# Patient Record
Sex: Female | Born: 1985
Health system: Southern US, Community
[De-identification: ages and names within clinical notes are randomized; demographics above are authoritative.]

## PROBLEM LIST (undated history)

## (undated) DIAGNOSIS — I872 Venous insufficiency (chronic) (peripheral): Secondary | ICD-10-CM

## (undated) DIAGNOSIS — F419 Anxiety disorder, unspecified: Secondary | ICD-10-CM

## (undated) DIAGNOSIS — I10 Essential (primary) hypertension: Secondary | ICD-10-CM

## (undated) DIAGNOSIS — R1319 Other dysphagia: Secondary | ICD-10-CM

## (undated) DIAGNOSIS — M722 Plantar fascial fibromatosis: Secondary | ICD-10-CM

## (undated) DIAGNOSIS — E119 Type 2 diabetes mellitus without complications: Secondary | ICD-10-CM

## (undated) DIAGNOSIS — K219 Gastro-esophageal reflux disease without esophagitis: Secondary | ICD-10-CM

## (undated) HISTORY — PX: PLANTAR FASCIA RELEASE: SHX2239

## (undated) HISTORY — DX: Venous insufficiency (chronic) (peripheral): I87.2

## (undated) HISTORY — DX: Other dysphagia: R13.19

## (undated) HISTORY — DX: Type 2 diabetes mellitus without complications: E11.9

## (undated) HISTORY — PX: OTHER SURGICAL HISTORY: SHX169

## (undated) HISTORY — DX: Essential (primary) hypertension: I10

## (undated) HISTORY — DX: Anxiety disorder, unspecified: F41.9

## (undated) HISTORY — DX: Gastro-esophageal reflux disease without esophagitis: K21.9

## (undated) HISTORY — DX: Plantar fascial fibromatosis: M72.2

---

## 2010-06-09 ENCOUNTER — Other Ambulatory Visit: Payer: Self-pay | Admitting: Obstetrics and Gynecology

## 2010-06-09 DIAGNOSIS — N63 Unspecified lump in unspecified breast: Secondary | ICD-10-CM

## 2010-06-14 ENCOUNTER — Ambulatory Visit
Admission: RE | Admit: 2010-06-14 | Discharge: 2010-06-14 | Disposition: A | Discharge status: Home / Self Care | Payer: 59 | Source: Ambulatory Visit | Attending: Obstetrics and Gynecology | Admitting: Obstetrics and Gynecology

## 2010-06-14 DIAGNOSIS — N63 Unspecified lump in unspecified breast: Secondary | ICD-10-CM

## 2011-01-16 HISTORY — PX: WISDOM TOOTH EXTRACTION: SHX21

## 2013-02-10 ENCOUNTER — Other Ambulatory Visit (HOSPITAL_COMMUNITY): Payer: Self-pay | Admitting: Orthopedic Surgery

## 2013-02-10 DIAGNOSIS — M25562 Pain in left knee: Secondary | ICD-10-CM

## 2013-02-13 ENCOUNTER — Ambulatory Visit (HOSPITAL_COMMUNITY)
Admission: RE | Admit: 2013-02-13 | Discharge: 2013-02-13 | Disposition: A | Discharge status: Home / Self Care | Payer: 59 | Source: Ambulatory Visit | Attending: Orthopedic Surgery | Admitting: Orthopedic Surgery

## 2013-02-13 DIAGNOSIS — M25569 Pain in unspecified knee: Secondary | ICD-10-CM | POA: Insufficient documentation

## 2013-02-13 DIAGNOSIS — M25562 Pain in left knee: Secondary | ICD-10-CM

## 2013-02-13 DIAGNOSIS — M25469 Effusion, unspecified knee: Secondary | ICD-10-CM | POA: Insufficient documentation

## 2015-01-21 MED FILL — ATENOLOL 100 MG TABLET: 100 | 90 days supply | Qty: 90 | Fill #0 | Status: TO

## 2015-03-15 MED FILL — D-AMPHETAMINE ER 15 MG CAP: 15 | 30 days supply | Qty: 60 | Fill #0

## 2015-04-27 MED FILL — ATENOLOL 100 MG TABLET: 100 | 30 days supply | Qty: 30 | Fill #0

## 2015-05-19 DIAGNOSIS — E669 Obesity, unspecified: Secondary | ICD-10-CM | POA: Insufficient documentation

## 2015-05-19 DIAGNOSIS — N970 Female infertility associated with anovulation: Secondary | ICD-10-CM | POA: Diagnosis not present

## 2015-05-19 DIAGNOSIS — F419 Anxiety disorder, unspecified: Secondary | ICD-10-CM | POA: Insufficient documentation

## 2015-05-19 DIAGNOSIS — F411 Generalized anxiety disorder: Secondary | ICD-10-CM | POA: Insufficient documentation

## 2015-05-19 DIAGNOSIS — I10 Essential (primary) hypertension: Secondary | ICD-10-CM | POA: Diagnosis not present

## 2015-05-19 DIAGNOSIS — Z13220 Encounter for screening for lipoid disorders: Secondary | ICD-10-CM | POA: Diagnosis not present

## 2015-05-19 DIAGNOSIS — Z7689 Persons encountering health services in other specified circumstances: Secondary | ICD-10-CM | POA: Diagnosis not present

## 2015-05-19 HISTORY — DX: Female infertility associated with anovulation: N97.0

## 2015-05-19 MED FILL — SERTRALINE HCL 50 MG TABLET: 50 | 30 days supply | Qty: 30 | Fill #0

## 2015-05-19 MED FILL — metFORMIN HCL 500 MG TABS: 500 | 30 days supply | Qty: 60 | Fill #0

## 2015-05-20 MED FILL — ATENOLOL 100 MG TABLET: 100 | 90 days supply | Qty: 90 | Fill #0

## 2015-05-23 MED FILL — D-AMPHETAMINE ER 15 MG CAP: 15 | 90 days supply | Qty: 90 | Fill #0

## 2015-07-26 DIAGNOSIS — N951 Menopausal and female climacteric states: Secondary | ICD-10-CM | POA: Diagnosis not present

## 2015-07-26 DIAGNOSIS — R635 Abnormal weight gain: Secondary | ICD-10-CM | POA: Diagnosis not present

## 2015-08-02 DIAGNOSIS — E7800 Pure hypercholesterolemia, unspecified: Secondary | ICD-10-CM | POA: Diagnosis not present

## 2015-08-02 DIAGNOSIS — I10 Essential (primary) hypertension: Secondary | ICD-10-CM | POA: Diagnosis not present

## 2015-08-02 DIAGNOSIS — E538 Deficiency of other specified B group vitamins: Secondary | ICD-10-CM | POA: Diagnosis not present

## 2015-08-09 DIAGNOSIS — E538 Deficiency of other specified B group vitamins: Secondary | ICD-10-CM | POA: Diagnosis not present

## 2015-08-09 DIAGNOSIS — I10 Essential (primary) hypertension: Secondary | ICD-10-CM | POA: Diagnosis not present

## 2015-08-09 DIAGNOSIS — R7303 Prediabetes: Secondary | ICD-10-CM | POA: Diagnosis not present

## 2015-08-12 MED FILL — SERTRALINE HCL 50 MG TABLET: 50 | 30 days supply | Qty: 30 | Fill #1

## 2015-08-12 MED FILL — metFORMIN HCL 500 MG TABS: 500 | 30 days supply | Qty: 60 | Fill #1

## 2015-08-23 DIAGNOSIS — E7800 Pure hypercholesterolemia, unspecified: Secondary | ICD-10-CM | POA: Diagnosis not present

## 2015-08-23 DIAGNOSIS — I10 Essential (primary) hypertension: Secondary | ICD-10-CM | POA: Diagnosis not present

## 2015-08-23 DIAGNOSIS — E538 Deficiency of other specified B group vitamins: Secondary | ICD-10-CM | POA: Diagnosis not present

## 2015-08-30 DIAGNOSIS — R7303 Prediabetes: Secondary | ICD-10-CM | POA: Diagnosis not present

## 2015-08-30 DIAGNOSIS — E538 Deficiency of other specified B group vitamins: Secondary | ICD-10-CM | POA: Diagnosis not present

## 2015-08-30 DIAGNOSIS — E7800 Pure hypercholesterolemia, unspecified: Secondary | ICD-10-CM | POA: Diagnosis not present

## 2015-08-30 MED FILL — ATENOLOL 100 MG TABLET: 100 | 90 days supply | Qty: 90 | Fill #1

## 2015-09-02 DIAGNOSIS — I10 Essential (primary) hypertension: Secondary | ICD-10-CM | POA: Diagnosis not present

## 2015-09-02 DIAGNOSIS — F909 Attention-deficit hyperactivity disorder, unspecified type: Secondary | ICD-10-CM | POA: Insufficient documentation

## 2015-09-02 DIAGNOSIS — G47429 Narcolepsy in conditions classified elsewhere without cataplexy: Secondary | ICD-10-CM | POA: Insufficient documentation

## 2015-09-02 DIAGNOSIS — F411 Generalized anxiety disorder: Secondary | ICD-10-CM | POA: Diagnosis not present

## 2015-09-02 MED FILL — METHYLDOPA 250 MG TABLET: 250 | 30 days supply | Qty: 60 | Fill #0

## 2015-09-02 MED FILL — SERTRALINE HCL 50 MG TABLET: 50 | 30 days supply | Qty: 60 | Fill #0

## 2015-09-06 MED FILL — D-AMPHETAMINE ER 15 MG CAP: 15 | 14 days supply | Qty: 14 | Fill #0

## 2015-09-13 DIAGNOSIS — E538 Deficiency of other specified B group vitamins: Secondary | ICD-10-CM | POA: Diagnosis not present

## 2015-09-13 DIAGNOSIS — E7800 Pure hypercholesterolemia, unspecified: Secondary | ICD-10-CM | POA: Diagnosis not present

## 2015-09-13 DIAGNOSIS — I10 Essential (primary) hypertension: Secondary | ICD-10-CM | POA: Diagnosis not present

## 2015-09-16 DIAGNOSIS — G47429 Narcolepsy in conditions classified elsewhere without cataplexy: Secondary | ICD-10-CM | POA: Diagnosis not present

## 2015-09-16 DIAGNOSIS — I10 Essential (primary) hypertension: Secondary | ICD-10-CM | POA: Diagnosis not present

## 2015-09-20 MED FILL — D-AMPHETAMINE ER 15 MG CAP: 15 | 21 days supply | Qty: 21 | Fill #0

## 2015-09-22 MED FILL — METHYLDOPA 250 MG TABLET: 250 | 30 days supply | Qty: 180 | Fill #0

## 2015-09-23 DIAGNOSIS — N979 Female infertility, unspecified: Secondary | ICD-10-CM | POA: Diagnosis not present

## 2015-09-30 DIAGNOSIS — F411 Generalized anxiety disorder: Secondary | ICD-10-CM | POA: Diagnosis not present

## 2015-09-30 DIAGNOSIS — I10 Essential (primary) hypertension: Secondary | ICD-10-CM | POA: Diagnosis not present

## 2015-09-30 MED FILL — METOPROLOL SUCC ER 100 MG T: 100 | 30 days supply | Qty: 30 | Fill #0 | Status: TO

## 2015-10-06 DIAGNOSIS — G47419 Narcolepsy without cataplexy: Secondary | ICD-10-CM | POA: Insufficient documentation

## 2015-10-06 DIAGNOSIS — F411 Generalized anxiety disorder: Secondary | ICD-10-CM | POA: Diagnosis not present

## 2015-10-06 DIAGNOSIS — R0683 Snoring: Secondary | ICD-10-CM | POA: Insufficient documentation

## 2015-10-06 DIAGNOSIS — G4719 Other hypersomnia: Secondary | ICD-10-CM | POA: Insufficient documentation

## 2015-10-06 DIAGNOSIS — F988 Other specified behavioral and emotional disorders with onset usually occurring in childhood and adolescence: Secondary | ICD-10-CM | POA: Insufficient documentation

## 2015-10-06 DIAGNOSIS — G47429 Narcolepsy in conditions classified elsewhere without cataplexy: Secondary | ICD-10-CM | POA: Diagnosis not present

## 2015-10-06 MED FILL — D-AMPHETAMINE ER 15 MG CAP: 15 | 30 days supply | Qty: 30 | Fill #0

## 2015-10-10 DIAGNOSIS — N979 Female infertility, unspecified: Secondary | ICD-10-CM | POA: Diagnosis not present

## 2015-10-12 ENCOUNTER — Other Ambulatory Visit (HOSPITAL_BASED_OUTPATIENT_CLINIC_OR_DEPARTMENT_OTHER): Payer: Self-pay

## 2015-10-12 DIAGNOSIS — R5383 Other fatigue: Secondary | ICD-10-CM

## 2015-10-12 DIAGNOSIS — G478 Other sleep disorders: Secondary | ICD-10-CM

## 2015-10-12 DIAGNOSIS — R0683 Snoring: Secondary | ICD-10-CM

## 2015-10-12 DIAGNOSIS — G47419 Narcolepsy without cataplexy: Secondary | ICD-10-CM

## 2015-10-12 DIAGNOSIS — G4710 Hypersomnia, unspecified: Secondary | ICD-10-CM

## 2015-10-12 NOTE — Progress Notes (Signed)
Multi

## 2015-10-14 DIAGNOSIS — I10 Essential (primary) hypertension: Secondary | ICD-10-CM | POA: Diagnosis not present

## 2015-10-18 MED FILL — AMLODIPINE BESYLATE 5 MG TA: 5 | 30 days supply | Qty: 30 | Fill #0

## 2015-10-21 ENCOUNTER — Other Ambulatory Visit (HOSPITAL_COMMUNITY): Payer: Self-pay | Admitting: Obstetrics & Gynecology

## 2015-10-21 DIAGNOSIS — Z3141 Encounter for fertility testing: Secondary | ICD-10-CM

## 2015-10-28 ENCOUNTER — Encounter (INDEPENDENT_AMBULATORY_CARE_PROVIDER_SITE_OTHER): Payer: Self-pay

## 2015-10-28 ENCOUNTER — Ambulatory Visit (HOSPITAL_COMMUNITY): Payer: 59

## 2015-10-28 ENCOUNTER — Ambulatory Visit (HOSPITAL_COMMUNITY)
Admission: RE | Admit: 2015-10-28 | Discharge: 2015-10-28 | Disposition: A | Discharge status: Home / Self Care | Payer: 59 | Source: Ambulatory Visit | Attending: Obstetrics & Gynecology | Admitting: Obstetrics & Gynecology

## 2015-10-28 DIAGNOSIS — Z3141 Encounter for fertility testing: Secondary | ICD-10-CM | POA: Insufficient documentation

## 2015-10-28 MED ORDER — IOPAMIDOL (ISOVUE-300) INJECTION 61%
30.0000 mL | Freq: Once | INTRAVENOUS | Status: AC | PRN
  Administered 2015-10-28: 3 mL

## 2015-10-31 MED FILL — METOPROLOL SUCC ER 100 MG T: 100 | 30 days supply | Qty: 30 | Fill #0

## 2015-11-16 DIAGNOSIS — N979 Female infertility, unspecified: Secondary | ICD-10-CM | POA: Diagnosis not present

## 2015-11-16 MED FILL — CLOMIPHENE CITRATE 50 MG TA: 50 | 5 days supply | Qty: 5 | Fill #0

## 2015-11-17 MED FILL — SERTRALINE HCL 50 MG TABLET: 50 | 30 days supply | Qty: 60 | Fill #1

## 2015-11-17 MED FILL — AMLODIPINE BESYLATE 5 MG TA: 5 | 30 days supply | Qty: 30 | Fill #1

## 2015-11-21 ENCOUNTER — Ambulatory Visit (HOSPITAL_BASED_OUTPATIENT_CLINIC_OR_DEPARTMENT_OTHER): Payer: 59 | Attending: Internal Medicine | Admitting: Internal Medicine

## 2015-11-21 DIAGNOSIS — G4753 Recurrent isolated sleep paralysis: Secondary | ICD-10-CM | POA: Insufficient documentation

## 2015-11-21 DIAGNOSIS — G47419 Narcolepsy without cataplexy: Secondary | ICD-10-CM | POA: Diagnosis not present

## 2015-11-21 DIAGNOSIS — G478 Other sleep disorders: Secondary | ICD-10-CM

## 2015-11-21 DIAGNOSIS — R5383 Other fatigue: Secondary | ICD-10-CM | POA: Diagnosis not present

## 2015-11-21 DIAGNOSIS — R400 Somnolence: Secondary | ICD-10-CM | POA: Diagnosis not present

## 2015-11-21 DIAGNOSIS — R0683 Snoring: Secondary | ICD-10-CM | POA: Insufficient documentation

## 2015-11-21 DIAGNOSIS — G4710 Hypersomnia, unspecified: Secondary | ICD-10-CM

## 2015-11-22 ENCOUNTER — Ambulatory Visit (HOSPITAL_BASED_OUTPATIENT_CLINIC_OR_DEPARTMENT_OTHER): Payer: 59 | Attending: Internal Medicine | Admitting: Internal Medicine

## 2015-11-22 DIAGNOSIS — R5383 Other fatigue: Secondary | ICD-10-CM

## 2015-11-22 DIAGNOSIS — R0683 Snoring: Secondary | ICD-10-CM

## 2015-11-22 DIAGNOSIS — G47419 Narcolepsy without cataplexy: Secondary | ICD-10-CM

## 2015-11-22 DIAGNOSIS — G478 Other sleep disorders: Secondary | ICD-10-CM

## 2015-11-22 DIAGNOSIS — G4710 Hypersomnia, unspecified: Secondary | ICD-10-CM

## 2015-11-27 DIAGNOSIS — R0683 Snoring: Secondary | ICD-10-CM

## 2015-11-27 NOTE — Procedures (Signed)
  Patient Name: Cheryl Ayala, [place] Date: 11/21/2015 Gender: Female D.O.B: [birth year] Age (years): 30 Referring Provider: Abner Greenspan MD Height (inches): 67 Interpreting Physician: Baird Lyons MD, ABSM Weight (lbs): 284 RPSGT: [place] BMI: 44 MRN: [id number] Neck Size: 16.00 CLINICAL INFORMATION Sleep Study Type: NPSG Indication for sleep study: Daytime Fatigue, Fatigue, Snoring Epworth Sleepiness Score: 14  SLEEP STUDY TECHNIQUE As per the AASM Manual for the Scoring of Sleep and Associated Events v2.3 (April 2016) with a hypopnea requiring 4% desaturations. The channels recorded and monitored were frontal, central and occipital EEG, electrooculogram (EOG), submentalis EMG (chin), nasal and oral airflow, thoracic and abdominal wall motion, anterior tibialis EMG, snore microphone, electrocardiogram, and pulse oximetry.  MEDICATIONS Medications self-administered by patient taken the night of the study : none reported  SLEEP ARCHITECTURE The study was initiated at 9:57:29 PM and ended at 6:00:24 AM. Sleep onset time was 22.8 minutes and the sleep efficiency was 87.2%. The total sleep time was 421.2 minutes. Stage REM latency was 161.0 minutes. The patient spent 1.78% of the night in stage N1 sleep, 85.04% in stage N2 sleep, 0.00% in stage N3 and 13.18% in REM. Alpha intrusion was absent. Supine sleep was 29.95%. Wake after sleep onset 39 minutes  RESPIRATORY PARAMETERS The overall apnea/hypopnea index (AHI) was 1.6 per hour. There were 2 total apneas, including 2 obstructive, 0 central and 0 mixed apneas. There were 9 hypopneas and 2 RERAs. The AHI during Stage REM sleep was 9.7 per hour. AHI while supine was 0.5 per hour. The mean oxygen saturation was 94.55%. The minimum SpO2 during sleep was 84.00%. Moderate snoring was noted during this study.  CARDIAC DATA The 2 lead EKG demonstrated sinus rhythm. The mean heart rate was 64.49 beats per minute.  Other EKG findings include: None.  LEG MOVEMENT DATA The total PLMS were 0 with a resulting PLMS index of 0.00. Associated arousal with leg movement index was 0.0 .  IMPRESSIONS - No significant obstructive sleep apnea occurred during this study (AHI = 1.6/h). - No significant central sleep apnea occurred during this study (CAI = 0.0/h). - Mild oxygen desaturation was noted during this study (Min O2 = 84.00%). - The patient snored with Moderate snoring volume. - No cardiac abnormalities were noted during this study. - Clinically significant periodic limb movements did not occur during sleep. No significant associated arousals.  DIAGNOSIS - Primary Snoring (786.09 [R06.83 ICD-10])  RECOMMENDATIONS - Avoid alcohol, sedatives and other CNS depressants that may worsen sleep apnea and disrupt normal sleep architecture. - Sleep hygiene should be reviewed to assess factors that may improve sleep quality. - Weight management and regular exercise should be initiated or continued if appropriate.  [Electronically signed] 11/27/2015 10:45 AM  Baird Lyons MD, ABSM Diplomate, American Board of Sleep Medicine   NPI: [id number]  [place], American Board of Sleep Medicine  ELECTRONICALLY SIGNED ON:  11/27/2015, 10:42 AM [place] PH: [phone]   FX: [phone] [place]

## 2015-11-27 NOTE — Procedures (Signed)
    Patient Name: Cheryl Ayala, Cheryl Ayala Date: 11/22/2015 Gender: Female D.O.B: 1985-04-17 Age (years): 30 Referring Provider: Abner Greenspan MD Height (inches): 18 Interpreting Physician: Baird Lyons MD, ABSM Weight (lbs): 284 RPSGT: [place], [place] BMI: 44 MRN: [id number] Neck Size: 16.00 CLINICAL INFORMATION Sleep Study Type: MSLT The patient was referred to the sleep center for evaluation of daytime sleepiness. Epworth Sleepiness Score: 14  SLEEP STUDY TECHNIQUE A Multiple Sleep Latency Test was performed after an overnight polysomnogram according to the AASM scoring manual v2.3 (April 2016) and clinical guidelines. Five nap opportunities occurred over the course of the test which followed an overnight polysomnogram. The channels recorded and monitored were frontal, central, and occipital electroencephalography (EEG), right and left electrooculogram (EOG), chin electromyography (EMG), and electrocardiogram (EKG).  MEDICATIONS Medications taken by the patient : none reported Medications administered by patient during sleep study : No sleep medicine administered.  IMPRESSIONS - Total number of naps attempted: 5.00 . Total number of naps with sleep attained: 5.00. The Mean Sleep Latency was 0.55 minutes. There were No sleep-onset REM periods. - The patient appears to have pathologic sleepiness, evidenced by a short mean sleep latency (8 minutes or less) on this MSLT. - No sleep onset REMs were noted during this MSLT.  DIAGNOSIS - Pathologic Sleepiness (- [G47.10 ICD-10])  RECOMMENDATIONS - Return tp provider for management of pathologic sleepiness  [Electronically signed] 11/27/2015 11:00 AM  Baird Lyons MD, ABSM Diplomate, American Board of Sleep Medicine   NPI: [id number]  [place], American Board of Sleep Medicine  ELECTRONICALLY SIGNED ON:  11/27/2015, 10:51 AM [place] PH: [phone]   FX: [phone]  [phone] [place]

## 2015-11-28 MED FILL — METOPROLOL SUCC ER 100 MG T: 100 | 30 days supply | Qty: 30 | Fill #0

## 2015-11-28 MED FILL — D-AMPHETAMINE ER 15 MG CAP: 15 | 30 days supply | Qty: 30 | Fill #0

## 2015-12-02 DIAGNOSIS — Z6841 Body Mass Index (BMI) 40.0 and over, adult: Secondary | ICD-10-CM | POA: Diagnosis not present

## 2015-12-02 DIAGNOSIS — Z01419 Encounter for gynecological examination (general) (routine) without abnormal findings: Secondary | ICD-10-CM | POA: Diagnosis not present

## 2015-12-16 DIAGNOSIS — J029 Acute pharyngitis, unspecified: Secondary | ICD-10-CM | POA: Diagnosis not present

## 2015-12-16 DIAGNOSIS — J019 Acute sinusitis, unspecified: Secondary | ICD-10-CM | POA: Diagnosis not present

## 2015-12-16 MED FILL — levoFLOXacin 500 MG TABS: 500 | 10 days supply | Qty: 10 | Fill #0

## 2015-12-23 DIAGNOSIS — F32A Depression, unspecified: Secondary | ICD-10-CM | POA: Insufficient documentation

## 2015-12-23 DIAGNOSIS — F902 Attention-deficit hyperactivity disorder, combined type: Secondary | ICD-10-CM | POA: Diagnosis not present

## 2015-12-23 DIAGNOSIS — G4710 Hypersomnia, unspecified: Secondary | ICD-10-CM | POA: Diagnosis not present

## 2015-12-23 DIAGNOSIS — F329 Major depressive disorder, single episode, unspecified: Secondary | ICD-10-CM | POA: Diagnosis not present

## 2015-12-23 DIAGNOSIS — G4719 Other hypersomnia: Secondary | ICD-10-CM | POA: Diagnosis not present

## 2015-12-23 DIAGNOSIS — F331 Major depressive disorder, recurrent, moderate: Secondary | ICD-10-CM | POA: Insufficient documentation

## 2015-12-23 HISTORY — DX: Hypersomnia, unspecified: G47.10

## 2015-12-26 MED FILL — AMLODIPINE BESYLATE 5 MG TA: 5 | 30 days supply | Qty: 30 | Fill #0 | Status: TO

## 2015-12-26 MED FILL — CLOMIPHENE CITRATE 50 MG TA: 50 | 5 days supply | Qty: 5 | Fill #0

## 2015-12-26 MED FILL — METOPROLOL SUCC ER 100 MG T: 100 | 30 days supply | Qty: 30 | Fill #1 | Status: TO

## 2016-01-02 MED FILL — D-AMPHETAMINE ER 15 MG CAP: 15 | 30 days supply | Qty: 30 | Fill #0

## 2016-01-27 MED FILL — AMLODIPINE BESYLATE 5 MG TA: 5 | 30 days supply | Qty: 30 | Fill #0

## 2016-01-27 MED FILL — METOPROLOL SUCC ER 100 MG T: 100 | 30 days supply | Qty: 30 | Fill #0

## 2016-01-27 MED FILL — SERTRALINE HCL 100 MG TAB: 100 | 90 days supply | Qty: 90 | Fill #0

## 2016-02-06 DIAGNOSIS — Z3149 Encounter for other procreative investigation and testing: Secondary | ICD-10-CM | POA: Diagnosis not present

## 2016-02-06 MED FILL — D-AMPHETAMINE ER 15 MG CAP: 15 | 30 days supply | Qty: 30 | Fill #0

## 2016-03-07 MED FILL — METOPROLOL SUCC ER 100 MG T: 100 | 30 days supply | Qty: 30 | Fill #1

## 2016-03-07 MED FILL — AMLODIPINE BESYLATE 5 MG TA: 5 | 30 days supply | Qty: 30 | Fill #0

## 2016-03-13 MED FILL — D-AMPHETAMINE ER 15 MG CAP: 15 | 30 days supply | Qty: 30 | Fill #0

## 2016-03-13 MED FILL — CLOMIPHENE CITRATE 50 MG TA: 50 | 30 days supply | Qty: 5 | Fill #0

## 2016-04-03 DIAGNOSIS — Z3149 Encounter for other procreative investigation and testing: Secondary | ICD-10-CM | POA: Diagnosis not present

## 2016-04-04 MED FILL — AMLODIPINE BESYLATE 5 MG TA: 5 | 30 days supply | Qty: 30 | Fill #0

## 2016-04-04 MED FILL — METOPROLOL SUCC ER 100 MG T: 100 | 30 days supply | Qty: 30 | Fill #0

## 2016-04-12 MED FILL — CLOMIPHENE CITRATE 50 MG TA: 50 | 5 days supply | Qty: 5 | Fill #0

## 2016-04-18 MED FILL — D-AMPHETAMINE ER 15 MG CAP: 15 | 30 days supply | Qty: 30 | Fill #0

## 2016-04-20 DIAGNOSIS — I10 Essential (primary) hypertension: Secondary | ICD-10-CM | POA: Diagnosis not present

## 2016-04-20 DIAGNOSIS — F411 Generalized anxiety disorder: Secondary | ICD-10-CM | POA: Diagnosis not present

## 2016-05-14 MED FILL — CLOMIPHENE CITRATE 50 MG TA: 50 | 30 days supply | Qty: 5 | Fill #0

## 2016-05-15 MED FILL — AMLODIPINE BESYLATE 5 MG TA: 5 | 90 days supply | Qty: 90 | Fill #0

## 2016-05-15 MED FILL — METOPROLOL SUCC ER 100 MG T: 100 | 90 days supply | Qty: 90 | Fill #0

## 2016-05-28 MED FILL — D-AMPHETAMINE ER 15 MG CAP: 15 | 30 days supply | Qty: 30 | Fill #0

## 2016-05-28 MED FILL — SERTRALINE HCL 100 MG TAB: 100 | 90 days supply | Qty: 90 | Fill #0

## 2016-06-22 DIAGNOSIS — G4710 Hypersomnia, unspecified: Secondary | ICD-10-CM | POA: Diagnosis not present

## 2016-06-28 MED FILL — D-AMPHETAMINE ER 15 MG CAP: 15 | 30 days supply | Qty: 30 | Fill #0

## 2016-07-05 DIAGNOSIS — N979 Female infertility, unspecified: Secondary | ICD-10-CM | POA: Diagnosis not present

## 2016-07-06 MED FILL — LETROZOLE 2.5 MG TABLET: 2.5 | 5 days supply | Qty: 15 | Fill #0 | Status: TO

## 2016-07-06 MED FILL — PREGNYL 10,000 UNITS VIAL: 10000 | 1 days supply | Qty: 1 | Fill #0

## 2016-07-17 DIAGNOSIS — Z3141 Encounter for fertility testing: Secondary | ICD-10-CM | POA: Diagnosis not present

## 2016-08-10 MED FILL — D-AMPHETAMINE ER 15 MG CAP: 15 | 30 days supply | Qty: 30 | Fill #0

## 2016-08-30 MED FILL — SERTRALINE HCL 100 MG TAB: 100 | 90 days supply | Qty: 90 | Fill #1

## 2016-08-30 MED FILL — LETROZOLE 2.5 MG TABLET: 2.5 | 5 days supply | Qty: 15 | Fill #0

## 2016-08-30 MED FILL — AMLODIPINE BESYLATE 5 MG TA: 5 | 90 days supply | Qty: 90 | Fill #1

## 2016-08-30 MED FILL — METOPROLOL SUCC ER 100 MG T: 100 | 90 days supply | Qty: 90 | Fill #1

## 2016-09-27 MED FILL — D-AMPHETAMINE ER 15 MG CAP: 15 | 30 days supply | Qty: 30 | Fill #0

## 2016-10-17 ENCOUNTER — Telehealth: Payer: Self-pay | Admitting: Allergy & Immunology

## 2016-10-17 MED ORDER — DOXYCYCLINE HYCLATE 100 MG PO TABS: 100.0000 mg | ORAL_TABLET | Freq: Two times a day (BID) | ORAL | 0 refills | Status: AC

## 2016-10-17 MED FILL — DOXYCYCLINE HYCLATE 100 MG: 100 | 10 days supply | Qty: 20 | Fill #0

## 2016-10-17 NOTE — Telephone Encounter (Signed)
Patient called me to discuss two weeks of chronic congestion that seems to be worsening. She has tried OTC treatments without improvement. Therefore, given the course, we will send in antibiotics: doxycyline 100mg  BID for ten days. Patient in agreement with the plan.  Salvatore Marvel, MD [facility] [place] [facility] [place]

## 2016-11-01 MED FILL — LETROZOLE 2.5 MG TABLET: 2.5 | 5 days supply | Qty: 15 | Fill #1

## 2016-11-07 MED FILL — D-AMPHETAMINE ER 15 MG CAP: 15 | 30 days supply | Qty: 30 | Fill #0

## 2016-11-30 MED FILL — SERTRALINE HCL 100 MG TAB: 100 | 30 days supply | Qty: 30 | Fill #0

## 2016-12-12 ENCOUNTER — Ambulatory Visit (INDEPENDENT_AMBULATORY_CARE_PROVIDER_SITE_OTHER): Payer: 59

## 2016-12-12 ENCOUNTER — Encounter: Payer: Self-pay | Admitting: Podiatry

## 2016-12-12 ENCOUNTER — Ambulatory Visit: Payer: 59 | Admitting: Podiatry

## 2016-12-12 DIAGNOSIS — M722 Plantar fascial fibromatosis: Secondary | ICD-10-CM

## 2016-12-12 MED ORDER — TRIAMCINOLONE ACETONIDE 10 MG/ML IJ SUSP
10.0000 mg | Freq: Once | INTRAMUSCULAR | Status: AC
  Administered 2016-12-12: 10 mg

## 2016-12-12 MED ORDER — DICLOFENAC SODIUM 75 MG PO TBEC: 75.0000 mg | DELAYED_RELEASE_TABLET | Freq: Two times a day (BID) | ORAL | 2 refills | Status: DC

## 2016-12-12 NOTE — Progress Notes (Signed)
Subjective:    Patient ID: Cheryl Ayala, female   DOB: 31 y.o.   MRN: [id number]   HPI patient presents stating she has a lot of pain in the bottom of both heels and that it's been going on for a fairly long period of time and has worsened in the left over right recently. Patient does not smoke is moderately obese and likes to be active    Review of Systems  All other systems reviewed and are negative.       Objective:  Physical Exam  Constitutional: She appears well-developed and well-nourished.  Cardiovascular: Intact distal pulses.  Pulmonary/Chest: Effort normal.  Musculoskeletal: Normal range of motion.  Neurological: She is alert.  Skin: Skin is warm.  Nursing note and vitals reviewed.  neurovascular status intact muscle strength adequate range of motion within normal limits with patient noted to have exquisite discomfort plantar aspect heel left over right with inflammation and fluid around the medial band     Assessment:   Acute plantar fasciitis bilateral with moderate depression of the arch      Plan:  H&P condition reviewed and injected the plantar fascia bilateral 3 mg Kenalog 5 mg Xylocaine and applied fascial brace bilateral and placed on diclofenac 75 mg twice a day and gave instructions on long-term orthotic therapy. Reappoint to recheck in 2 weeks  X-rays indicate there is spur formation left and right heel with no indication of stress fracture

## 2016-12-12 NOTE — Patient Instructions (Signed)

## 2016-12-14 MED FILL — D-AMPHETAMINE ER 15 MG CAP: 15 | 30 days supply | Qty: 30 | Fill #0

## 2016-12-14 MED FILL — DICLOFENAC SODIUM 75 MG TAB: 75 | 25 days supply | Qty: 50 | Fill #0

## 2016-12-14 MED FILL — AMLODIPINE BESYLATE 5 MG TA: 5 | 30 days supply | Qty: 30 | Fill #0

## 2016-12-14 MED FILL — METOPROLOL SUCC ER 100 MG T: 100 | 30 days supply | Qty: 30 | Fill #0

## 2016-12-26 ENCOUNTER — Ambulatory Visit (INDEPENDENT_AMBULATORY_CARE_PROVIDER_SITE_OTHER): Payer: 59 | Admitting: Podiatry

## 2016-12-26 ENCOUNTER — Encounter: Payer: Self-pay | Admitting: Podiatry

## 2016-12-26 DIAGNOSIS — M722 Plantar fascial fibromatosis: Secondary | ICD-10-CM

## 2016-12-27 NOTE — Progress Notes (Signed)
Subjective:   Patient ID: Cheryl Ayala, female   DOB: 31 y.o.   MRN: [id number]   HPI Patient states that she is quite a bit improved but she still has pain if she does too much and she has had this problem for a long time.   ROS      Objective:  Physical Exam  Neurovascular status intact with patient's foot much improved from previous visit with discomfort still upon deep palpation.     Assessment:  Chronic fasciitis with depression of the arch is complicating factor in young girl who has had this for a fairly long time.     Plan:  H&P and conditions reviewed with patient.  I recommended long-term orthotics in order to support the plantar arch and take stress off the heel and patient wants these made in a scan for customized orthotic devices at the current time

## 2016-12-28 DIAGNOSIS — G47429 Narcolepsy in conditions classified elsewhere without cataplexy: Secondary | ICD-10-CM | POA: Diagnosis not present

## 2016-12-28 DIAGNOSIS — F329 Major depressive disorder, single episode, unspecified: Secondary | ICD-10-CM | POA: Diagnosis not present

## 2016-12-28 DIAGNOSIS — E7800 Pure hypercholesterolemia, unspecified: Secondary | ICD-10-CM | POA: Diagnosis not present

## 2016-12-28 DIAGNOSIS — F909 Attention-deficit hyperactivity disorder, unspecified type: Secondary | ICD-10-CM | POA: Diagnosis not present

## 2016-12-28 DIAGNOSIS — I10 Essential (primary) hypertension: Secondary | ICD-10-CM | POA: Diagnosis not present

## 2016-12-28 DIAGNOSIS — E785 Hyperlipidemia, unspecified: Secondary | ICD-10-CM | POA: Diagnosis not present

## 2016-12-28 MED FILL — PREGNYL 10,000 UNITS VIAL: 10000 | 1 days supply | Qty: 1 | Fill #0

## 2017-01-01 DIAGNOSIS — Z3141 Encounter for fertility testing: Secondary | ICD-10-CM | POA: Diagnosis not present

## 2017-01-04 DIAGNOSIS — Z3169 Encounter for other general counseling and advice on procreation: Secondary | ICD-10-CM | POA: Diagnosis not present

## 2017-01-21 ENCOUNTER — Telehealth: Payer: Self-pay | Admitting: Podiatry

## 2017-01-21 NOTE — Telephone Encounter (Signed)
Pt left voicemail asking about orthotics.  Called pt back and made her aware that we have ordered them just waiting on them to come in and I would call when they do. Hoping for this week

## 2017-01-22 MED FILL — AMLODIPINE BESYLATE 5 MG TA: 5 | 90 days supply | Qty: 90 | Fill #0

## 2017-01-22 MED FILL — METOPROLOL SUCC ER 100 MG T: 100 | 90 days supply | Qty: 90 | Fill #0

## 2017-01-22 MED FILL — D-AMPHETAMINE ER 15 MG CAP: 15 | 30 days supply | Qty: 30 | Fill #0

## 2017-01-22 MED FILL — SERTRALINE HCL 100 MG TAB: 100 | 90 days supply | Qty: 135 | Fill #0

## 2017-02-05 ENCOUNTER — Ambulatory Visit: Payer: 59 | Admitting: Orthotics

## 2017-02-05 DIAGNOSIS — M722 Plantar fascial fibromatosis: Secondary | ICD-10-CM

## 2017-02-05 NOTE — Progress Notes (Signed)
Patient came in today to pick up custom made foot orthotics.  The goals were accomplished and the patient reported no dissatisfaction with said orthotics.  Patient was advised of breakin period and how to report any issues. 

## 2017-03-05 MED FILL — D-AMPHETAMINE ER 15 MG CAP: 15 | 30 days supply | Qty: 30 | Fill #0

## 2017-04-12 DIAGNOSIS — I10 Essential (primary) hypertension: Secondary | ICD-10-CM | POA: Diagnosis not present

## 2017-04-12 DIAGNOSIS — R609 Edema, unspecified: Secondary | ICD-10-CM | POA: Diagnosis not present

## 2017-04-12 DIAGNOSIS — G47429 Narcolepsy in conditions classified elsewhere without cataplexy: Secondary | ICD-10-CM | POA: Diagnosis not present

## 2017-04-12 MED FILL — HYDROCHLOROTHIAZIDE 12.5 MG: 12.5 | 30 days supply | Qty: 30 | Fill #0

## 2017-04-18 MED FILL — D-AMPHETAMINE ER 15 MG CAP: 15 | 30 days supply | Qty: 30 | Fill #0

## 2017-04-23 MED FILL — METOPROLOL SUCCINATE ER 100: 100 | 90 days supply | Qty: 90 | Fill #1

## 2017-04-23 MED FILL — AMLODIPINE BESYLATE 5 MG TA: 5 | 90 days supply | Qty: 90 | Fill #1

## 2017-04-24 DIAGNOSIS — I10 Essential (primary) hypertension: Secondary | ICD-10-CM | POA: Diagnosis not present

## 2017-04-24 DIAGNOSIS — E7800 Pure hypercholesterolemia, unspecified: Secondary | ICD-10-CM | POA: Diagnosis not present

## 2017-05-08 ENCOUNTER — Encounter: Payer: Self-pay | Admitting: Nurse Practitioner

## 2017-05-08 ENCOUNTER — Ambulatory Visit (INDEPENDENT_AMBULATORY_CARE_PROVIDER_SITE_OTHER): Payer: 59 | Admitting: Nurse Practitioner

## 2017-05-08 ENCOUNTER — Ambulatory Visit: Payer: Self-pay

## 2017-05-08 VITALS — BP 128/90 | HR 71 | Temp 98.2°F | Ht 67.0 in | Wt 306.8 lb

## 2017-05-08 DIAGNOSIS — R600 Localized edema: Secondary | ICD-10-CM | POA: Diagnosis not present

## 2017-05-08 DIAGNOSIS — I10 Essential (primary) hypertension: Secondary | ICD-10-CM

## 2017-05-08 MED ORDER — LABETALOL HCL 200 MG PO TABS: 200.0000 mg | ORAL_TABLET | Freq: Two times a day (BID) | ORAL | 0 refills | Status: DC

## 2017-05-08 MED FILL — FUROSEMIDE 20 MG TABS: 20 | 30 days supply | Qty: 30 | Fill #0

## 2017-05-08 MED FILL — LABETALOL HCL 200 MG TABLET: 200 | 14 days supply | Qty: 28 | Fill #0

## 2017-05-08 NOTE — Progress Notes (Signed)
Subjective:  Patient ID: Cheryl Ayala, female    DOB: 03-25-1985  Age: 32 y.o. MRN: [id number]  CC: Establish Care (4 months swelling in lower extremities...more so on left side. ...gets worse during the daytime hours.)  HPI  Reports labs last done 04/2017: CBC (normal), CMP (normal), lipid panel (abnormal). TSH last done 2017 (normal)  Bilateral LE edema: Onset 56months ago. Waxing and waning.  some improvements with elevation and use of compression stocking. No change with HCTZ 12.5mg . Furosemide was prescribed 3weeks ago, but did not start medication. Does not maintain low sodium diet. Does not exercise. Denies any change in medications in last 20months.  HTN: Controlled with use of metoprolol and amlodipine for several years. Use of atenolol in past. She is trying to get pregnant at this time. No hormone or fertility medication at this time per patient. BP Readings from Last 3 Encounters:  05/08/17 128/90   Hypersomnia: Sleep study done 2017: no narcolepsy, no sleep apnea. Last evaluated by neurology 06/2016: Dr. Rema Jasmine [facility]. Current use of dextroamphetamine 15mg  x 56yrs and daytime naps. Reports she takes 68mins nap in her car during weekdays.  Outpatient Medications Prior to Visit  Medication Sig Dispense Refill  . dextroamphetamine (DEXEDRINE SPANSULE) 15 MG 24 hr capsule Take by mouth.    . furosemide (LASIX) 20 MG tablet Take by mouth.    . sertraline (ZOLOFT) 100 MG tablet 1 1/2 tablets daily    . amLODipine (NORVASC) 5 MG tablet Take by mouth.    . hydrochlorothiazide (MICROZIDE) 12.5 MG capsule Take 12.5 mg by mouth daily.  0  . metoprolol succinate (TOPROL-XL) 100 MG 24 hr tablet Take by mouth.    . diclofenac (VOLTAREN) 75 MG EC tablet Take 1 tablet (75 mg total) by mouth 2 (two) times daily. (Patient not taking: Reported on 05/08/2017) 50 tablet 2  . human chorionic gonadotropin (PREGNYL/NOVAREL) 10000 units injection Inject  10,000 units when instructed by nurse or physician.     No facility-administered medications prior to visit.     ROS Review of Systems  Respiratory: Negative.  Negative for cough and shortness of breath.   Cardiovascular: Positive for leg swelling. Negative for chest pain and palpitations.  Musculoskeletal: Negative for falls, joint pain and myalgias.  Skin: Negative.   Neurological: Negative for sensory change and headaches.   Objective:  BP 128/90 (BP Location: Left Arm, Patient Position: Sitting, Cuff Size: Large)   Pulse 71   Temp 98.2 F (36.8 C) (Oral)   Ht 5\' 7"  (1.702 m)   Wt (!) 306 lb 12.8 oz (139.2 kg)   SpO2 98%   BMI 48.05 kg/m   BP Readings from Last 3 Encounters:  05/08/17 128/90    Wt Readings from Last 3 Encounters:  05/08/17 (!) 306 lb 12.8 oz (139.2 kg)  11/22/15 284 lb (128.8 kg)  11/21/15 284 lb (128.8 kg)    Physical Exam  Constitutional: She is oriented to person, place, and time. No distress.  Neck: Normal range of motion. Neck supple. No thyromegaly present.  Cardiovascular: Normal rate, regular rhythm, normal heart sounds and intact distal pulses.  Bilateral LE (knee to ankle) trace edema, non pitting.  Pulmonary/Chest: Effort normal and breath sounds normal.  Musculoskeletal: Normal range of motion.  Neurological: She is alert and oriented to person, place, and time.  Psychiatric: She has a normal mood and affect. Her behavior is normal. Thought content normal.  Vitals reviewed.   No results found  for: WBC, HGB, HCT, PLT, GLUCOSE, CHOL, TRIG, HDL, LDLDIRECT, LDLCALC, ALT, AST, NA, K, CL, CREATININE, BUN, CO2, TSH, PSA, INR, GLUF, HGBA1C, MICROALBUR  Dg Hysterogram (hsg)  Result Date: 10/28/2015 CLINICAL DATA:  Fertility testing EXAM: HYSTEROSALPINGOGRAM TECHNIQUE: Following cleansing of the cervix and vagina with Betadine solution, a hysterosalpingogram was performed using a 5-French hysterosalpingogram catheter and Omnipaque 300 contrast.  The patient tolerated the examination without difficulty. COMPARISON:  None. FLUOROSCOPY TIME:  Radiation Exposure Index (as provided by the fluoroscopic device): 24 seconds If the device does not provide the exposure index: Fluoroscopy Time:  dictate in minutes and seconds Number of Acquired Images:  3 FINDINGS: Endometrial cavity is normal. Both fallopian tubes fill and have a normal appearance. Normal spillage bilaterally. IMPRESSION: Normal study Electronically Signed   By: Rolm Baptise M.D.   On: 10/28/2015 13:54    Assessment & Plan:   Dannell was seen today for establish care.  Diagnoses and all orders for this visit:  Benign essential hypertension -     labetalol (NORMODYNE) 200 MG tablet; Take 1 tablet (200 mg total) by mouth 2 (two) times daily.  Bilateral leg edema   I have discontinued Cheryl Ayala's human chorionic gonadotropin, hydrochlorothiazide, amLODipine, and metoprolol succinate. I am also having her start on labetalol. Additionally, I am having her maintain her diclofenac, furosemide, sertraline, and dextroamphetamine.  Meds ordered this encounter  Medications  . labetalol (NORMODYNE) 200 MG tablet    Sig: Take 1 tablet (200 mg total) by mouth 2 (two) times daily.    Dispense:  28 tablet    Refill:  0    Order Specific Question:   Supervising Provider    Answer:   Lucille Passy [[id number]]    Follow-up: Return in about 1 week (around 05/15/2017) for HTN and edema.  Wilfred Lacy, NP

## 2017-05-08 NOTE — Telephone Encounter (Signed)
FYI

## 2017-05-08 NOTE — Telephone Encounter (Signed)
Pt. Reports she has lower extremity swelling and it is getting worse. Was seeing a provider,but is not happy with them. States the swelling starts at her feet and goes up to her knees. Reports it is "3+". Swelling goes down minimally with elevation. Denies any chest pain or shortness of breath. Appointment made for today - OK with flow coordinator.  Reason for Disposition . [1] MODERATE leg swelling (e.g., swelling extends up to knees) AND [2] new onset or worsening  Answer Assessment - Initial Assessment Questions 1. ONSET: "When did the swelling start?" (e.g., minutes, hours, days)     January 2019 2. LOCATION: "What part of the leg is swollen?"  "Are both legs swollen or just one leg?"     Stops at the knee 3. SEVERITY: "How bad is the swelling?" (e.g., localized; mild, moderate, severe)  - Localized - small area of swelling localized to one leg  - MILD pedal edema - swelling limited to foot and ankle, pitting edema < 1/4 inch (6 mm) deep, rest and elevation eliminate most or all swelling  - MODERATE edema - swelling of lower leg to knee, pitting edema > 1/4 inch (6 mm) deep, rest and elevation only partially reduce swelling  - SEVERE edema - swelling extends above knee, facial or hand swelling present      Moderate 4. REDNESS: "Does the swelling look red or infected?"     No 5. PAIN: "Is the swelling painful to touch?" If so, ask: "How painful is it?"   (Scale 1-10; mild, moderate or severe)     1 6. FEVER: "Do you have a fever?" If so, ask: "What is it, how was it measured, and when did it start?"      No 7. CAUSE: "What do you think is causing the leg swelling?"     Unsure 8. MEDICAL HISTORY: "Do you have a history of heart failure, kidney disease, liver failure, or cancer?"     No 9. RECURRENT SYMPTOM: "Have you had leg swelling before?" If so, ask: "When was the last time?" "What happened that time?"     No 10. OTHER SYMPTOMS: "Do you have any other symptoms?" (e.g., chest pain,  difficulty breathing)       No 11. PREGNANCY: "Is there any chance you are pregnant?" "When was your last menstrual period?"       No  Protocols used: LEG SWELLING AND EDEMA-A-AH

## 2017-05-08 NOTE — Patient Instructions (Addendum)
Wear compression stocking during day and off at night.  Check BP and heart rate once a day and record. Bring readings to next OV.  Stop amlodipine and metoprolol.  Bring copy of recent CBC, CMP, TSH, and lipid panel to next OV.  Use furosemide as prescribed.  DASH Eating Plan DASH stands for "Dietary Approaches to Stop Hypertension." The DASH eating plan is a healthy eating plan that has been shown to reduce high blood pressure (hypertension). It may also reduce your risk for type 2 diabetes, heart disease, and stroke. The DASH eating plan may also help with weight loss. What are tips for following this plan? General guidelines  Avoid eating more than 2,300 mg (milligrams) of salt (sodium) a day. If you have hypertension, you may need to reduce your sodium intake to 1,500 mg a day.  Limit alcohol intake to no more than 1 drink a day for nonpregnant women and 2 drinks a day for men. One drink equals 12 oz of beer, 5 oz of wine, or 1 oz of hard liquor.  Work with your health care provider to maintain a healthy body weight or to lose weight. Ask what an ideal weight is for you.  Get at least 30 minutes of exercise that causes your heart to beat faster (aerobic exercise) most days of the week. Activities may include walking, swimming, or biking.  Work with your health care provider or diet and nutrition specialist (dietitian) to adjust your eating plan to your individual calorie needs. Reading food labels  Check food labels for the amount of sodium per serving. Choose foods with less than 5 percent of the Daily Value of sodium. Generally, foods with less than 300 mg of sodium per serving fit into this eating plan.  To find whole grains, look for the word "whole" as the first word in the ingredient list. Shopping  Buy products labeled as "low-sodium" or "no salt added."  Buy fresh foods. Avoid canned foods and premade or frozen meals. Cooking  Avoid adding salt when cooking. Use  salt-free seasonings or herbs instead of table salt or sea salt. Check with your health care provider or pharmacist before using salt substitutes.  Do not fry foods. Cook foods using healthy methods such as baking, boiling, grilling, and broiling instead.  Cook with heart-healthy oils, such as olive, canola, soybean, or sunflower oil. Meal planning   Eat a balanced diet that includes: ? 5 or more servings of fruits and vegetables each day. At each meal, try to fill half of your plate with fruits and vegetables. ? Up to 6-8 servings of whole grains each day. ? Less than 6 oz of lean meat, poultry, or fish each day. A 3-oz serving of meat is about the same size as a deck of cards. One egg equals 1 oz. ? 2 servings of low-fat dairy each day. ? A serving of nuts, seeds, or beans 5 times each week. ? Heart-healthy fats. Healthy fats called Omega-3 fatty acids are found in foods such as flaxseeds and coldwater fish, like sardines, salmon, and mackerel.  Limit how much you eat of the following: ? Canned or prepackaged foods. ? Food that is high in trans fat, such as fried foods. ? Food that is high in saturated fat, such as fatty meat. ? Sweets, desserts, sugary drinks, and other foods with added sugar. ? Full-fat dairy products.  Do not salt foods before eating.  Try to eat at least 2 vegetarian meals each week.  Eat more home-cooked food and less restaurant, buffet, and fast food.  When eating at a restaurant, ask that your food be prepared with less salt or no salt, if possible. What foods are recommended? The items listed may not be a complete list. Talk with your dietitian about what dietary choices are best for you. Grains Whole-grain or whole-wheat bread. Whole-grain or whole-wheat pasta. Brown rice. [place]. Bulgur. Whole-grain and low-sodium cereals. Pita bread. Low-fat, low-sodium crackers. Whole-wheat flour tortillas. Vegetables Fresh or frozen vegetables (raw, steamed,  roasted, or grilled). Low-sodium or reduced-sodium tomato and vegetable juice. Low-sodium or reduced-sodium tomato sauce and tomato paste. Low-sodium or reduced-sodium canned vegetables. Fruits All fresh, dried, or frozen fruit. Canned fruit in natural juice (without added sugar). Meat and other protein foods Skinless chicken or [place]. Ground chicken or [place]. Pork with fat trimmed off. Fish and seafood. Egg whites. Dried beans, peas, or lentils. Unsalted nuts, nut butters, and seeds. Unsalted canned beans. Lean cuts of beef with fat trimmed off. Low-sodium, lean deli meat. Dairy Low-fat (1%) or fat-free (skim) milk. Fat-free, low-fat, or reduced-fat cheeses. Nonfat, low-sodium ricotta or cottage cheese. Low-fat or nonfat yogurt. Low-fat, low-sodium cheese. Fats and oils Soft margarine without trans fats. Vegetable oil. Low-fat, reduced-fat, or light mayonnaise and salad dressings (reduced-sodium). Canola, safflower, olive, soybean, and sunflower oils. Avocado. Seasoning and other foods Herbs. Spices. Seasoning mixes without salt. Unsalted popcorn and pretzels. Fat-free sweets. What foods are not recommended? The items listed may not be a complete list. Talk with your dietitian about what dietary choices are best for you. Grains Baked goods made with fat, such as croissants, muffins, or some breads. Dry pasta or rice meal packs. Vegetables Creamed or fried vegetables. Vegetables in a cheese sauce. Regular canned vegetables (not low-sodium or reduced-sodium). Regular canned tomato sauce and paste (not low-sodium or reduced-sodium). Regular tomato and vegetable juice (not low-sodium or reduced-sodium). Angie Fava. Olives. Fruits Canned fruit in a light or heavy syrup. Fried fruit. Fruit in cream or butter sauce. Meat and other protein foods Fatty cuts of meat. Ribs. Fried meat. Berniece Salines. Sausage. Bologna and other processed lunch meats. Salami. Fatback. Hotdogs. Bratwurst. Salted nuts and seeds. Canned  beans with added salt. Canned or smoked fish. Whole eggs or egg yolks. Chicken or [place] with skin. Dairy Whole or 2% milk, cream, and half-and-half. Whole or full-fat cream cheese. Whole-fat or sweetened yogurt. Full-fat cheese. Nondairy creamers. Whipped toppings. Processed cheese and cheese spreads. Fats and oils Butter. Stick margarine. Lard. Shortening. Ghee. Bacon fat. Tropical oils, such as coconut, palm kernel, or palm oil. Seasoning and other foods Salted popcorn and pretzels. Onion salt, garlic salt, seasoned salt, table salt, and sea salt. Worcestershire sauce. Tartar sauce. Barbecue sauce. Teriyaki sauce. Soy sauce, including reduced-sodium. Steak sauce. Canned and packaged gravies. Fish sauce. Oyster sauce. Cocktail sauce. Horseradish that you find on the shelf. Ketchup. Mustard. Meat flavorings and tenderizers. Bouillon cubes. Hot sauce and Tabasco sauce. Premade or packaged marinades. Premade or packaged taco seasonings. Relishes. Regular salad dressings. Where to find more information:  National Heart, Lung, and [place]: https://wilson-eaton.com/  American Heart Association: www.heart.org Summary  The DASH eating plan is a healthy eating plan that has been shown to reduce high blood pressure (hypertension). It may also reduce your risk for type 2 diabetes, heart disease, and stroke.  With the DASH eating plan, you should limit salt (sodium) intake to 2,300 mg a day. If you have hypertension, you may need to reduce your sodium intake  to 1,500 mg a day.  When on the DASH eating plan, aim to eat more fresh fruits and vegetables, whole grains, lean proteins, low-fat dairy, and heart-healthy fats.  Work with your health care provider or diet and nutrition specialist (dietitian) to adjust your eating plan to your individual calorie needs. This information is not intended to replace advice given to you by your health care provider. Make sure you discuss any questions you have with your  health care provider. Document Released: 12/21/2010 Document Revised: 12/26/2015 Document Reviewed: 12/26/2015 Elsevier Interactive Patient Education  [employer].

## 2017-05-15 ENCOUNTER — Encounter: Payer: Self-pay | Admitting: Nurse Practitioner

## 2017-05-15 ENCOUNTER — Ambulatory Visit (INDEPENDENT_AMBULATORY_CARE_PROVIDER_SITE_OTHER): Payer: 59 | Admitting: Nurse Practitioner

## 2017-05-15 VITALS — BP 132/88 | HR 81 | Temp 98.2°F | Ht 67.0 in | Wt 312.0 lb

## 2017-05-15 DIAGNOSIS — R635 Abnormal weight gain: Secondary | ICD-10-CM | POA: Diagnosis not present

## 2017-05-15 DIAGNOSIS — I10 Essential (primary) hypertension: Secondary | ICD-10-CM | POA: Diagnosis not present

## 2017-05-15 DIAGNOSIS — R600 Localized edema: Secondary | ICD-10-CM

## 2017-05-15 LAB — TSH: TSH: 3.22 u[IU]/mL (ref 0.450–4.500)

## 2017-05-15 LAB — COMPREHENSIVE METABOLIC PANEL
A/G RATIO: 1.7 (ref 1.2–2.2)
ALT: 17 IU/L (ref 0–32)
AST: 17 IU/L (ref 0–40)
Albumin: 3.9 g/dL (ref 3.5–5.5)
Alkaline Phosphatase: 48 IU/L (ref 39–117)
BILIRUBIN TOTAL: 0.2 mg/dL (ref 0.0–1.2)
BUN/Creatinine Ratio: 16 (ref 9–23)
BUN: 12 mg/dL (ref 6–20)
CHLORIDE: 104 mmol/L (ref 96–106)
CO2: 25 mmol/L (ref 20–29)
Calcium: 8.4 mg/dL — ABNORMAL LOW (ref 8.7–10.2)
Creatinine, Ser: 0.77 mg/dL (ref 0.57–1.00)
GFR calc Af Amer: 118 mL/min/{1.73_m2} (ref >59)
GFR calc non Af Amer: 102 mL/min/{1.73_m2} (ref >59)
GLUCOSE: 112 mg/dL — AB (ref 65–99)
Globulin, Total: 2.3 g/dL (ref 1.5–4.5)
POTASSIUM: 4.1 mmol/L (ref 3.5–5.2)
Sodium: 141 mmol/L (ref 134–144)
Total Protein: 6.2 g/dL (ref 6.0–8.5)

## 2017-05-15 LAB — BRAIN NATRIURETIC PEPTIDE: BNP: 42.8 pg/mL (ref 0.0–100.0)

## 2017-05-15 LAB — BETA HCG QUANT (REF LAB): hCG Quant: <1 m[IU]/mL

## 2017-05-15 MED ORDER — POTASSIUM CHLORIDE CRYS ER 10 MEQ PO TBCR: 10.0000 meq | EXTENDED_RELEASE_TABLET | Freq: Every day | ORAL | 0 refills | Status: DC

## 2017-05-15 MED ORDER — FUROSEMIDE 20 MG PO TABS: 20.0000 mg | ORAL_TABLET | Freq: Every day | ORAL | 0 refills | Status: DC

## 2017-05-15 NOTE — Patient Instructions (Addendum)
Normal lab results, including BNP. Sent additional furosemide. Entered echocardiogram order and referral to vascular clinic.  Continue DASH diet and use of compression stocking.

## 2017-05-15 NOTE — Progress Notes (Signed)
Subjective:  Patient ID: Cheryl Ayala, female    DOB: [birth year]  Age: 32 y.o. MRN: [id number]  CC: Follow-up (HTN and edema/not better,has BP reading--only has normal cup/)  HPI Bilateral LE edema:  Worsening LE edema and weight gain. No improvement with furosemide and medication change. Intermittent orthopnea. Wt Readings from Last 3 Encounters:  05/15/17 (!) 312 lb (141.5 kg)  05/08/17 (!) 306 lb 12.8 oz (139.2 kg)  11/22/15 284 lb (128.8 kg)   HTN:  Stable with labetalol Home BP readings: 132/96, 142/82, 134/88, 154/96. BP Readings from Last 3 Encounters:  05/15/17 132/88  05/08/17 128/90   Outpatient Medications Prior to Visit  Medication Sig Dispense Refill  . diclofenac (VOLTAREN) 75 MG EC tablet Take 1 tablet (75 mg total) by mouth 2 (two) times daily. 50 tablet 2  . labetalol (NORMODYNE) 200 MG tablet Take 1 tablet (200 mg total) by mouth 2 (two) times daily. 28 tablet 0  . sertraline (ZOLOFT) 100 MG tablet 1 1/2 tablets daily    . furosemide (LASIX) 20 MG tablet Take by mouth.    . dextroamphetamine (DEXEDRINE SPANSULE) 15 MG 24 hr capsule Take by mouth.     No facility-administered medications prior to visit.     ROS See HPI  Objective:  BP 132/88   Pulse 81   Temp 98.2 F (36.8 C)   Ht 5\' 7"  (1.702 m)   Wt (!) 312 lb (141.5 kg)   SpO2 98%   BMI 48.87 kg/m   BP Readings from Last 3 Encounters:  05/15/17 132/88  05/08/17 128/90    Wt Readings from Last 3 Encounters:  05/15/17 (!) 312 lb (141.5 kg)  05/08/17 (!) 306 lb 12.8 oz (139.2 kg)  11/22/15 284 lb (128.8 kg)    Physical Exam  Constitutional: She is oriented to person, place, and time. No distress.  Cardiovascular: Normal rate, regular rhythm, normal heart sounds and intact distal pulses.  Pulmonary/Chest: Effort normal and breath sounds normal. No respiratory distress.  Abdominal: Soft. Bowel sounds are normal.  Musculoskeletal: She exhibits edema.  Bilateral Pitting edema knee to  ankle (2+)  Neurological: She is alert and oriented to person, place, and time.  Psychiatric: She has a normal mood and affect. Her behavior is normal. Thought content normal.  Vitals reviewed.   Lab Results  Component Value Date   GLUCOSE 112 (H) 05/15/2017   ALT 17 05/15/2017   AST 17 05/15/2017   NA 141 05/15/2017   K 4.1 05/15/2017   CL 104 05/15/2017   CREATININE 0.77 05/15/2017   BUN 12 05/15/2017   CO2 25 05/15/2017   TSH 3.220 05/15/2017    Dg Hysterogram (hsg)  Result Date: 10/28/2015 CLINICAL DATA:  Fertility testing EXAM: HYSTEROSALPINGOGRAM TECHNIQUE: Following cleansing of the cervix and vagina with Betadine solution, a hysterosalpingogram was performed using a 5-French hysterosalpingogram catheter and Omnipaque 300 contrast. The patient tolerated the examination without difficulty. COMPARISON:  None. FLUOROSCOPY TIME:  Radiation Exposure Index (as provided by the fluoroscopic device): 24 seconds If the device does not provide the exposure index: Fluoroscopy Time:  dictate in minutes and seconds Number of Acquired Images:  3 FINDINGS: Endometrial cavity is normal. Both fallopian tubes fill and have a normal appearance. Normal spillage bilaterally. IMPRESSION: Normal study Electronically Signed   By: Rolm Baptise M.D.   On: 10/28/2015 13:54   Assessment & Plan:   Cheryl Ayala was seen today for follow-up.  Diagnoses and all orders for this visit:  Bilateral leg edema -     Cancel: Brain natriuretic peptide -     Cancel: TSH -     Cancel: Comprehensive metabolic panel -     Cancel: Brain natriuretic peptide -     TSH -     Comprehensive metabolic panel -     Beta hCG quant (ref lab) -     Brain natriuretic peptide -     Ambulatory referral to Vascular Surgery -     ECHOCARDIOGRAM LIMITED; Future -     furosemide (LASIX) 20 MG tablet; Take 1 tablet (20 mg total) by mouth daily. -     potassium chloride SA (K-DUR,KLOR-CON) 10 MEQ tablet; Take 1 tablet (10 mEq total) by  mouth daily.  Benign essential hypertension -     Cancel: Brain natriuretic peptide -     Cancel: TSH -     Cancel: Comprehensive metabolic panel -     Cancel: Brain natriuretic peptide -     TSH -     Comprehensive metabolic panel -     Beta hCG quant (ref lab) -     Brain natriuretic peptide  Excessive weight gain -     Cancel: Brain natriuretic peptide -     Cancel: TSH -     Cancel: Comprehensive metabolic panel -     Cancel: B-HCG Quant -     Cancel: Brain natriuretic peptide -     TSH -     Comprehensive metabolic panel -     Beta hCG quant (ref lab) -     Brain natriuretic peptide -     ECHOCARDIOGRAM LIMITED; Future  Other orders -     Brain natriuretic peptide   I have changed Cheryl Ayala's furosemide. I am also having her start on potassium chloride. Additionally, I am having her maintain her diclofenac, sertraline, dextroamphetamine, and labetalol.  Meds ordered this encounter  Medications  . furosemide (LASIX) 20 MG tablet    Sig: Take 1 tablet (20 mg total) by mouth daily.    Dispense:  30 tablet    Refill:  0    Order Specific Question:   Supervising Provider    Answer:   Lucille Passy [[id number]]  . potassium chloride SA (K-DUR,KLOR-CON) 10 MEQ tablet    Sig: Take 1 tablet (10 mEq total) by mouth daily.    Dispense:  30 tablet    Refill:  0    Order Specific Question:   Supervising Provider    Answer:   Lucille Passy [[id number]]    Follow-up: Return in about 3 weeks (around 06/05/2017) for HTN and edema.  Cheryl Lacy, NP

## 2017-05-23 ENCOUNTER — Encounter: Payer: Self-pay | Admitting: Nurse Practitioner

## 2017-05-23 ENCOUNTER — Other Ambulatory Visit: Payer: Self-pay | Admitting: Nurse Practitioner

## 2017-05-23 ENCOUNTER — Ambulatory Visit (HOSPITAL_COMMUNITY)
Admission: RE | Admit: 2017-05-23 | Discharge: 2017-05-23 | Disposition: A | Discharge status: Home / Self Care | Payer: 59 | Source: Ambulatory Visit | Attending: Nurse Practitioner | Admitting: Nurse Practitioner

## 2017-05-23 ENCOUNTER — Other Ambulatory Visit: Payer: Self-pay

## 2017-05-23 DIAGNOSIS — F988 Other specified behavioral and emotional disorders with onset usually occurring in childhood and adolescence: Secondary | ICD-10-CM

## 2017-05-23 DIAGNOSIS — G4719 Other hypersomnia: Secondary | ICD-10-CM

## 2017-05-23 DIAGNOSIS — I10 Essential (primary) hypertension: Secondary | ICD-10-CM | POA: Diagnosis not present

## 2017-05-23 DIAGNOSIS — R600 Localized edema: Secondary | ICD-10-CM

## 2017-05-23 DIAGNOSIS — R635 Abnormal weight gain: Secondary | ICD-10-CM

## 2017-05-23 MED ORDER — LABETALOL HCL 200 MG PO TABS: 200.0000 mg | ORAL_TABLET | Freq: Two times a day (BID) | ORAL | 0 refills | Status: DC

## 2017-05-23 MED FILL — LABETALOL HCL 200 MG TABLET: 200 | 14 days supply | Qty: 28 | Fill #0

## 2017-05-23 NOTE — Progress Notes (Signed)
  Echocardiogram 2D Echocardiogram has been performed.  [name] 05/23/2017, 8:57 AM

## 2017-05-24 ENCOUNTER — Ambulatory Visit: Payer: 59 | Admitting: Family Medicine

## 2017-05-24 MED ORDER — DEXTROAMPHETAMINE SULFATE ER 15 MG PO CP24: 15.0000 mg | ORAL_CAPSULE | Freq: Every day | ORAL | 0 refills | Status: DC

## 2017-05-24 NOTE — Telephone Encounter (Signed)
Reviewed PMP database. Dextroamphetamine last filled 04/18/2017 by Dr. Maudry Diego.

## 2017-05-28 ENCOUNTER — Encounter: Payer: Self-pay | Admitting: Nurse Practitioner

## 2017-05-28 ENCOUNTER — Other Ambulatory Visit: Payer: Self-pay | Admitting: Nurse Practitioner

## 2017-05-28 DIAGNOSIS — R600 Localized edema: Secondary | ICD-10-CM

## 2017-05-28 MED ORDER — FUROSEMIDE 20 MG PO TABS: 20.0000 mg | ORAL_TABLET | Freq: Every day | ORAL | 0 refills | Status: DC

## 2017-05-28 MED FILL — D-AMPHETAMINE ER 15 MG CAP: 15 | 30 days supply | Qty: 30 | Fill #0

## 2017-05-28 MED FILL — POTASSIUM CL 10 MEQ TAB SA: 10 | 30 days supply | Qty: 30 | Fill #0

## 2017-06-03 MED FILL — FUROSEMIDE 20 MG TABS: 20 | 30 days supply | Qty: 30 | Fill #0

## 2017-06-04 ENCOUNTER — Encounter: Payer: Self-pay | Admitting: Nurse Practitioner

## 2017-06-05 DIAGNOSIS — R600 Localized edema: Secondary | ICD-10-CM | POA: Diagnosis not present

## 2017-06-07 ENCOUNTER — Encounter: Payer: Self-pay | Admitting: Nurse Practitioner

## 2017-06-07 ENCOUNTER — Ambulatory Visit (INDEPENDENT_AMBULATORY_CARE_PROVIDER_SITE_OTHER): Payer: 59 | Admitting: Nurse Practitioner

## 2017-06-07 VITALS — BP 150/100 | HR 87 | Temp 98.4°F | Ht 67.0 in | Wt 314.0 lb

## 2017-06-07 DIAGNOSIS — R635 Abnormal weight gain: Secondary | ICD-10-CM | POA: Diagnosis not present

## 2017-06-07 DIAGNOSIS — R600 Localized edema: Secondary | ICD-10-CM | POA: Diagnosis not present

## 2017-06-07 DIAGNOSIS — G4719 Other hypersomnia: Secondary | ICD-10-CM | POA: Diagnosis not present

## 2017-06-07 DIAGNOSIS — Z79899 Other long term (current) drug therapy: Secondary | ICD-10-CM | POA: Diagnosis not present

## 2017-06-07 DIAGNOSIS — Z8371 Family history of colonic polyps: Secondary | ICD-10-CM | POA: Diagnosis not present

## 2017-06-07 DIAGNOSIS — F988 Other specified behavioral and emotional disorders with onset usually occurring in childhood and adolescence: Secondary | ICD-10-CM

## 2017-06-07 DIAGNOSIS — R609 Edema, unspecified: Secondary | ICD-10-CM

## 2017-06-07 DIAGNOSIS — I10 Essential (primary) hypertension: Secondary | ICD-10-CM | POA: Diagnosis not present

## 2017-06-07 MED ORDER — LABETALOL HCL 200 MG PO TABS: 200.0000 mg | ORAL_TABLET | Freq: Two times a day (BID) | ORAL | 2 refills | Status: DC

## 2017-06-07 MED ORDER — DEXTROAMPHETAMINE SULFATE ER 15 MG PO CP24: 15.0000 mg | ORAL_CAPSULE | Freq: Every day | ORAL | 0 refills | Status: DC

## 2017-06-07 MED ORDER — FUROSEMIDE 20 MG PO TABS: 20.0000 mg | ORAL_TABLET | Freq: Every day | ORAL | 2 refills | Status: DC

## 2017-06-07 MED ORDER — POTASSIUM CHLORIDE CRYS ER 10 MEQ PO TBCR: 10.0000 meq | EXTENDED_RELEASE_TABLET | Freq: Every day | ORAL | 2 refills | Status: DC

## 2017-06-07 MED FILL — LABETALOL HCL 200 MG TABLET: 200 | 30 days supply | Qty: 60 | Fill #0

## 2017-06-07 NOTE — Progress Notes (Signed)
Subjective:  Patient ID: Cheryl Ayala, female    DOB: Dec 30, 1985  Age: 32 y.o. MRN: [id number]  CC: Follow-up (HTN and edema. swelling is the same--had [place] scheudle 06/28/2017-req urine preg test--4 days late--gave urine for UDS (very little). )  HPI   ADD and hypersomnia: Stable with current dose of dexedrine. She agrees to sign controlled substance contract and provide UDS.  HTN: Elevated today. Does not check BP at home. Current use of labetalol BID. declined spironolactone/HCTZ or lisinopril Rx due to possible teratogenic effects if she gets pregnant. BP Readings from Last 3 Encounters:  06/07/17 (!) 150/100  05/15/17 132/88  05/08/17 128/90   Edema: Persistent despite use of furosemide, low sodium diet and compression stocking. Also has continuous weight gain. Normal echocardiogram and recent lab results.  Reports FHx of precancerous colon polyp (mother at age 9), states early colonoscopy was recommended for her, so she will like a referral to GI. She denies any GI problems at this time.  Outpatient Medications Prior to Visit  Medication Sig Dispense Refill  . diclofenac (VOLTAREN) 75 MG EC tablet Take 1 tablet (75 mg total) by mouth 2 (two) times daily. 50 tablet 2  . sertraline (ZOLOFT) 100 MG tablet 1 1/2 tablets daily    . dextroamphetamine (DEXEDRINE SPANSULE) 15 MG 24 hr capsule Take 1 capsule (15 mg total) by mouth daily. 30 capsule 0  . furosemide (LASIX) 20 MG tablet Take 1 tablet (20 mg total) by mouth daily. 10 tablet 0  . labetalol (NORMODYNE) 200 MG tablet Take 1 tablet (200 mg total) by mouth 2 (two) times daily. 28 tablet 0  . potassium chloride SA (K-DUR,KLOR-CON) 10 MEQ tablet Take 1 tablet (10 mEq total) by mouth daily. (Patient not taking: Reported on 06/07/2017) 30 tablet 0   No facility-administered medications prior to visit.     ROS See HPI  Objective:  BP (!) 150/100   Pulse 87   Temp 98.4 F (36.9 C) (Oral)   Ht 5\' 7"  (1.702 m)   Wt  (!) 314 lb (142.4 kg)   SpO2 97%   BMI 49.18 kg/m   BP Readings from Last 3 Encounters:  06/07/17 (!) 150/100  05/15/17 132/88  05/08/17 128/90    Wt Readings from Last 3 Encounters:  06/07/17 (!) 314 lb (142.4 kg)  05/15/17 (!) 312 lb (141.5 kg)  05/08/17 (!) 306 lb 12.8 oz (139.2 kg)    Physical Exam  Constitutional: She is oriented to person, place, and time. No distress.  Neck: Neck supple.  Cardiovascular: Normal rate, regular rhythm, normal heart sounds and intact distal pulses.  Pulmonary/Chest: Effort normal and breath sounds normal.  Musculoskeletal: She exhibits edema.  Neurological: She is alert and oriented to person, place, and time.  Skin: No erythema.  Psychiatric: She has a normal mood and affect. Her behavior is normal.  Vitals reviewed.  Lab Results  Component Value Date   GLUCOSE 112 (H) 05/15/2017   ALT 17 05/15/2017   AST 17 05/15/2017   NA 141 05/15/2017   K 4.1 05/15/2017   CL 104 05/15/2017   CREATININE 0.77 05/15/2017   BUN 12 05/15/2017   CO2 25 05/15/2017   TSH 3.220 05/15/2017    No results found.  Assessment & Plan:   Cheryl Ayala was seen today for follow-up.  Diagnoses and all orders for this visit:  Bilateral leg edema -     potassium chloride (K-DUR,KLOR-CON) 10 MEQ tablet; Take 1 tablet (10 mEq total)  by mouth daily. -     furosemide (LASIX) 20 MG tablet; Take 1 tablet (20 mg total) by mouth daily.  Attention deficit disorder (ADD) without hyperactivity -     dextroamphetamine (DEXEDRINE SPANSULE) 15 MG 24 hr capsule; Take 1 capsule (15 mg total) by mouth daily. -     Pain Mgmt, Profile 8 w/Conf, U  Excessive daytime sleepiness -     dextroamphetamine (DEXEDRINE SPANSULE) 15 MG 24 hr capsule; Take 1 capsule (15 mg total) by mouth daily.  Benign essential hypertension -     labetalol (NORMODYNE) 200 MG tablet; Take 1 tablet (200 mg total) by mouth 2 (two) times daily.  Family history of colonic polyps -     Ambulatory  referral to Gastroenterology  Weight gain with edema -     TSH; Future -     T3, free; Future -     T4, free; Future  Controlled substance agreement signed -     Pain Mgmt, Profile 8 w/Conf, U   I have changed Cheryl Ayala's potassium chloride. I am also having her maintain her diclofenac, sertraline, dextroamphetamine, furosemide, and labetalol.  Meds ordered this encounter  Medications  . dextroamphetamine (DEXEDRINE SPANSULE) 15 MG 24 hr capsule    Sig: Take 1 capsule (15 mg total) by mouth daily.    Dispense:  30 capsule    Refill:  0    Do not fill before 06/24/2017    Order Specific Question:   Supervising Provider    Answer:   Lucille Passy [[id number]]  . potassium chloride (K-DUR,KLOR-CON) 10 MEQ tablet    Sig: Take 1 tablet (10 mEq total) by mouth daily.    Dispense:  30 tablet    Refill:  2    Order Specific Question:   Supervising Provider    Answer:   Lucille Passy [[id number]]  . furosemide (LASIX) 20 MG tablet    Sig: Take 1 tablet (20 mg total) by mouth daily.    Dispense:  30 tablet    Refill:  2    Order Specific Question:   Supervising Provider    Answer:   Lucille Passy [[id number]]  . labetalol (NORMODYNE) 200 MG tablet    Sig: Take 1 tablet (200 mg total) by mouth 2 (two) times daily.    Dispense:  60 tablet    Refill:  2    Order Specific Question:   Supervising Provider    Answer:   Lucille Passy [[id number]]    Follow-up: Return in about 2 months (around 08/07/2017) for HTN and edema.  Wilfred Lacy, NP

## 2017-06-07 NOTE — Patient Instructions (Addendum)
Continue furosemide and potassium as prescribed Continue use of compression stocking. Maintain appt with vascular clinic as scheduled.  Urine pregnancy is negative.  Check BP once a day. Call office if BP is persistent >150/80 for more than 3days. You will need additional BP medication (lisinopril vs spironolactone/HCTZ?) I will like to avoid amlodipine at this time due to possible peripheral edema side effect.  Maintain DASH diet  Return to lab in 61month for repeat thyroid function test.   DASH Eating Plan DASH stands for "Dietary Approaches to Stop Hypertension." The DASH eating plan is a healthy eating plan that has been shown to reduce high blood pressure (hypertension). It may also reduce your risk for type 2 diabetes, heart disease, and stroke. The DASH eating plan may also help with weight loss. What are tips for following this plan? General guidelines  Avoid eating more than 2,300 mg (milligrams) of salt (sodium) a day. If you have hypertension, you may need to reduce your sodium intake to 1,500 mg a day.  Limit alcohol intake to no more than 1 drink a day for nonpregnant women and 2 drinks a day for men. One drink equals 12 oz of beer, 5 oz of wine, or 1 oz of hard liquor.  Work with your health care provider to maintain a healthy body weight or to lose weight. Ask what an ideal weight is for you.  Get at least 30 minutes of exercise that causes your heart to beat faster (aerobic exercise) most days of the week. Activities may include walking, swimming, or biking.  Work with your health care provider or diet and nutrition specialist (dietitian) to adjust your eating plan to your individual calorie needs. Reading food labels  Check food labels for the amount of sodium per serving. Choose foods with less than 5 percent of the Daily Value of sodium. Generally, foods with less than 300 mg of sodium per serving fit into this eating plan.  To find whole grains, look for the word  "whole" as the first word in the ingredient list. Shopping  Buy products labeled as "low-sodium" or "no salt added."  Buy fresh foods. Avoid canned foods and premade or frozen meals. Cooking  Avoid adding salt when cooking. Use salt-free seasonings or herbs instead of table salt or sea salt. Check with your health care provider or pharmacist before using salt substitutes.  Do not fry foods. Cook foods using healthy methods such as baking, boiling, grilling, and broiling instead.  Cook with heart-healthy oils, such as olive, canola, soybean, or sunflower oil. Meal planning   Eat a balanced diet that includes: ? 5 or more servings of fruits and vegetables each day. At each meal, try to fill half of your plate with fruits and vegetables. ? Up to 6-8 servings of whole grains each day. ? Less than 6 oz of lean meat, poultry, or fish each day. A 3-oz serving of meat is about the same size as a deck of cards. One egg equals 1 oz. ? 2 servings of low-fat dairy each day. ? A serving of nuts, seeds, or beans 5 times each week. ? Heart-healthy fats. Healthy fats called Omega-3 fatty acids are found in foods such as flaxseeds and coldwater fish, like sardines, salmon, and mackerel.  Limit how much you eat of the following: ? Canned or prepackaged foods. ? Food that is high in trans fat, such as fried foods. ? Food that is high in saturated fat, such as fatty meat. ? Sweets,  desserts, sugary drinks, and other foods with added sugar. ? Full-fat dairy products.  Do not salt foods before eating.  Try to eat at least 2 vegetarian meals each week.  Eat more home-cooked food and less restaurant, buffet, and fast food.  When eating at a restaurant, ask that your food be prepared with less salt or no salt, if possible. What foods are recommended? The items listed may not be a complete list. Talk with your dietitian about what dietary choices are best for you. Grains Whole-grain or whole-wheat  bread. Whole-grain or whole-wheat pasta. Brown rice. [place]. Bulgur. Whole-grain and low-sodium cereals. Pita bread. Low-fat, low-sodium crackers. Whole-wheat flour tortillas. Vegetables Fresh or frozen vegetables (raw, steamed, roasted, or grilled). Low-sodium or reduced-sodium tomato and vegetable juice. Low-sodium or reduced-sodium tomato sauce and tomato paste. Low-sodium or reduced-sodium canned vegetables. Fruits All fresh, dried, or frozen fruit. Canned fruit in natural juice (without added sugar). Meat and other protein foods Skinless chicken or [place]. Ground chicken or [place]. Pork with fat trimmed off. Fish and seafood. Egg whites. Dried beans, peas, or lentils. Unsalted nuts, nut butters, and seeds. Unsalted canned beans. Lean cuts of beef with fat trimmed off. Low-sodium, lean deli meat. Dairy Low-fat (1%) or fat-free (skim) milk. Fat-free, low-fat, or reduced-fat cheeses. Nonfat, low-sodium ricotta or cottage cheese. Low-fat or nonfat yogurt. Low-fat, low-sodium cheese. Fats and oils Soft margarine without trans fats. Vegetable oil. Low-fat, reduced-fat, or light mayonnaise and salad dressings (reduced-sodium). Canola, safflower, olive, soybean, and sunflower oils. Avocado. Seasoning and other foods Herbs. Spices. Seasoning mixes without salt. Unsalted popcorn and pretzels. Fat-free sweets. What foods are not recommended? The items listed may not be a complete list. Talk with your dietitian about what dietary choices are best for you. Grains Baked goods made with fat, such as croissants, muffins, or some breads. Dry pasta or rice meal packs. Vegetables Creamed or fried vegetables. Vegetables in a cheese sauce. Regular canned vegetables (not low-sodium or reduced-sodium). Regular canned tomato sauce and paste (not low-sodium or reduced-sodium). Regular tomato and vegetable juice (not low-sodium or reduced-sodium). Angie Fava. Olives. Fruits Canned fruit in a light or heavy  syrup. Fried fruit. Fruit in cream or butter sauce. Meat and other protein foods Fatty cuts of meat. Ribs. Fried meat. Berniece Salines. Sausage. Bologna and other processed lunch meats. Salami. Fatback. Hotdogs. Bratwurst. Salted nuts and seeds. Canned beans with added salt. Canned or smoked fish. Whole eggs or egg yolks. Chicken or [place] with skin. Dairy Whole or 2% milk, cream, and half-and-half. Whole or full-fat cream cheese. Whole-fat or sweetened yogurt. Full-fat cheese. Nondairy creamers. Whipped toppings. Processed cheese and cheese spreads. Fats and oils Butter. Stick margarine. Lard. Shortening. Ghee. Bacon fat. Tropical oils, such as coconut, palm kernel, or palm oil. Seasoning and other foods Salted popcorn and pretzels. Onion salt, garlic salt, seasoned salt, table salt, and sea salt. Worcestershire sauce. Tartar sauce. Barbecue sauce. Teriyaki sauce. Soy sauce, including reduced-sodium. Steak sauce. Canned and packaged gravies. Fish sauce. Oyster sauce. Cocktail sauce. Horseradish that you find on the shelf. Ketchup. Mustard. Meat flavorings and tenderizers. Bouillon cubes. Hot sauce and Tabasco sauce. Premade or packaged marinades. Premade or packaged taco seasonings. Relishes. Regular salad dressings. Where to find more information:  National Heart, Lung, and [place]: https://wilson-eaton.com/  American Heart Association: www.heart.org Summary  The DASH eating plan is a healthy eating plan that has been shown to reduce high blood pressure (hypertension). It may also reduce your risk for type 2 diabetes, heart  disease, and stroke.  With the DASH eating plan, you should limit salt (sodium) intake to 2,300 mg a day. If you have hypertension, you may need to reduce your sodium intake to 1,500 mg a day.  When on the DASH eating plan, aim to eat more fresh fruits and vegetables, whole grains, lean proteins, low-fat dairy, and heart-healthy fats.  Work with your health care provider or diet and  nutrition specialist (dietitian) to adjust your eating plan to your individual calorie needs. This information is not intended to replace advice given to you by your health care provider. Make sure you discuss any questions you have with your health care provider. Document Released: 12/21/2010 Document Revised: 12/26/2015 Document Reviewed: 12/26/2015 Elsevier Interactive Patient Education  [employer].

## 2017-06-08 ENCOUNTER — Encounter: Payer: Self-pay | Admitting: Nurse Practitioner

## 2017-06-10 LAB — PAIN MGMT, PROFILE 8 W/CONF, U
6 Acetylmorphine: NEGATIVE ng/mL
Alcohol Metabolites: NEGATIVE ng/mL
Amphetamine: 1056 ng/mL — ABNORMAL HIGH
Amphetamines: POSITIVE ng/mL — AB
Benzodiazepines: NEGATIVE ng/mL
Buprenorphine, Urine: NEGATIVE ng/mL
Cocaine Metabolite: NEGATIVE ng/mL
Creatinine: 40.7 mg/dL
MDMA: NEGATIVE ng/mL
Marijuana Metabolite: NEGATIVE ng/mL
Methamphetamine: NEGATIVE ng/mL
Opiates: NEGATIVE ng/mL
Oxidant: NEGATIVE ug/mL
Oxycodone: NEGATIVE ng/mL
pH: 6.85 (ref 4.5–9.0)

## 2017-06-11 ENCOUNTER — Encounter: Payer: Self-pay | Admitting: Gastroenterology

## 2017-06-18 ENCOUNTER — Other Ambulatory Visit: Payer: Self-pay

## 2017-06-18 DIAGNOSIS — R600 Localized edema: Secondary | ICD-10-CM

## 2017-07-03 ENCOUNTER — Encounter: Payer: Self-pay | Admitting: Nurse Practitioner

## 2017-07-03 ENCOUNTER — Encounter: Payer: 59 | Admitting: Vascular Surgery

## 2017-07-03 ENCOUNTER — Encounter (HOSPITAL_COMMUNITY): Payer: 59

## 2017-07-03 ENCOUNTER — Encounter: Payer: Self-pay | Admitting: Vascular Surgery

## 2017-07-03 ENCOUNTER — Ambulatory Visit (HOSPITAL_COMMUNITY)
Admission: RE | Admit: 2017-07-03 | Discharge: 2017-07-03 | Disposition: A | Discharge status: Home / Self Care | Payer: 59 | Source: Ambulatory Visit | Attending: Vascular Surgery | Admitting: Vascular Surgery

## 2017-07-03 ENCOUNTER — Other Ambulatory Visit: Payer: Self-pay

## 2017-07-03 ENCOUNTER — Ambulatory Visit (INDEPENDENT_AMBULATORY_CARE_PROVIDER_SITE_OTHER): Payer: 59 | Admitting: Vascular Surgery

## 2017-07-03 VITALS — BP 148/108 | HR 81 | Temp 98.2°F | Resp 16 | Ht 67.0 in | Wt 313.2 lb

## 2017-07-03 DIAGNOSIS — R600 Localized edema: Secondary | ICD-10-CM

## 2017-07-03 DIAGNOSIS — I872 Venous insufficiency (chronic) (peripheral): Secondary | ICD-10-CM

## 2017-07-03 DIAGNOSIS — I83893 Varicose veins of bilateral lower extremities with other complications: Secondary | ICD-10-CM | POA: Diagnosis not present

## 2017-07-03 MED FILL — D-AMPHETAMINE ER 15 MG CAP: 15 | 30 days supply | Qty: 30 | Fill #0

## 2017-07-03 NOTE — Progress Notes (Signed)
Requested by:  Flossie Buffy, [place], [place] [id number]  Reason for consultation: bilateral leg swelling    History of Present Illness   Cheryl Ayala is a 32 y.o. ([birth year]) female who presents with chief complaint: bilateral leg swelling.  Patient notes, onset of swelling 2 months ago, associated with no obvious trigger.  The patient's symptoms include: swelling in calves with extended standing and fatigue in both calves at end of day.  The patient has had no history of DVT, no history of pregnancy, known history of varicose vein, no history of venous stasis ulcers, no history of  Lymphedema and no history of skin changes in lower legs.  There is no family history of venous disorders.  The patient has used knee high compression stockings in the past.  Past Medical History:  Diagnosis Date  . Anxiety   . Hypertension     Past Surgical History:  Procedure Laterality Date  . WISDOM TOOTH EXTRACTION  2013    Social History   Socioeconomic History  . Marital status: Married    Spouse name: Not on file  . Number of children: Not on file  . Years of education: Not on file  . Highest education level: Not on file  Occupational History  . Not on file  Social Needs  . Financial resource strain: Not on file  . Food insecurity:    Worry: Not on file    Inability: Not on file  . Transportation needs:    Medical: Not on file    Non-medical: Not on file  Tobacco Use  . Smoking status: Never Smoker  . Smokeless tobacco: Never Used  Substance and Sexual Activity  . Alcohol use: Yes    Frequency: Never    Comment: once every 6 months   . Drug use: Never  . Sexual activity: Not on file  Lifestyle  . Physical activity:    Days per week: Not on file    Minutes per session: Not on file  . Stress: Not on file  Relationships  . Social connections:    Talks on phone: Not on file    Gets together: Not on file    Attends religious service: Not on  file    Active member of club or organization: Not on file    Attends meetings of clubs or organizations: Not on file    Relationship status: Not on file  . Intimate partner violence:    Fear of current or ex partner: Not on file    Emotionally abused: Not on file    Physically abused: Not on file    Forced sexual activity: Not on file  Other Topics Concern  . Not on file  Social History Narrative  . Not on file    Family History  Problem Relation Age of Onset  . Cancer Mother   . Hypertension Father   . Hyperlipidemia Father   . Anxiety disorder Father   . Depression Father   . Heart disease Father   . AAA (abdominal aortic aneurysm) Father   . Depression Sister   . Anxiety disorder Sister   . Heart disease Maternal Grandmother   . Alzheimer's disease Maternal Grandmother   . Stroke Maternal Grandmother   . Gout Maternal Grandfather   . Heart disease Paternal Grandfather     Current Outpatient Medications  Medication Sig Dispense Refill  . dextroamphetamine (DEXEDRINE SPANSULE) 15 MG 24 hr capsule Take 1 capsule (  15 mg total) by mouth daily. 30 capsule 0  . furosemide (LASIX) 20 MG tablet Take 1 tablet (20 mg total) by mouth daily. 30 tablet 2  . labetalol (NORMODYNE) 200 MG tablet Take 1 tablet (200 mg total) by mouth 2 (two) times daily. 60 tablet 2  . sertraline (ZOLOFT) 100 MG tablet 1 tablet daily    . diclofenac (VOLTAREN) 75 MG EC tablet Take 1 tablet (75 mg total) by mouth 2 (two) times daily. (Patient not taking: Reported on 07/03/2017) 50 tablet 2  . potassium chloride (K-DUR,KLOR-CON) 10 MEQ tablet Take 1 tablet (10 mEq total) by mouth daily. (Patient not taking: Reported on 07/03/2017) 30 tablet 2   No current facility-administered medications for this visit.     Allergies  Allergen Reactions  . Penicillins Rash  . Sulfa Antibiotics Rash    REVIEW OF SYSTEMS (negative unless checked):   Cardiac:  []  Chest pain or chest pressure? []  Shortness of breath  upon activity? []  Shortness of breath when lying flat? []  Irregular heart rhythm?  Vascular:  []  Pain in calf, thigh, or hip brought on by walking? []  Pain in feet at night that wakes you up from your sleep? []  Blood clot in your veins? [x]  Leg swelling?  Pulmonary:  []  Oxygen at home? []  Productive cough? []  Wheezing?  Neurologic:  []  Sudden weakness in arms or legs? []  Sudden numbness in arms or legs? []  Sudden onset of difficult speaking or slurred speech? []  Temporary loss of vision in one eye? []  Problems with dizziness?  Gastrointestinal:  []  Blood in stool? []  Vomited blood?  Genitourinary:  []  Burning when urinating? []  Blood in urine?  Psychiatric:  []  Major depression  Hematologic:  []  Bleeding problems? []  Problems with blood clotting?  Dermatologic:  []  Rashes or ulcers?  Constitutional:  []  Fever or chills?  Ear/Nose/Throat:  []  Change in hearing? []  Nose bleeds? []  Sore throat?  Musculoskeletal:  []  Back pain? []  Joint pain? []  Muscle pain?   Physical Examination     Vitals:   07/03/17 1341 07/03/17 1344  BP: (!) 159/103 (!) 148/108  Pulse: 81   Resp: 16   Temp: 98.2 F (36.8 C)   TempSrc: Oral   SpO2: 97%   Weight: (!) 313 lb 3.2 oz (142.1 kg)   Height: 5\' 7"  (1.702 m)    Body mass index is 49.05 kg/m.  General Alert, O x 3, obese, NAD  Head [place]/AT,    Ear/Nose/ Throat Hearing grossly intact, nares without erythema or drainage, oropharynx without Erythema or Exudate, Mallampati score: 3,   Eyes PERRLA, EOMI,    Neck Supple, mid-line trachea,    Pulmonary Sym exp, good B air movt, CTA B  Cardiac RRR, Nl S1, S2, no Murmurs, No rubs, No S3,S4  Vascular Vessel Right Left  Radial Palpable Palpable  Brachial Palpable Palpable  Carotid Palpable, No Bruit Palpable, No Bruit  Aorta Not palpable N/A  Femoral Palpable Palpable  Popliteal Not palpable Not palpable  PT Faintly palpable Faintly palpable  DP Faintly palpable  Faintly palpable    Gastro- intestinal soft, non-distended, non-tender to palpation, No guarding or rebound, no HSM, no masses, no CVAT B, No palpable prominent aortic pulse,    Musculo- skeletal M/S 5/5 throughout  , Extremities without ischemic changes  , Non-pitting edema present: 2+ B, Varicosities present: B, No Lipodermatosclerosis present  Neurologic Cranial nerves 2-12 intact , Pain and light touch intact in extremities , Motor exam as  listed above  Psychiatric Judgement intact, Mood & affect appropriate for pt's clinical situation  Dermatologic See M/S exam for extremity exam, No rashes otherwise noted  Lymphatic  Palpable lymph nodes: None    Non-invasive Vascular Imaging   BLE Venous Insufficiency Duplex (07/03/2017):   RLE:   no DVT and SVT,   + GSV reflux,: 4.5-6.2 mm  no SSV reflux,  no deep venous reflux  LLE:  no DVT and SVT,   + GSV reflux: 3.6-5.5 mm  no SSV reflux,  no deep venous reflux   Medical Decision Making   Cheryl Ayala is a 32 y.o. female who presents with: BLE chronic venous insufficiency (C3 Ep As Pr), B varicose veins with complications   Based on the patient's history and examination, I recommend: compressive therapy.  I discussed with the patient the use of her 20-30 mm thigh high compression stockings and need for 3 month trial of such.  The patient will follow up in 3 months with my partners in the [facility] for evaluation for: re-evaluation of EVLA.  Thank you for allowing [place] to participate in this patient's care.   Adele Barthel, MD, FACS [facility] Office: [phone] Pager: [phone]  07/03/2017, 1:44 PM

## 2017-07-10 ENCOUNTER — Other Ambulatory Visit: Payer: Self-pay

## 2017-07-10 ENCOUNTER — Ambulatory Visit (INDEPENDENT_AMBULATORY_CARE_PROVIDER_SITE_OTHER): Payer: 59 | Admitting: Family Medicine

## 2017-07-10 ENCOUNTER — Encounter: Payer: Self-pay | Admitting: Family Medicine

## 2017-07-10 VITALS — BP 154/104 | HR 76 | Temp 99.2°F | Ht 67.0 in | Wt 312.0 lb

## 2017-07-10 DIAGNOSIS — I10 Essential (primary) hypertension: Secondary | ICD-10-CM

## 2017-07-10 DIAGNOSIS — R635 Abnormal weight gain: Secondary | ICD-10-CM

## 2017-07-10 DIAGNOSIS — R609 Edema, unspecified: Secondary | ICD-10-CM

## 2017-07-10 MED ORDER — AMLODIPINE BESYLATE 5 MG PO TABS: 5.0000 mg | ORAL_TABLET | Freq: Every day | ORAL | 3 refills | Status: DC

## 2017-07-10 MED FILL — AMLODIPINE BESYLATE 5 MG TA: 5 | 90 days supply | Qty: 90 | Fill #0

## 2017-07-10 NOTE — Progress Notes (Signed)
Subjective:   Patient ID: Cheryl Ayala, female    DOB: 09-26-1985, 32 y.o.   MRN: [id number]  Cheryl Ayala is a pleasant 32 y.o. year old female who presents to clinic today with Hypertension (Patient is here today to discuss BP medication.  She originally called requesting that Lobetalol be increased.  She is currently at the dosage that Dr. Deborra Medina feels comfortable with so she is here for either an addition or change.  BP has been running 150/100.  She does not want Lisinopril.  )  on 07/10/2017  HPI:   Pt is new to me.  Sent message to PCP but [place] so I am covering her patients-    Block Pressure is still elevated. Can I increase labetalol to 300 mg BID? I do not want to take lisinopril. I do not think I need the fluid peel anymore due to the diagnoses I received today from the vein doctor.   I responded with-  The maximum dose of labetolol that I feel comfortable prescribing is 200 mg twice daily. If she would like to come in to discuss adding other medications, please have her schedule an OV with me or another provider.  Thanks!    Does not want to take an ACEI because her dad had a chronic cough for years with it.+    Current Outpatient Medications on File Prior to Visit  Medication Sig Dispense Refill  . dextroamphetamine (DEXEDRINE SPANSULE) 15 MG 24 hr capsule Take 1 capsule (15 mg total) by mouth daily. 30 capsule 0  . labetalol (NORMODYNE) 200 MG tablet Take 1 tablet (200 mg total) by mouth 2 (two) times daily. 60 tablet 2  . sertraline (ZOLOFT) 100 MG tablet 1 tablet daily    . diclofenac (VOLTAREN) 75 MG EC tablet Take 1 tablet (75 mg total) by mouth 2 (two) times daily. (Patient not taking: Reported on 07/03/2017) 50 tablet 2   No current facility-administered medications on file prior to visit.     Allergies  Allergen Reactions  . Penicillins Rash  . Sulfa Antibiotics Rash    Past Medical History:  Diagnosis Date  . Anxiety   .  Hypertension     Past Surgical History:  Procedure Laterality Date  . WISDOM TOOTH EXTRACTION  2013    Family History  Problem Relation Age of Onset  . Cancer Mother   . Hypertension Father   . Hyperlipidemia Father   . Anxiety disorder Father   . Depression Father   . Heart disease Father   . AAA (abdominal aortic aneurysm) Father   . Depression Sister   . Anxiety disorder Sister   . Heart disease Maternal Grandmother   . Alzheimer's disease Maternal Grandmother   . Stroke Maternal Grandmother   . Gout Maternal Grandfather   . Heart disease Paternal Grandfather     Social History   Socioeconomic History  . Marital status: Married    Spouse name: Not on file  . Number of children: Not on file  . Years of education: Not on file  . Highest education level: Not on file  Occupational History  . Not on file  Social Needs  . Financial resource strain: Not on file  . Food insecurity:    Worry: Not on file    Inability: Not on file  . Transportation needs:    Medical: Not on file    Non-medical: Not on file  Tobacco Use  .  Smoking status: Never Smoker  . Smokeless tobacco: Never Used  Substance and Sexual Activity  . Alcohol use: Yes    Frequency: Never    Comment: once every 6 months   . Drug use: Never  . Sexual activity: Not on file  Lifestyle  . Physical activity:    Days per week: Not on file    Minutes per session: Not on file  . Stress: Not on file  Relationships  . Social connections:    Talks on phone: Not on file    Gets together: Not on file    Attends religious service: Not on file    Active member of club or organization: Not on file    Attends meetings of clubs or organizations: Not on file    Relationship status: Not on file  . Intimate partner violence:    Fear of current or ex partner: Not on file    Emotionally abused: Not on file    Physically abused: Not on file    Forced sexual activity: Not on file  Other Topics Concern  . Not on  file  Social History Narrative  . Not on file   The PMH, PSH, Social History, Family History, Medications, and allergies have been reviewed in [facility], and have been updated if relevant.       Review of Systems  Constitutional: Negative.   Respiratory: Negative.   Cardiovascular: Negative.   Neurological: Negative.        Objective:    BP (!) 154/104 (BP Location: Left Arm, Cuff Size: Large)   Pulse 76   Temp 99.2 F (37.3 C) (Oral)   Ht 5\' 7"  (1.702 m)   Wt (!) 312 lb (141.5 kg)   LMP 07/04/2017   SpO2 97%   BMI 48.87 kg/m    Physical Exam  Constitutional: She appears well-developed and well-nourished. No distress.  HENT:  Head: Normocephalic and atraumatic.  Eyes: EOM are normal.  Cardiovascular: Normal rate.  Pulmonary/Chest: Effort normal.  Musculoskeletal: She exhibits edema.  +edema/venous insuffiencey wearing compression hose.  Skin: Skin is warm and dry. She is not diaphoretic.  Psychiatric: She has a normal mood and affect. Her behavior is normal. Judgment and thought content normal.  Nursing note and vitals reviewed.         Assessment & Plan:   Benign essential hypertension  Weight gain with edema - Plan: T4, free, TSH, T3, free No follow-ups on file.

## 2017-07-10 NOTE — Addendum Note (Signed)
Addended by: Diona Foley on: 07/10/2017 01:04 PM   Modules accepted: Orders

## 2017-07-10 NOTE — Patient Instructions (Addendum)
Great to see you.  We are adding amlodipine 5 mg daily.  Please keep me updated with your blood pressure.

## 2017-07-11 ENCOUNTER — Encounter (HOSPITAL_COMMUNITY): Payer: Self-pay

## 2017-07-11 ENCOUNTER — Emergency Department (HOSPITAL_COMMUNITY): Payer: 59

## 2017-07-11 ENCOUNTER — Other Ambulatory Visit: Payer: Self-pay

## 2017-07-11 ENCOUNTER — Emergency Department (HOSPITAL_COMMUNITY)
Admission: EM | Admit: 2017-07-11 | Discharge: 2017-07-11 | Disposition: A | Discharge status: Home / Self Care | Payer: 59 | Attending: Emergency Medicine | Admitting: Emergency Medicine

## 2017-07-11 DIAGNOSIS — R0789 Other chest pain: Secondary | ICD-10-CM | POA: Diagnosis not present

## 2017-07-11 DIAGNOSIS — K8020 Calculus of gallbladder without cholecystitis without obstruction: Secondary | ICD-10-CM | POA: Diagnosis not present

## 2017-07-11 DIAGNOSIS — R935 Abnormal findings on diagnostic imaging of other abdominal regions, including retroperitoneum: Secondary | ICD-10-CM | POA: Diagnosis not present

## 2017-07-11 DIAGNOSIS — R079 Chest pain, unspecified: Secondary | ICD-10-CM | POA: Diagnosis not present

## 2017-07-11 DIAGNOSIS — I10 Essential (primary) hypertension: Secondary | ICD-10-CM | POA: Diagnosis not present

## 2017-07-11 DIAGNOSIS — Z79899 Other long term (current) drug therapy: Secondary | ICD-10-CM | POA: Diagnosis not present

## 2017-07-11 LAB — LIPASE, BLOOD: LIPASE: 33 U/L (ref 11–51)

## 2017-07-11 LAB — HEPATIC FUNCTION PANEL
ALBUMIN: 3.7 g/dL (ref 3.5–5.0)
ALT: 19 U/L (ref 0–44)
AST: 23 U/L (ref 15–41)
Alkaline Phosphatase: 41 U/L (ref 38–126)
Bilirubin, Direct: <0.1 mg/dL (ref 0.0–0.2)
TOTAL PROTEIN: 6.6 g/dL (ref 6.5–8.1)
Total Bilirubin: 0.3 mg/dL (ref 0.3–1.2)

## 2017-07-11 LAB — BASIC METABOLIC PANEL
Anion gap: 10 (ref 5–15)
BUN: 11 mg/dL (ref 6–20)
CHLORIDE: 105 mmol/L (ref 98–111)
CO2: 25 mmol/L (ref 22–32)
CREATININE: 0.83 mg/dL (ref 0.44–1.00)
Calcium: 8.8 mg/dL — ABNORMAL LOW (ref 8.9–10.3)
GFR calc Af Amer: >60 mL/min (ref >60)
GFR calc non Af Amer: >60 mL/min (ref >60)
GLUCOSE: 102 mg/dL — AB (ref 70–99)
Potassium: 4 mmol/L (ref 3.5–5.1)
Sodium: 140 mmol/L (ref 135–145)

## 2017-07-11 LAB — CBC
HCT: 38.6 % (ref 36.0–46.0)
Hemoglobin: 12.3 g/dL (ref 12.0–15.0)
MCH: 26.9 pg (ref 26.0–34.0)
MCHC: 31.9 g/dL (ref 30.0–36.0)
MCV: 84.3 fL (ref 78.0–100.0)
Platelets: 393 10*3/uL (ref 150–400)
RBC: 4.58 MIL/uL (ref 3.87–5.11)
RDW: 12.8 % (ref 11.5–15.5)
WBC: 9.7 10*3/uL (ref 4.0–10.5)

## 2017-07-11 LAB — I-STAT TROPONIN, ED
Troponin i, poc: 0.01 ng/mL (ref 0.00–0.08)
Troponin i, poc: 0 ng/mL (ref 0.00–0.08)

## 2017-07-11 LAB — I-STAT BETA HCG BLOOD, ED (MC, WL, AP ONLY): I-stat hCG, quantitative: <5 m[IU]/mL (ref <5)

## 2017-07-11 LAB — T4, FREE: FREE T4: 1 ng/dL (ref 0.82–1.77)

## 2017-07-11 LAB — T3, FREE: T3, Free: 3.2 pg/mL (ref 2.0–4.4)

## 2017-07-11 LAB — TSH: TSH: 2.78 u[IU]/mL (ref 0.450–4.500)

## 2017-07-11 MED ORDER — MORPHINE SULFATE (PF) 4 MG/ML IV SOLN
4.0000 mg | Freq: Once | INTRAVENOUS | Status: AC
  Administered 2017-07-11: 4 mg via INTRAVENOUS
  Filled 2017-07-11: qty 1

## 2017-07-11 MED ORDER — IOPAMIDOL (ISOVUE-370) INJECTION 76%
INTRAVENOUS | Status: AC
  Administered 2017-07-11: 100 mL
  Filled 2017-07-11: qty 100

## 2017-07-11 MED ORDER — NAPROXEN SODIUM 550 MG PO TABS: 550.0000 mg | ORAL_TABLET | Freq: Two times a day (BID) | ORAL | 0 refills | Status: DC

## 2017-07-11 MED ORDER — MORPHINE SULFATE (PF) 4 MG/ML IV SOLN
4.0000 mg | Freq: Once | INTRAVENOUS | Status: DC
  Filled 2017-07-11: qty 1

## 2017-07-11 MED ORDER — ONDANSETRON HCL 4 MG/2ML IJ SOLN
4.0000 mg | Freq: Once | INTRAMUSCULAR | Status: AC
  Administered 2017-07-11: 4 mg via INTRAVENOUS
  Filled 2017-07-11: qty 2

## 2017-07-11 NOTE — ED Triage Notes (Signed)
Pt reports she woke up with chest pain around 0800 this morning. Describes as sharp. States it hurts to take a deep breath. Hx of HTN and venus insufficiency.

## 2017-07-11 NOTE — Discharge Instructions (Addendum)
Read instructions below for reasons to return to the Emergency Department. It is recommended that your follow up with your Primary Care Doctor in regards to today's visit. If you do not have a doctor, use the resource guide listed below to help you find one.  Begin taking over the counter Prilosec or Zegrid (omeprazole) as directed.   Tests performed today include: An EKG of your heart A chest x-ray CT scan of your chest and abdomen. Ultrasound of your right upper quadrant that showed cholelithiasis without evidence of acute cholecystitis Cardiac enzymes - a blood test for heart muscle damage and this was taken on 2 occasions without elevation Blood counts and electrolytes Vital signs. See below for your results today.   Chest Pain (Nonspecific)  HOME CARE INSTRUCTIONS  For the next few days, avoid physical activities that bring on chest pain. Continue physical activities as directed.  Do not smoke cigarettes or drink alcohol until your symptoms are gone. If you do smoke, it is time to quit. You may receive instructions and counseling on how to stop smoking. Only take over-the-counter or prescription medicine for pain, discomfort, or fever as directed by your caregiver.  Follow your caregiver's suggestions for further testing if your chest pain does not go away.  Keep any follow-up appointments you made. If you do not go to an appointment, you could develop lasting (chronic) problems with pain. If there is any problem keeping an appointment, you must call to reschedule.  SEEK MEDICAL CARE IF:  You think you are having problems from the medicine you are taking. Read your medicine instructions carefully.  Your chest pain does not go away, even after treatment.  You develop a rash with blisters on your chest.  SEEK IMMEDIATE MEDICAL CARE IF:  You have increased chest pain or pain that spreads to your arm, neck, jaw, back, or belly (abdomen).  You develop shortness of breath, an increasing cough,  or you are coughing up blood.  You have severe back or abdominal pain, feel sick to your stomach (nauseous) or throw up (vomit).  You develop severe weakness, fainting, or chills.  You have an oral temperature above 102 F (38.9 C), not controlled by medicine.  THIS IS AN EMERGENCY. Do not wait to see if the pain will go away. Get medical help at once. Call your local emergency services (911 in [place] Do not drive yourself to the hospital. Additional Information:  Your vital signs today were: BP 140/82    Pulse 80    Temp 98.3 F (36.8 C) (Oral)    Resp 20    LMP 07/04/2017    SpO2 96%  If your blood pressure (BP) was elevated above 135/85 this visit, please have this repeated by your doctor within one month. ---------------

## 2017-07-11 NOTE — Assessment & Plan Note (Signed)
>>  ASSESSMENT AND PLAN FOR BENIGN ESSENTIAL HYPERTENSION WRITTEN ON 07/11/2017 11:53 AM BY Lucille Passy, MD  Discussed options with pt.  She is willing to add norvasc again to her betblocker.  Stopped it in past due to edema which has been attributed to venous insuffiiency.  She will keep checking her BP and update me or her PCP.

## 2017-07-11 NOTE — Assessment & Plan Note (Signed)
Discussed options with pt.  She is willing to add norvasc again to her betblocker.  Stopped it in past due to edema which has been attributed to venous insuffiiency.  She will keep checking her BP and update me or her PCP.

## 2017-07-11 NOTE — ED Notes (Signed)
ED Provider at bedside. 

## 2017-07-11 NOTE — ED Notes (Signed)
Patient transported to CT 

## 2017-07-11 NOTE — ED Provider Notes (Signed)
[facility] Provider Note   CSN: [id number] Arrival date & time: 07/11/17  1129     History   Chief Complaint Chief Complaint  Patient presents with  . Chest Pain    HPI Cheryl Ayala is a 32 y.o. female with a history of venous insufficiency and hypertension who presents emergency department today for chest pain.  Patient reports that she woke at 8:00 this morning with left-sided chest pain that radiated to her back that she describes as sharp.  The patient reports that initially she did have some tingling that went down her left arm that is now resolved.  She denies any pain that radiates to her neck, jaw, either shoulder, or abdomen.  The patient notes that she is taking her blood pressure medication this morning for her symptoms without any relief.  She reports that the pain has waxed and waned since onset but has been constant.  She denies history of similar pain in the past.  She notes that the pain is worse with deep breaths.  It is not exertional or positional in nature.  She notes that she does have some shortness of breath when she takes a deep breath but otherwise denies any shortness of breath.  There is no associated nausea, diaphoresis or emesis with this.  Patient reports her current pain level is a 7/10.  She denies any pain medication at this current time.  Patient does admit to positive family history for early CAD.  She denies any personal history of MI, CVA, TIA.  She has never had a stress test or heart catheterization before. The patient reports that she has had a recent diagnosis of venous insufficiency.  She recently had an echocardiogram on 05/23/2017 that showed an EF of 60 to 65% with normal systolic function and no evidence of wall motion abnormalities.  She had bilateral lower extremity ultrasounds were negative for DVTs.  Patient is a never smoker.  She denies any alcohol or drug use.  No cocaine use specifically.  No recent  increase in activity or workout regimens because of pain.  The pain is not worsened by palpation.  No recent URI illnesses. Denies risk exogenous estrogen use, recent surgery or travel, trauma, immobilization, smoking, previous blood clot, cough, hemoptysis, personal history of cancer, unilateral lower extremity swelling or pain.   HPI  Past Medical History:  Diagnosis Date  . Anxiety   . Hypertension     Patient Active Problem List   Diagnosis Date Noted  . Chronic venous insufficiency 07/03/2017  . Varicose veins of bilateral lower extremities with other complications [id number]/3875  . Controlled substance agreement signed 06/07/2017  . Bilateral leg edema 06/07/2017  . Depression 12/23/2015  . Hypersomnia 12/23/2015  . ADD (attention deficit disorder) 10/06/2015  . Excessive daytime sleepiness 10/06/2015  . Adult ADHD 09/02/2015  . Anovulation 05/19/2015  . Benign essential hypertension 05/19/2015  . Generalized anxiety disorder 05/19/2015  . Obesity 05/19/2015    Past Surgical History:  Procedure Laterality Date  . WISDOM TOOTH EXTRACTION  2013     OB History   None      Home Medications    Prior to Admission medications   Medication Sig Start Date End Date Taking? Authorizing Provider  amLODipine (NORVASC) 5 MG tablet Take 1 tablet (5 mg total) by mouth daily. 07/10/17   Lucille Passy, MD  dextroamphetamine (DEXEDRINE SPANSULE) 15 MG 24 hr capsule Take 1 capsule (15 mg total) by mouth  daily. 06/24/17 04/06/18  Nche, Charlene Brooke, NP  diclofenac (VOLTAREN) 75 MG EC tablet Take 1 tablet (75 mg total) by mouth 2 (two) times daily. Patient not taking: Reported on 07/03/2017 12/12/16   Wallene Huh, DPM  labetalol (NORMODYNE) 200 MG tablet Take 1 tablet (200 mg total) by mouth 2 (two) times daily. 06/07/17   Nche, Charlene Brooke, NP  sertraline (ZOLOFT) 100 MG tablet 1 tablet daily 04/20/16   [provider]    Family History Family History  Problem Relation Age  of Onset  . Cancer Mother   . Hypertension Father   . Hyperlipidemia Father   . Anxiety disorder Father   . Depression Father   . Heart disease Father   . AAA (abdominal aortic aneurysm) Father   . Depression Sister   . Anxiety disorder Sister   . Heart disease Maternal Grandmother   . Alzheimer's disease Maternal Grandmother   . Stroke Maternal Grandmother   . Gout Maternal Grandfather   . Heart disease Paternal Grandfather     Social History Social History   Tobacco Use  . Smoking status: Never Smoker  . Smokeless tobacco: Never Used  Substance Use Topics  . Alcohol use: Yes    Frequency: Never    Comment: once every 6 months   . Drug use: Never     Allergies   Penicillins and Sulfa antibiotics   Review of Systems Review of Systems  All other systems reviewed and are negative.    Physical Exam Updated Vital Signs BP (!) 150/81   Pulse 83   Temp 98.3 F (36.8 C) (Oral)   Resp 19   LMP 07/04/2017   SpO2 97%   Physical Exam  Constitutional: She appears well-developed and well-nourished.  HENT:  Head: Normocephalic and atraumatic.  Right Ear: External ear normal.  Left Ear: External ear normal.  Nose: Nose normal.  Mouth/Throat: Uvula is midline, oropharynx is clear and moist and mucous membranes are normal. No tonsillar exudate.  Eyes: Pupils are equal, round, and reactive to light. Right eye exhibits no discharge. Left eye exhibits no discharge. No scleral icterus.  Neck: Trachea normal. Neck supple. No spinous process tenderness present. No neck rigidity. Normal range of motion present.  Cardiovascular: Normal rate, regular rhythm and intact distal pulses.  No murmur heard. Pulses:      Radial pulses are 2+ on the right side, and 2+ on the left side.       Dorsalis pedis pulses are 2+ on the right side, and 2+ on the left side.       Posterior tibial pulses are 2+ on the right side, and 2+ on the left side.  No lower extremity swelling or edema.  Calves symmetric in size bilaterally.  Pulmonary/Chest: Effort normal and breath sounds normal. She exhibits tenderness.  Abdominal: Soft. Bowel sounds are normal. She exhibits no distension. There is no tenderness. There is no rigidity, no rebound, no guarding and no CVA tenderness.  Musculoskeletal: She exhibits no edema.  Lymphadenopathy:    She has no cervical adenopathy.  Neurological: She is alert.  Speech clear. Follows commands. No facial droop. PERRLA. EOM grossly intact. CN III-XII grossly intact. Grossly moves all extremities 4 without ataxia. Able and appropriate strength for age to upper and lower extremities bilaterally. Normal sensation to upper and lower extremities.   Skin: Skin is warm and dry. No rash noted. She is not diaphoretic.  No vesicular like rash to the chest wall.  Psychiatric: She has a normal mood and affect.  Nursing note and vitals reviewed.    ED Treatments / Results  Labs (all labs ordered are listed, but only abnormal results are displayed) Labs Reviewed  BASIC METABOLIC PANEL - Abnormal; Notable for the following components:      Result Value   Glucose, Bld 102 (*)    Calcium 8.8 (*)    All other components within normal limits  CBC  HEPATIC FUNCTION PANEL  LIPASE, BLOOD  I-STAT TROPONIN, ED  I-STAT BETA HCG BLOOD, ED ([facility], [facility], [facility] ONLY)  I-STAT TROPONIN, ED    EKG EKG Interpretation  Date/Time:  Wednesday July 10 2017 11:40:09 EDT Ventricular Rate:  76 PR Interval:  146 QRS Duration: 94 QT Interval:  400 QTC Calculation: 450 R Axis:   31 Text Interpretation:  Normal sinus rhythm Normal ECG No old tracing to compare Confirmed by Daleen Bo [phone]) on 07/11/2017 2:23:46 PM   Radiology Dg Chest 2 View  Result Date: 07/11/2017 CLINICAL DATA:  Left side chest pain today, pain is sharp and constant - hx of htn, nonsmoker, no known heart or lung issues EXAM: CHEST - 2 VIEW COMPARISON:  none FINDINGS: Lungs are clear. Heart size and  mediastinal contours are within normal limits. No effusion.  No pneumothorax. Visualized bones unremarkable. IMPRESSION: No acute cardiopulmonary disease. Electronically Signed   By: Lucrezia Europe M.D.   On: 07/11/2017 12:37   Ct Angio Chest/abd/pel For Dissection W And/or W/wo  Result Date: 07/11/2017 CLINICAL DATA:  Mid chest pain beginning this morning radiating to the back. History of hypertension. Family history of aortic aneurysm. EXAM: CT ANGIOGRAPHY CHEST, ABDOMEN AND PELVIS TECHNIQUE: Multidetector CT imaging through the chest, abdomen and pelvis was performed using the standard protocol during bolus administration of intravenous contrast. Multiplanar reconstructed images and MIPs were obtained and reviewed to evaluate the vascular anatomy. CONTRAST:  170mL ISOVUE-370 IOPAMIDOL (ISOVUE-370) INJECTION 76% COMPARISON:  Current chest radiographs. FINDINGS: CTA CHEST FINDINGS Cardiovascular: The thoracic aorta is normal in caliber. No dissection or aneurysm. No atherosclerotic plaque. Heart is normal in size and configuration. No pericardial effusion. No coronary artery calcifications. Pulmonary arteries are normal in caliber. Mediastinum/Nodes: No enlarged mediastinal, hilar, or axillary lymph nodes. Thyroid gland, trachea, and esophagus demonstrate no significant findings. Lungs/Pleura: Minimal pleural effusions. Single small air cysts noted in each lower lobe. Minimal dependent subsegmental atelectasis. Lungs otherwise clear with no evidence of pneumonia or pulmonary edema. No pneumothorax. Review of the MIP images confirms the above findings. CTA ABDOMEN AND PELVIS FINDINGS VASCULAR Aorta: Normal caliber aorta without aneurysm, dissection, vasculitis or significant stenosis. Celiac: Patent without evidence of aneurysm, dissection, vasculitis or significant stenosis. SMA: Patent without evidence of aneurysm, dissection, vasculitis or significant stenosis. Renals: 2 renal arteries on the right. Single left  renal artery. Renal arteries are widely patent with no dissection, aneurysm or vasculitis. IMA: Patent without evidence of aneurysm, dissection, vasculitis or significant stenosis. Inflow: Patent without evidence of aneurysm, dissection, vasculitis or significant stenosis. Veins: No obvious venous abnormality within the limitations of this arterial phase study. Review of the MIP images confirms the above findings. NON-VASCULAR Hepatobiliary: Liver normal in size. Decreased attenuation of the liver is consistent with hepatic steatosis. No liver mass or focal lesion. 2.2 cm gallstone in the gallbladder neck. There is mild hazy infiltration of the pericholecystic fat. Despite the gallbladder only being mildly distended, the findings support early acute cholecystitis in the proper clinical setting. No bile duct dilation. Pancreas:  Unremarkable. No pancreatic ductal dilatation or surrounding inflammatory changes. Spleen: Normal in size without focal abnormality. Adrenals/Urinary Tract: No adrenal masses. Kidneys normal size, orientation and position. 13 mm low-density left upper pole renal mass consistent with a cyst. No other renal masses, no stones and no hydronephrosis. Normal ureters. Normal bladder. Stomach/Bowel: Stomach is within normal limits. Appendix appears normal. No evidence of bowel wall thickening, distention, or inflammatory changes. Lymphatic: No enlarged lymph nodes. Reproductive: Uterus and bilateral adnexa are unremarkable. Other: No abdominal wall hernia or abnormality. No abdominopelvic ascites. MUSCULOSKELETAL FINDINGS No fracture or acute finding. No osteoblastic or osteolytic lesions. No significant skeletal abnormality. Review of the MIP images confirms the above findings. IMPRESSION: 1. Normal thoracoabdominal aorta. Widely patent branch vessels with no atherosclerosis. 2. 2.2 cm gallstone. Mild pericholecystic inflammation. Suspect early acute cholecystitis as the cause of this patient's pain.  Consider follow-up right upper quadrant ultrasound for further assessment. 3. No other evidence of an acute abnormality. Electronically Signed   By: Lajean Manes M.D.   On: 07/11/2017 15:24   US Abdomen Limited Ruq  Result Date: 07/11/2017 CLINICAL DATA:  Cholelithiasis on CT chest EXAM: ULTRASOUND ABDOMEN LIMITED RIGHT UPPER QUADRANT COMPARISON:  CT from earlier the same day FINDINGS: Gallbladder: 2.2 cm shadowing gallstone in the gallbladder near the neck. There is no gallbladder wall thickening. Sonographer describes no sonographic Murphy's sign. No pericholecystic fluid. Common bile duct: Diameter: 2.7 mm Liver: No focal lesion identified. Within normal limits in parenchymal echogenicity. Portal vein is patent on color Doppler imaging with normal direction of blood flow towards the liver. IMPRESSION: 1. Cholelithiasis without other ultrasound evidence of cholecystitis or biliary obstruction. Electronically Signed   By: Lucrezia Europe M.D.   On: 07/11/2017 16:35    Procedures Procedures (including critical care time)  Medications Ordered in ED Medications  morphine 4 MG/ML injection 4 mg (4 mg Intravenous Refused 07/11/17 1837)  iopamidol (ISOVUE-370) 76 % injection (100 mLs  Contrast Given 07/11/17 1450)  morphine 4 MG/ML injection 4 mg (4 mg Intravenous Given 07/11/17 1556)  ondansetron (ZOFRAN) injection 4 mg (4 mg Intravenous Given 07/11/17 1556)     Initial Impression / Assessment and Plan / ED Course  I have reviewed the triage vital signs and the nursing notes.  Pertinent labs & imaging results that were available during my care of the patient were reviewed by me and considered in my medical decision making (see chart for details).     32 year old female presenting with family history of CAD, venous insufficiency and hypertension presenting with chest pain that began at 8:00 this morning that is located in the left side with radiation to her back she describes as sharp.  Patient notes  the pain is not exertional in nature.  Is not associate with nausea, diaphoresis or emesis. She recently had an echocardiogram on 05/23/2017 that showed an EF of 60 to 65% with normal systolic function and no evidence of wall motion abnormalities.  She had bilateral lower extremity ultrasounds were negative for DVTs.  Denies risk factors for PE.  Patient is noted to be significantly hypertensive on presentation.  Given chest pain with radiation to the back and hypertension will order CT dissection study to evaluate.  Patient's EKG with NSR.  No STEMI.  Troponin within normal limits.  Pregnancy test negative.  No leukocytosis.  No anemia.  Labs otherwise reassuring.  CT scan with no evidence of atherosclerosis. No PE. Patient without evidence of a dissection.  There is noted  to be 2.2 cm gallstone.  Patient without tenderness on abdominal exam however given that there is pericholecystic fluid will get right upper quadrant ultrasound, lipase, LFTs to evaluate.  Abdominal exam without any tenderness palpation.  Negative Murphy sign.  No other acute findings on CT scan.  Patient denies similar pain in the past.  Pain is not associated with food.  But her quadrant ultrasound with cholelithiasis without evidence of cholecystitis.  There is a 2.2 cm gallstone in the gallbladder.  This is not in the gallbladder neck.  There is normal common bile duct.  There is no gallbladder wall thickening.  There is a negative Murphy sign.  There is no pericholecystic fluid. LFTs wnl. Bilirubin wnl.  Repeat abdominal exam without tenderness.  Do not suspect acute cholecystitis.  Will refer to general surgery on outpatient basis for this. Do not feel patient needs emergent consultation.   Heart score 2 which places the patient at low risk. Pain is currently controlled. Vital signs have normalized. The evaluation does not show pathology that would require ongoing emergent intervention or inpatient treatment. I advised the patient to  follow-up with PCP this week. Referral to general surgery given. I advised the patient to return to the emergency department with new or worsening symptoms or new concerns. Specific return precautions discussed. The patient verbalized understanding and agreement with plan. All questions answered. No further questions at this time. The patient is hemodynamically stable, mentating appropriately and appears safe for discharge.  Final Clinical Impressions(s) / ED Diagnoses   Final diagnoses:  Abnormal CT of the abdomen  Atypical chest pain  Calculus of gallbladder without cholecystitis without obstruction    ED Discharge Orders    None       Jillyn Ledger, Hershal Coria 07/11/17 1851    Daleen Bo, MD 07/12/17 [phone]

## 2017-07-15 ENCOUNTER — Encounter: Payer: Self-pay | Admitting: Nurse Practitioner

## 2017-07-16 MED FILL — LABETALOL HCL 200 MG TABS: 200 | 30 days supply | Qty: 60 | Fill #1

## 2017-08-07 ENCOUNTER — Encounter: Payer: Self-pay | Admitting: Nurse Practitioner

## 2017-08-07 ENCOUNTER — Ambulatory Visit (INDEPENDENT_AMBULATORY_CARE_PROVIDER_SITE_OTHER): Payer: 59 | Admitting: Nurse Practitioner

## 2017-08-07 VITALS — BP 120/82 | HR 77 | Temp 98.2°F | Ht 67.0 in | Wt 308.0 lb

## 2017-08-07 DIAGNOSIS — G4719 Other hypersomnia: Secondary | ICD-10-CM | POA: Diagnosis not present

## 2017-08-07 DIAGNOSIS — F411 Generalized anxiety disorder: Secondary | ICD-10-CM

## 2017-08-07 DIAGNOSIS — I872 Venous insufficiency (chronic) (peripheral): Secondary | ICD-10-CM

## 2017-08-07 DIAGNOSIS — F988 Other specified behavioral and emotional disorders with onset usually occurring in childhood and adolescence: Secondary | ICD-10-CM | POA: Diagnosis not present

## 2017-08-07 DIAGNOSIS — I10 Essential (primary) hypertension: Secondary | ICD-10-CM

## 2017-08-07 MED ORDER — DEXTROAMPHETAMINE SULFATE ER 15 MG PO CP24: 15.0000 mg | ORAL_CAPSULE | Freq: Every day | ORAL | 0 refills | Status: DC

## 2017-08-07 MED ORDER — LABETALOL HCL 200 MG PO TABS: 200.0000 mg | ORAL_TABLET | Freq: Two times a day (BID) | ORAL | 1 refills | Status: DC

## 2017-08-07 MED ORDER — SERTRALINE HCL 100 MG PO TABS: 100.0000 mg | ORAL_TABLET | Freq: Every day | ORAL | 1 refills | Status: DC

## 2017-08-07 MED FILL — SERTRALINE HCL 100 MG TAB: 100 | 90 days supply | Qty: 90 | Fill #0

## 2017-08-07 NOTE — Progress Notes (Signed)
Subjective:  Patient ID: Cheryl Ayala, female    DOB: 09-12-1985  Age: 32 y.o. MRN: [id number]  CC: Follow-up (2 mo fu/ BP is better 120/72 as of today/ leg still swelling/refill for dexedrine? FYI will schedule PAP with GYN) and Cyst (west to ED and had CT done for chest pain, they found cyst in kidney but they didnt seem to be concern, is thi something to worry?Marland Kitchen )  HPI  HTN: Improved with addition of amlodipine Continues to take labetalol. She continues to maintain low sodium diet and small portions BP Readings from Last 3 Encounters:  08/07/17 120/82  07/11/17 130/85  07/10/17 (!) 154/104   Venous insufficiency: Improved with use of compression stocking as recommended by Dr. Bridgett Larsson. She continues to maintain low sodium diet and small portions. No exercise regimen at this time.  Depression and Anxiety: She resumed use of zoloft. Previously prescribed by Dr. Maudry Diego (previous pcp) She tried to wean off, but developed increased irritability. Depression screen PHQ 2/9 08/07/2017  Decreased Interest 0  Down, Depressed, Hopeless 0  PHQ - 2 Score 0   Cholelithiasis: Has upcoming appt with GI. Improved symptoms with diet modifications.  ADHD and Narcoplesy: Denies any adverse effects with dexedrine. Needs refills  Reviewed past Medical, Social and Family history today.  Outpatient Medications Prior to Visit  Medication Sig Dispense Refill  . amLODipine (NORVASC) 5 MG tablet Take 1 tablet (5 mg total) by mouth daily. 90 tablet 3  . dextroamphetamine (DEXEDRINE SPANSULE) 15 MG 24 hr capsule Take 1 capsule (15 mg total) by mouth daily. 30 capsule 0  . labetalol (NORMODYNE) 200 MG tablet Take 1 tablet (200 mg total) by mouth 2 (two) times daily. 60 tablet 2  . naproxen sodium (ANAPROX) 550 MG tablet Take 1 tablet (550 mg total) by mouth 2 (two) times daily with a meal. 28 tablet 0  . sertraline (ZOLOFT) 100 MG tablet Take 100 mg by mouth daily    . diclofenac (VOLTAREN) 75 MG EC  tablet Take 1 tablet (75 mg total) by mouth 2 (two) times daily. (Patient not taking: Reported on 07/03/2017) 50 tablet 2   No facility-administered medications prior to visit.     ROS See HPI  Objective:  BP 120/82   Pulse 77   Temp 98.2 F (36.8 C) (Oral)   Ht 5\' 7"  (1.702 m)   Wt (!) 308 lb (139.7 kg)   SpO2 97%   BMI 48.24 kg/m   BP Readings from Last 3 Encounters:  08/07/17 120/82  07/11/17 130/85  07/10/17 (!) 154/104    Wt Readings from Last 3 Encounters:  08/07/17 (!) 308 lb (139.7 kg)  07/10/17 (!) 312 lb (141.5 kg)  07/03/17 (!) 313 lb 3.2 oz (142.1 kg)    Physical Exam  Constitutional: She is oriented to person, place, and time.  Cardiovascular: Normal rate, regular rhythm and normal heart sounds.  Pulmonary/Chest: Effort normal.  Musculoskeletal: She exhibits edema. She exhibits no tenderness.  Neurological: She is alert and oriented to person, place, and time.  Skin: No erythema.  Psychiatric: She has a normal mood and affect. Her behavior is normal. Thought content normal.  Vitals reviewed.   Lab Results  Component Value Date   WBC 9.7 07/11/2017   HGB 12.3 07/11/2017   HCT 38.6 07/11/2017   PLT 393 07/11/2017   GLUCOSE 102 (H) 07/11/2017   ALT 19 07/11/2017   AST 23 07/11/2017   NA 140 07/11/2017   K 4.0  07/11/2017   CL 105 07/11/2017   CREATININE 0.83 07/11/2017   BUN 11 07/11/2017   CO2 25 07/11/2017   TSH 2.780 07/10/2017    Dg Chest 2 View  Result Date: 07/11/2017 CLINICAL DATA:  Left side chest pain today, pain is sharp and constant - hx of htn, nonsmoker, no known heart or lung issues EXAM: CHEST - 2 VIEW COMPARISON:  none FINDINGS: Lungs are clear. Heart size and mediastinal contours are within normal limits. No effusion.  No pneumothorax. Visualized bones unremarkable. IMPRESSION: No acute cardiopulmonary disease. Electronically Signed   By: Lucrezia Europe M.D.   On: 07/11/2017 12:37   Ct Angio Chest/abd/pel For Dissection W And/or  W/wo  Result Date: 07/11/2017 CLINICAL DATA:  Mid chest pain beginning this morning radiating to the back. History of hypertension. Family history of aortic aneurysm. EXAM: CT ANGIOGRAPHY CHEST, ABDOMEN AND PELVIS TECHNIQUE: Multidetector CT imaging through the chest, abdomen and pelvis was performed using the standard protocol during bolus administration of intravenous contrast. Multiplanar reconstructed images and MIPs were obtained and reviewed to evaluate the vascular anatomy. CONTRAST:  163mL ISOVUE-370 IOPAMIDOL (ISOVUE-370) INJECTION 76% COMPARISON:  Current chest radiographs. FINDINGS: CTA CHEST FINDINGS Cardiovascular: The thoracic aorta is normal in caliber. No dissection or aneurysm. No atherosclerotic plaque. Heart is normal in size and configuration. No pericardial effusion. No coronary artery calcifications. Pulmonary arteries are normal in caliber. Mediastinum/Nodes: No enlarged mediastinal, hilar, or axillary lymph nodes. Thyroid gland, trachea, and esophagus demonstrate no significant findings. Lungs/Pleura: Minimal pleural effusions. Single small air cysts noted in each lower lobe. Minimal dependent subsegmental atelectasis. Lungs otherwise clear with no evidence of pneumonia or pulmonary edema. No pneumothorax. Review of the MIP images confirms the above findings. CTA ABDOMEN AND PELVIS FINDINGS VASCULAR Aorta: Normal caliber aorta without aneurysm, dissection, vasculitis or significant stenosis. Celiac: Patent without evidence of aneurysm, dissection, vasculitis or significant stenosis. SMA: Patent without evidence of aneurysm, dissection, vasculitis or significant stenosis. Renals: 2 renal arteries on the right. Single left renal artery. Renal arteries are widely patent with no dissection, aneurysm or vasculitis. IMA: Patent without evidence of aneurysm, dissection, vasculitis or significant stenosis. Inflow: Patent without evidence of aneurysm, dissection, vasculitis or significant stenosis.  Veins: No obvious venous abnormality within the limitations of this arterial phase study. Review of the MIP images confirms the above findings. NON-VASCULAR Hepatobiliary: Liver normal in size. Decreased attenuation of the liver is consistent with hepatic steatosis. No liver mass or focal lesion. 2.2 cm gallstone in the gallbladder neck. There is mild hazy infiltration of the pericholecystic fat. Despite the gallbladder only being mildly distended, the findings support early acute cholecystitis in the proper clinical setting. No bile duct dilation. Pancreas: Unremarkable. No pancreatic ductal dilatation or surrounding inflammatory changes. Spleen: Normal in size without focal abnormality. Adrenals/Urinary Tract: No adrenal masses. Kidneys normal size, orientation and position. 13 mm low-density left upper pole renal mass consistent with a cyst. No other renal masses, no stones and no hydronephrosis. Normal ureters. Normal bladder. Stomach/Bowel: Stomach is within normal limits. Appendix appears normal. No evidence of bowel wall thickening, distention, or inflammatory changes. Lymphatic: No enlarged lymph nodes. Reproductive: Uterus and bilateral adnexa are unremarkable. Other: No abdominal wall hernia or abnormality. No abdominopelvic ascites. MUSCULOSKELETAL FINDINGS No fracture or acute finding. No osteoblastic or osteolytic lesions. No significant skeletal abnormality. Review of the MIP images confirms the above findings. IMPRESSION: 1. Normal thoracoabdominal aorta. Widely patent branch vessels with no atherosclerosis. 2. 2.2 cm gallstone. Mild pericholecystic  inflammation. Suspect early acute cholecystitis as the cause of this patient's pain. Consider follow-up right upper quadrant ultrasound for further assessment. 3. No other evidence of an acute abnormality. Electronically Signed   By: Lajean Manes M.D.   On: 07/11/2017 15:24   US Abdomen Limited Ruq  Result Date: 07/11/2017 CLINICAL DATA:   Cholelithiasis on CT chest EXAM: ULTRASOUND ABDOMEN LIMITED RIGHT UPPER QUADRANT COMPARISON:  CT from earlier the same day FINDINGS: Gallbladder: 2.2 cm shadowing gallstone in the gallbladder near the neck. There is no gallbladder wall thickening. Sonographer describes no sonographic Murphy's sign. No pericholecystic fluid. Common bile duct: Diameter: 2.7 mm Liver: No focal lesion identified. Within normal limits in parenchymal echogenicity. Portal vein is patent on color Doppler imaging with normal direction of blood flow towards the liver. IMPRESSION: 1. Cholelithiasis without other ultrasound evidence of cholecystitis or biliary obstruction. Electronically Signed   By: Lucrezia Europe M.D.   On: 07/11/2017 16:35    Assessment & Plan:   Hebah was seen today for follow-up and cyst.  Diagnoses and all orders for this visit:  Benign essential hypertension -     labetalol (NORMODYNE) 200 MG tablet; Take 1 tablet (200 mg total) by mouth 2 (two) times daily.  Attention deficit disorder (ADD) without hyperactivity -     Discontinue: dextroamphetamine (DEXEDRINE SPANSULE) 15 MG 24 hr capsule; Take 1 capsule (15 mg total) by mouth daily. -     dextroamphetamine (DEXEDRINE SPANSULE) 15 MG 24 hr capsule; Take 1 capsule (15 mg total) by mouth daily.  Excessive daytime sleepiness -     Discontinue: dextroamphetamine (DEXEDRINE SPANSULE) 15 MG 24 hr capsule; Take 1 capsule (15 mg total) by mouth daily. -     dextroamphetamine (DEXEDRINE SPANSULE) 15 MG 24 hr capsule; Take 1 capsule (15 mg total) by mouth daily.  Chronic venous insufficiency  Generalized anxiety disorder -     sertraline (ZOLOFT) 100 MG tablet; Take 1 tablet (100 mg total) by mouth daily.   I have discontinued Maika L. Alamo's diclofenac and naproxen sodium. I have also changed her sertraline. Additionally, I am having her maintain her amLODipine, labetalol, and dextroamphetamine.  Meds ordered this encounter  Medications  .  labetalol (NORMODYNE) 200 MG tablet    Sig: Take 1 tablet (200 mg total) by mouth 2 (two) times daily.    Dispense:  180 tablet    Refill:  1    Order Specific Question:   Supervising Provider    Answer:   Lucille Passy [[id number]]  . sertraline (ZOLOFT) 100 MG tablet    Sig: Take 1 tablet (100 mg total) by mouth daily.    Dispense:  90 tablet    Refill:  1    Order Specific Question:   Supervising Provider    Answer:   Lucille Passy [[id number]]  . DISCONTD: dextroamphetamine (DEXEDRINE SPANSULE) 15 MG 24 hr capsule    Sig: Take 1 capsule (15 mg total) by mouth daily.    Dispense:  30 capsule    Refill:  0    Order Specific Question:   Supervising Provider    Answer:   Lucille Passy [[id number]]  . dextroamphetamine (DEXEDRINE SPANSULE) 15 MG 24 hr capsule    Sig: Take 1 capsule (15 mg total) by mouth daily.    Dispense:  30 capsule    Refill:  0    Do not fill before 09/07/2017    Order Specific Question:   Supervising Provider  Answer:   Lucille Passy [[id number]]    Follow-up: Return in about 6 months (around 02/07/2018) for CPE (fasting).  Wilfred Lacy, NP

## 2017-08-07 NOTE — Patient Instructions (Addendum)
She was provided with Dexedrine Rx x 58months (07/24-09/24/2019).  Start daily exercise (like walking 30-31mins per day) Maintain DASH diet and small meal portions. Continue use of compression stocking as advised by Dr. Maudry Diego.  Based on size and characteristics of renal cyst, it appears benign hence no further evaluation is recommended at this time.   Make appt with GYN for repeat PAP.  Chronic Venous Insufficiency Chronic venous insufficiency, also called venous stasis, is a condition that prevents blood from being pumped effectively through the veins in your legs. Blood may no longer be pumped effectively from the legs back to the heart. This condition can range from mild to severe. With proper treatment, you should be able to continue with an active life. What are the causes? Chronic venous insufficiency occurs when the vein walls become stretched, weakened, or damaged, or when valves within the vein are damaged. Some common causes of this include:  High blood pressure inside the veins (venous hypertension).  Increased blood pressure in the leg veins from long periods of sitting or standing.  A blood clot that blocks blood flow in a vein (deep vein thrombosis, DVT).  Inflammation of a vein (phlebitis) that causes a blood clot to form.  Tumors in the pelvis that cause blood to back up.  What increases the risk? The following factors may make you more likely to develop this condition:  Having a family history of this condition.  Obesity.  Pregnancy.  Living without enough physical activity or exercise (sedentary lifestyle).  Smoking.  Having a job that requires long periods of standing or sitting in one place.  Being a certain age. Women in their 78s and 33s and men in their 53s are more likely to develop this condition.  What are the signs or symptoms? Symptoms of this condition include:  Veins that are enlarged, bulging, or twisted (varicose veins).  Skin breakdown  or ulcers.  Reddened or discolored skin on the front of the leg.  Brown, smooth, tight, and painful skin just above the ankle, usually on the inside of the leg (lipodermatosclerosis).  Swelling.  How is this diagnosed? This condition may be diagnosed based on:  Your medical history.  A physical exam.  Tests, such as: ? A procedure that creates an image of a blood vessel and nearby organs and provides information about blood flow through the blood vessel (duplex ultrasound). ? A procedure that tests blood flow (plethysmography). ? A procedure to look at the veins using X-ray and dye (venogram).  How is this treated? The goals of treatment are to help you return to an active life and to minimize pain or disability. Treatment depends on the severity of your condition, and it may include:  Wearing compression stockings. These can help relieve symptoms and help prevent your condition from getting worse. However, they do not cure the condition.  Sclerotherapy. This is a procedure involving an injection of a material that "dissolves" damaged veins.  Surgery. This may involve: ? Removing a diseased vein (vein stripping). ? Cutting off blood flow through the vein (laser ablation surgery). ? Repairing a valve.  Follow these instructions at home:  Wear compression stockings as told by your health care provider. These stockings help to prevent blood clots and reduce swelling in your legs.  Take over-the-counter and prescription medicines only as told by your health care provider.  Stay active by exercising, walking, or doing different activities. Ask your health care provider what activities are safe for you and  how much exercise you need.  Drink enough fluid to keep your urine clear or pale yellow.  Do not use any products that contain nicotine or tobacco, such as cigarettes and e-cigarettes. If you need help quitting, ask your health care provider.  Keep all follow-up visits as told  by your health care provider. This is important. Contact a health care provider if:  You have redness, swelling, or more pain in the affected area.  You see a red streak or line that extends up or down from the affected area.  You have skin breakdown or a loss of skin in the affected area, even if the breakdown is small.  You get an injury in the affected area. Get help right away if:  You get an injury and an open wound in the affected area.  You have severe pain that does not get better with medicine.  You have sudden numbness or weakness in the foot or ankle below the affected area, or you have trouble moving your foot or ankle.  You have a fever and you have worse or persistent symptoms.  You have chest pain.  You have shortness of breath. Summary  Chronic venous insufficiency, also called venous stasis, is a condition that prevents blood from being pumped effectively through the veins in your legs.  Chronic venous insufficiency occurs when the vein walls become stretched, weakened, or damaged, or when valves within the vein are damaged.  Treatment for this condition depends on how severe your condition is, and it may involve wearing compression stockings or having a procedure.  Make sure you stay active by exercising, walking, or doing different activities. Ask your health care provider what activities are safe for you and how much exercise you need. This information is not intended to replace advice given to you by your health care provider. Make sure you discuss any questions you have with your health care provider. Document Released: 05/07/2006 Document Revised: 11/21/2015 Document Reviewed: 11/21/2015 Elsevier Interactive Patient Education  2017 [employer].

## 2017-08-09 ENCOUNTER — Ambulatory Visit (INDEPENDENT_AMBULATORY_CARE_PROVIDER_SITE_OTHER): Payer: 59 | Admitting: Gastroenterology

## 2017-08-09 ENCOUNTER — Encounter: Payer: Self-pay | Admitting: Gastroenterology

## 2017-08-09 VITALS — BP 118/70 | HR 62 | Ht 67.0 in | Wt 308.0 lb

## 2017-08-09 DIAGNOSIS — R1310 Dysphagia, unspecified: Secondary | ICD-10-CM

## 2017-08-09 DIAGNOSIS — K219 Gastro-esophageal reflux disease without esophagitis: Secondary | ICD-10-CM

## 2017-08-09 DIAGNOSIS — Z8371 Family history of colonic polyps: Secondary | ICD-10-CM

## 2017-08-09 DIAGNOSIS — K580 Irritable bowel syndrome with diarrhea: Secondary | ICD-10-CM | POA: Diagnosis not present

## 2017-08-09 DIAGNOSIS — Z83719 Family history of colon polyps, unspecified: Secondary | ICD-10-CM

## 2017-08-09 MED ORDER — COLESTIPOL HCL 1 G PO TABS: 1.0000 g | ORAL_TABLET | Freq: Two times a day (BID) | ORAL | 2 refills | Status: DC

## 2017-08-09 MED ORDER — NA SULFATE-K SULFATE-MG SULF 17.5-3.13-1.6 GM/177ML PO SOLN: ORAL | 0 refills | Status: DC

## 2017-08-09 MED ORDER — DEXLANSOPRAZOLE 60 MG PO CPDR: 60.0000 mg | DELAYED_RELEASE_CAPSULE | Freq: Every day | ORAL | 3 refills | Status: DC

## 2017-08-09 MED ORDER — RANITIDINE HCL 150 MG PO TABS: 150.0000 mg | ORAL_TABLET | Freq: Every day | ORAL | 3 refills | Status: DC

## 2017-08-09 MED FILL — D-AMPHETAMINE ER 15 MG CAP: 15 | 30 days supply | Qty: 30 | Fill #0

## 2017-08-09 MED FILL — LABETALOL HCL 200 MG TABS: 200 | 90 days supply | Qty: 180 | Fill #0

## 2017-08-09 NOTE — Patient Instructions (Signed)
You have been scheduled for a Barium Esophogram at [facility] (1st floor of the hospital) on 08/21/2017 at 11am. Please arrive 15 minutes prior to your appointment for registration. Make certain not to have anything to eat or drink 6 hours prior to your test. If you need to reschedule for any reason, please contact radiology at [phone] to do so. __________________________________________________________________ A barium swallow is an examination that concentrates on views of the esophagus. This tends to be a double contrast exam (barium and two liquids which, when combined, create a gas to distend the wall of the oesophagus) or single contrast (non-ionic iodine based). The study is usually tailored to your symptoms so a good history is essential. Attention is paid during the study to the form, structure and configuration of the esophagus, looking for functional disorders (such as aspiration, dysphagia, achalasia, motility and reflux) EXAMINATION You may be asked to change into a gown, depending on the type of swallow being performed. A radiologist and radiographer will perform the procedure. The radiologist will advise you of the type of contrast selected for your procedure and direct you during the exam. You will be asked to stand, sit or lie in several different positions and to hold a small amount of fluid in your mouth before being asked to swallow while the imaging is performed .In some instances you may be asked to swallow barium coated marshmallows to assess the motility of a solid food bolus. The exam can be recorded as a digital or video fluoroscopy procedure. POST PROCEDURE It will take 1-2 days for the barium to pass through your system. To facilitate this, it is important, unless otherwise directed, to increase your fluids for the next 24-48hrs and to resume your normal diet.  This test typically takes about 30 minutes to  perform. __________________________________________________________________________________  Cheryl Ayala have been scheduled for a colonoscopy. Please follow written instructions given to you at your visit today.  Please pick up your prep supplies at the pharmacy within the next 1-3 days. If you use inhalers (even only as needed), please bring them with you on the day of your procedure. Your physician has requested that you go to www.startemmi.com and enter the access code given to you at your visit today. This web site gives a general overview about your procedure. However, you should still follow specific instructions given to you by our office regarding your preparation for the procedure.   We will send Colestid Dexilant and Zantac to your pharmacy   Thank you for choosing [facility]  Kavitha Nandigam,MD

## 2017-08-09 NOTE — Progress Notes (Signed)
Cheryl Ayala    [id number]    Mar 03, 1985  Primary Care Physician:Nche, Charlene Brooke, NP  Referring Physician: Flossie Buffy, NP [address] [place], [place] [address]  Chief complaint:  Dysphagia, family history of colon cancer , IBS  HPI:  32 yr F with obesity, chronic GERD here with complaints of intermittent solid food dysphagia. 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  She has breakthrough heartburn especially after meals.  Has been having trouble swallowing, intermittent choking with solid food.  Denies any food impactions are episodes of regurgitation. Patient is requesting colonoscopy for colorectal cancer screening as her mother had large adenomatous polyps at age 76, was a diagnostic colonoscopy as she was having GI issues at the time.  Denies any family history of colorectal cancer.  Her sister also had polyps in her 4s. Grandmother had breast cancer Denies any dark stool or rectal bleeding Patient also reported intermittent IBS symptoms with alternating diarrhea and constipation No loss of appetite, nausea, vomiting or weight loss  Outpatient Encounter Medications as of 08/09/2017  Medication Sig  . amLODipine (NORVASC) 5 MG tablet Take 1 tablet (5 mg total) by mouth daily.  Derrill Memo ON 09/07/2017] dextroamphetamine (DEXEDRINE SPANSULE) 15 MG 24 hr capsule Take 1 capsule (15 mg total) by mouth daily.  Marland Kitchen labetalol (NORMODYNE) 200 MG tablet Take 1 tablet (200 mg total) by mouth 2 (two) times daily.  . sertraline (ZOLOFT) 100 MG tablet Take 1 tablet (100 mg total) by mouth daily.   No facility-administered encounter medications on file as of 08/09/2017.     Allergies as of 08/09/2017 - Review Complete 08/09/2017  Allergen Reaction Noted  . Penicillins Rash 12/12/2016  . Sulfa antibiotics Rash 12/12/2016    Past Medical History:  Diagnosis Date  . Anxiety   . Hypertension      Past Surgical History:  Procedure Laterality Date  . WISDOM TOOTH EXTRACTION  2013    Family History  Problem Relation Age of Onset  . Cancer Mother   . Hypertension Father   . Hyperlipidemia Father   . Anxiety disorder Father   . Depression Father   . Heart disease Father   . AAA (abdominal aortic aneurysm) Father   . Depression Sister   . Anxiety disorder Sister   . Heart disease Maternal Grandmother   . Alzheimer's disease Maternal Grandmother   . Stroke Maternal Grandmother   . Gout Maternal Grandfather   . Heart disease Paternal Grandfather     Social History   Socioeconomic History  . Marital status: Married    Spouse name: Not on file  . Number of children: Not on file  . Years of education: Not on file  . Highest education level: Not on file  Occupational History  . Not on file  Social Needs  . Financial resource strain: Not on file  . Food insecurity:    Worry: Not on file    Inability: Not on file  . Transportation needs:    Medical: Not on file    Non-medical: Not on file  Tobacco Use  . Smoking status: Never Smoker  . Smokeless tobacco: Never Used  Substance and Sexual Activity  . Alcohol use: Yes    Frequency: Never    Comment: once every 6 months   . Drug use: Never  . Sexual activity: Not on file  Lifestyle  . Physical activity:    Days per week: Not on file    Minutes per session: Not on file  . Stress: Not on file  Relationships  . Social connections:    Talks on phone: Not on file    Gets together: Not on file    Attends religious service: Not on file    Active member of club or organization: Not on file    Attends meetings of clubs or organizations: Not on file    Relationship status: Not on file  . Intimate partner violence:    Fear of current or ex partner: Not on file    Emotionally abused: Not on file    Physically abused: Not on file    Forced sexual activity: Not on file  Other Topics Concern  . Not on file  Social  History Narrative  . Not on file      Review of systems: Review of Systems  Constitutional: Negative for fever and chills.  HENT: Negative.   Eyes: Negative for blurred vision.  Respiratory: Negative for cough, shortness of breath and wheezing.   Cardiovascular: Negative for chest pain and palpitations.  Gastrointestinal: as per HPI Genitourinary: Negative for dysuria, urgency, frequency and hematuria.  Musculoskeletal: Negative for myalgias, back pain and joint pain.  Skin: Negative for itching and rash.  Neurological: Negative for dizziness, tremors, focal weakness, seizures and loss of consciousness.  Endo/Heme/Allergies: Positive for seasonal allergies.  Psychiatric/Behavioral: Negative for depression, suicidal ideas and hallucinations.  All other systems reviewed and are negative.   Physical Exam: Vitals:   08/09/17 0829  BP: 118/70  Pulse: 62   Body mass index is 48.24 kg/m. Gen:      No acute distress HEENT:  EOMI, sclera anicteric Neck:     No masses; no thyromegaly Lungs:    Clear to auscultation bilaterally; normal respiratory effort CV:         Regular rate and rhythm; no murmurs Abd:      + bowel sounds; soft, non-tender; no palpable masses, no distension Ext:    No edema; adequate peripheral perfusion Skin:      Warm and dry; no rash Neuro: alert and oriented x 3 Psych: normal mood and affect  Data Reviewed:  Reviewed labs, radiology imaging, old records and pertinent past GI work up   Assessment and Plan/Recommendations:  32 year old female with morbid obesity, chronic GERD with complaints of intermittent solid dysphagia  GERD: Discussed antireflux measures and lifestyle modifications in detail Dexilant 60 mg daily, 30 minutes before breakfast and Zantac at bedtime as needed  Schedule for barium esophagram to evaluate for dysphagia. Differential includes hiatal hernia, peptic stricture or esophageal dysmotility and will proceed with EGD based on  findings on barium study No weight loss or history of food impactions  Patient is requesting colonoscopy for colorectal cancer screening given family history of large adenomatous polyps in her mother at age 76 Patient is at higher risk for colorectal cancer based on family history We will proceed with colonoscopy   IBS with alternating constipation and diarrhea Lactose free diet trial Increase dietary fiber and  Fluid intake  The risks and benefits as well as alternatives of endoscopic procedure(s) have been discussed and reviewed. All questions answered. The patient agrees to proceed.     Damaris Hippo , MD [phone]    CC: Nche, Charlene Brooke, NP

## 2017-08-12 ENCOUNTER — Encounter: Payer: Self-pay | Admitting: Gastroenterology

## 2017-08-16 ENCOUNTER — Telehealth: Payer: Self-pay | Admitting: *Deleted

## 2017-08-16 NOTE — Telephone Encounter (Signed)
Discuss with patient if she has tried the preferred medications  And if she is getting dexilant samples from her work

## 2017-08-21 ENCOUNTER — Ambulatory Visit (HOSPITAL_COMMUNITY): Payer: 59

## 2017-08-21 DIAGNOSIS — K8020 Calculus of gallbladder without cholecystitis without obstruction: Secondary | ICD-10-CM | POA: Diagnosis not present

## 2017-09-10 ENCOUNTER — Encounter: Payer: 59 | Admitting: Gastroenterology

## 2017-09-11 ENCOUNTER — Ambulatory Visit (HOSPITAL_COMMUNITY)
Admission: RE | Admit: 2017-09-11 | Discharge: 2017-09-11 | Disposition: A | Discharge status: Home / Self Care | Payer: 59 | Source: Ambulatory Visit | Attending: Gastroenterology | Admitting: Gastroenterology

## 2017-09-11 DIAGNOSIS — R1310 Dysphagia, unspecified: Secondary | ICD-10-CM | POA: Insufficient documentation

## 2017-09-11 DIAGNOSIS — K219 Gastro-esophageal reflux disease without esophagitis: Secondary | ICD-10-CM | POA: Insufficient documentation

## 2017-09-11 DIAGNOSIS — K224 Dyskinesia of esophagus: Secondary | ICD-10-CM | POA: Diagnosis not present

## 2017-09-12 ENCOUNTER — Telehealth: Payer: Self-pay | Admitting: Gastroenterology

## 2017-09-13 ENCOUNTER — Other Ambulatory Visit: Payer: Self-pay

## 2017-09-13 DIAGNOSIS — K224 Dyskinesia of esophagus: Secondary | ICD-10-CM

## 2017-09-13 NOTE — Telephone Encounter (Signed)
Calling about the DG esophagus. Scheduled for eshophageal mano 09/25/17.

## 2017-09-18 ENCOUNTER — Encounter: Payer: 59 | Admitting: Gastroenterology

## 2017-09-25 ENCOUNTER — Ambulatory Visit (HOSPITAL_COMMUNITY)
Admission: RE | Admit: 2017-09-25 | Discharge: 2017-09-25 | Disposition: A | Discharge status: Home / Self Care | Payer: 59 | Source: Ambulatory Visit | Attending: Gastroenterology | Admitting: Gastroenterology

## 2017-09-25 ENCOUNTER — Encounter (HOSPITAL_COMMUNITY)
Admission: RE | Disposition: A | Discharge status: Home / Self Care | Payer: Self-pay | Source: Ambulatory Visit | Attending: Gastroenterology

## 2017-09-25 DIAGNOSIS — K224 Dyskinesia of esophagus: Secondary | ICD-10-CM | POA: Insufficient documentation

## 2017-09-25 DIAGNOSIS — R1310 Dysphagia, unspecified: Secondary | ICD-10-CM | POA: Diagnosis not present

## 2017-09-25 DIAGNOSIS — R1319 Other dysphagia: Secondary | ICD-10-CM

## 2017-09-25 HISTORY — PX: ESOPHAGEAL MANOMETRY: SHX5429

## 2017-09-25 SURGERY — MANOMETRY, ESOPHAGUS

## 2017-09-25 SURGICAL SUPPLY — 2 items
FACESHIELD LNG OPTICON STERILE (SAFETY) IMPLANT
GLOVE BIO SURGEON STRL SZ8 (GLOVE) ×4 IMPLANT

## 2017-09-25 NOTE — Progress Notes (Signed)
Esophageal Manometry done per protocol. Pt tolerated fair.Pt coughed a lot at first when probe in and spit up clear liquid several times. Pt calmed down.2nd RN was at bedside to help hold cup and give support. Pt did well once getting used to the probe. Study completed without complication.

## 2017-09-26 ENCOUNTER — Encounter (HOSPITAL_COMMUNITY): Payer: Self-pay | Admitting: Gastroenterology

## 2017-09-26 MED FILL — D-AMPHETAMINE ER 15 MG CAP: 15 | 30 days supply | Qty: 30 | Fill #0

## 2017-10-02 ENCOUNTER — Ambulatory Visit: Payer: 59 | Admitting: Vascular Surgery

## 2017-10-09 DIAGNOSIS — R1310 Dysphagia, unspecified: Secondary | ICD-10-CM

## 2017-10-09 DIAGNOSIS — R1319 Other dysphagia: Secondary | ICD-10-CM

## 2017-10-10 ENCOUNTER — Other Ambulatory Visit: Payer: Self-pay

## 2017-10-10 DIAGNOSIS — K219 Gastro-esophageal reflux disease without esophagitis: Secondary | ICD-10-CM

## 2017-10-10 MED ORDER — OMEPRAZOLE 40 MG PO CPDR: DELAYED_RELEASE_CAPSULE | ORAL | 3 refills | Status: DC

## 2017-10-10 MED FILL — OMEPRAZOLE 40 MG CPDR: 40 | 30 days supply | Qty: 30 | Fill #0

## 2017-10-10 MED FILL — AMLODIPINE BESYLATE 5 MG TA: 5 | 90 days supply | Qty: 90 | Fill #1

## 2017-10-23 DIAGNOSIS — F331 Major depressive disorder, recurrent, moderate: Secondary | ICD-10-CM | POA: Diagnosis not present

## 2017-10-23 DIAGNOSIS — F419 Anxiety disorder, unspecified: Secondary | ICD-10-CM | POA: Diagnosis not present

## 2017-10-23 DIAGNOSIS — F900 Attention-deficit hyperactivity disorder, predominantly inattentive type: Secondary | ICD-10-CM | POA: Diagnosis not present

## 2017-10-24 MED FILL — SERTRALINE HCL 100 MG TAB: 100 | 90 days supply | Qty: 90 | Fill #1

## 2017-10-28 ENCOUNTER — Encounter: Payer: Self-pay | Admitting: Nurse Practitioner

## 2017-10-28 DIAGNOSIS — G4719 Other hypersomnia: Secondary | ICD-10-CM

## 2017-10-28 DIAGNOSIS — F988 Other specified behavioral and emotional disorders with onset usually occurring in childhood and adolescence: Secondary | ICD-10-CM

## 2017-10-28 MED ORDER — DEXTROAMPHETAMINE SULFATE ER 15 MG PO CP24
15.0000 mg | ORAL_CAPSULE | Freq: Every day | ORAL | 0 refills | Status: DC
Start: 2017-10-28 — End: 2017-12-16

## 2017-10-28 NOTE — Telephone Encounter (Signed)
Please advise, ok to refill?   PMP checked: last pick up at [facility] was 07/03/17, 08/09/17 and 09/26/17 for 30 days supply.

## 2017-10-29 MED FILL — D-AMPHETAMINE ER 15 MG CAP: 15 | 30 days supply | Qty: 30 | Fill #0

## 2017-10-30 ENCOUNTER — Ambulatory Visit (INDEPENDENT_AMBULATORY_CARE_PROVIDER_SITE_OTHER): Payer: 59

## 2017-10-30 ENCOUNTER — Encounter: Payer: Self-pay | Admitting: Podiatry

## 2017-10-30 ENCOUNTER — Ambulatory Visit (INDEPENDENT_AMBULATORY_CARE_PROVIDER_SITE_OTHER): Payer: 59 | Admitting: Podiatry

## 2017-10-30 ENCOUNTER — Ambulatory Visit: Payer: 59

## 2017-10-30 ENCOUNTER — Other Ambulatory Visit: Payer: Self-pay | Admitting: Podiatry

## 2017-10-30 DIAGNOSIS — M79672 Pain in left foot: Secondary | ICD-10-CM

## 2017-10-30 DIAGNOSIS — M79671 Pain in right foot: Secondary | ICD-10-CM

## 2017-10-30 DIAGNOSIS — M722 Plantar fascial fibromatosis: Secondary | ICD-10-CM

## 2017-10-30 MED ORDER — TRIAMCINOLONE ACETONIDE 10 MG/ML IJ SUSP
10.0000 mg | Freq: Once | INTRAMUSCULAR | Status: AC
  Administered 2017-10-30: 10 mg

## 2017-10-30 NOTE — Progress Notes (Signed)
Subjective:   Patient ID: Cheryl Ayala, female   DOB: 32 y.o.   MRN: [id number]   HPI Patient presents stating that her heels are really started to bother her again and she admits she has not been wearing the orthotics as we had wanted and states she is going on a cruise and she cannot walk the way she wants   ROS      Objective:  Physical Exam  Recurrence acute plantar fasciitis bilateral     Assessment:  H&P condition reviewed and discussed chronic plantar fasciitis     Plan:  At this point I do think I want to review her orthotics which she will bring at next visit I went ahead and I reinjected the plantar fascia 3 mg Kenalog 5 Milgram Xylocaine bilateral and she will be seen back to reevaluate

## 2017-10-31 ENCOUNTER — Encounter: Payer: Self-pay | Admitting: Vascular Surgery

## 2017-10-31 ENCOUNTER — Ambulatory Visit (INDEPENDENT_AMBULATORY_CARE_PROVIDER_SITE_OTHER): Payer: 59 | Admitting: Vascular Surgery

## 2017-10-31 VITALS — BP 124/87 | HR 82 | Temp 98.3°F | Resp 16 | Ht 67.0 in | Wt 305.0 lb

## 2017-10-31 DIAGNOSIS — I83893 Varicose veins of bilateral lower extremities with other complications: Secondary | ICD-10-CM | POA: Diagnosis not present

## 2017-10-31 DIAGNOSIS — I872 Venous insufficiency (chronic) (peripheral): Secondary | ICD-10-CM | POA: Diagnosis not present

## 2017-10-31 NOTE — Progress Notes (Signed)
Patient name: Cheryl Ayala MRN: [id number] DOB: 10/24/1985 Sex: female  REASON FOR VISIT:   77-monthfollow-up visit.  HPI:   Cheryl FABRIZIOis a pleasant 32y.o. female who was seen by Dr. BAdele Barthelon 07/03/2017 with bilateral lower extremity swelling.  This had been going on for 2 months.  Patient has no history of DVT.  Patient had been using knee-high compression stockings in the past.  Noninvasive studies at that time showed reflux in the right great saphenous vein.  In addition there was reflux in the left great saphenous vein.  The patient was written a prescription for thigh-high compression stockings with a gradient of 20 to 30 mmHg.  She was to return in 3 months for follow-up.  Since the patient was seen last, she continues to have aching pain and heaviness in her legs which is aggravated by standing and relieved somewhat with elevation.  She has been wearing her thigh-high compression stockings without much relief of her symptoms.  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  She is unaware of any previous history of DVT or phlebitis.  Past Medical History:  Diagnosis Date  . Anxiety   . Hypertension     Family History  Problem Relation Age of Onset  . Cancer Mother   . Hypertension Father   . Hyperlipidemia Father   . Anxiety disorder Father   . Depression Father   . Heart disease Father   . AAA (abdominal aortic aneurysm) Father   . Depression Sister   . Anxiety disorder Sister   . Heart disease Maternal Grandmother   . Alzheimer's disease Maternal Grandmother   . Stroke Maternal Grandmother   . Gout Maternal Grandfather   . Heart disease Paternal Grandfather     SOCIAL HISTORY: Social History   Tobacco Use  . Smoking status: Never Smoker  . Smokeless tobacco: Never Used  Substance Use Topics  . Alcohol use: Yes    Frequency: Never    Comment: once every 6 months     Allergies  Allergen Reactions  . Penicillins Rash    Has patient  had a PCN reaction causing immediate rash, facial/tongue/throat swelling, SOB or lightheadedness with hypotension: Yes Has patient had a PCN reaction causing severe rash involving mucus membranes or skin necrosis: No Has patient had a PCN reaction that required hospitalization: No Has patient had a PCN reaction occurring within the last 10 years: No If all of the above answers are "NO", then may proceed with Cephalosporin use.   . Sulfa Antibiotics Rash    Current Outpatient Medications  Medication Sig Dispense Refill  . amLODipine (NORVASC) 5 MG tablet Take 1 tablet (5 mg total) by mouth daily. 90 tablet 3  . dextroamphetamine (DEXEDRINE SPANSULE) 15 MG 24 hr capsule Take 1 capsule (15 mg total) by mouth daily. 30 capsule 0  . labetalol (NORMODYNE) 200 MG tablet Take 1 tablet (200 mg total) by mouth 2 (two) times daily. 180 tablet 1  . omeprazole (PRILOSEC) 40 MG capsule Take 30 minutes before the first meal of the day 30 capsule 3  . sertraline (ZOLOFT) 100 MG tablet Take 1 tablet (100 mg total) by mouth daily. 90 tablet 1  . CLOMIPHENE CITRATE PO Take by mouth.    . colestipol (COLESTID) 1 g tablet Take 1 tablet (1 g total) by mouth 2 (two) times daily. (Patient not taking: Reported on 10/31/2017) 60 tablet 2  .  dexlansoprazole (DEXILANT) 60 MG capsule Take 1 capsule (60 mg total) by mouth daily. (Patient not taking: Reported on 10/31/2017) 30 capsule 3  . human chorionic gonadotropin (PREGNYL/NOVAREL) 10000 units injection Inject 10,000 units when instructed by nurse or physician.    . Na Sulfate-K Sulfate-Mg Sulf 17.5-3.13-1.6 GM/177ML SOLN 1 kit (Patient not taking: Reported on 10/31/2017) 354 mL 0  . ranitidine (ZANTAC) 150 MG tablet Take 1 tablet (150 mg total) by mouth at bedtime. (Patient not taking: Reported on 10/31/2017) 30 tablet 3   No current facility-administered medications for this visit.     REVIEW OF SYSTEMS:  _0  denotes positive finding, _1  denotes negative  finding Cardiac  Comments:  Chest pain or chest pressure:    Shortness of breath upon exertion:    Short of breath when lying flat:    Irregular heart rhythm:        Vascular    Pain in calf, thigh, or hip brought on by ambulation:    Pain in feet at night that wakes you up from your sleep:     Blood clot in your veins:    Leg swelling:  x       Pulmonary    Oxygen at home:    Productive cough:     Wheezing:         Neurologic    Sudden weakness in arms or legs:     Sudden numbness in arms or legs:     Sudden onset of difficulty speaking or slurred speech:    Temporary loss of vision in one eye:     Problems with dizziness:         Gastrointestinal    Blood in stool:     Vomited blood:         Genitourinary    Burning when urinating:     Blood in urine:        Psychiatric    Major depression:         Hematologic    Bleeding problems:    Problems with blood clotting too easily:        Skin    Rashes or ulcers:        Constitutional    Fever or chills:     PHYSICAL EXAM:   Vitals:   10/31/17 1304  BP: 124/87  Pulse: 82  Resp: 16  Temp: 98.3 F (36.8 C)  SpO2: 96%  Weight: (!) 305 lb (138.3 kg)  Height: _2  (1.702 m)    GENERAL: The patient is a well-nourished female, in no acute distress. The vital signs are documented above. CARDIAC: There is a regular rate and rhythm.  VASCULAR: I do not detect carotid bruits. She has palpable dorsalis pedis pulses bilaterally. She has bilateral lower extremity swelling. She does not have any hyperpigmentation or significant varicose veins. PULMONARY: There is good air exchange bilaterally without wheezing or rales. ABDOMEN: Soft and non-tender with normal pitched bowel sounds.  MUSCULOSKELETAL: There are no major deformities or cyanosis. NEUROLOGIC: No focal weakness or paresthesias are detected. SKIN: There are no ulcers or rashes noted. PSYCHIATRIC: The patient has a normal affect.  DATA:    I reviewed  her previous venous duplex scan which showed some deep venous reflux involving the common femoral vein on the left and also reflux at the saphenofemoral junction.  There was no significant superficial venous reflux.  There is no evidence of DVT or superficial thrombophlebitis.  MEDICAL ISSUES:   VENOUS  INSUFFICIENCY: This patient does have some deep venous reflux and chronic venous insufficiency.  We have again discussed the importance of intermittent leg elevation the proper positioning for this.  I have encouraged her to consider wearing her compression stockings at work when she is standing.  She does not think these helped significantly however.  We discussed the importance of exercise especially walking.  I encouraged her to avoid prolonged sitting and standing.  Currently she does not have any significant superficial venous reflux and is therefore not a candidate for endovenous laser ablation of the saphenous vein.  Certainly if her symptoms progress in the future I be happy to see her back at any time.  I did reassure her that she has excellent arterial flow.  Deitra Mayo [facility] [phone]

## 2017-11-22 DIAGNOSIS — F331 Major depressive disorder, recurrent, moderate: Secondary | ICD-10-CM | POA: Diagnosis not present

## 2017-11-22 DIAGNOSIS — F900 Attention-deficit hyperactivity disorder, predominantly inattentive type: Secondary | ICD-10-CM | POA: Diagnosis not present

## 2017-11-22 DIAGNOSIS — F419 Anxiety disorder, unspecified: Secondary | ICD-10-CM | POA: Diagnosis not present

## 2017-11-27 MED FILL — OMEPRAZOLE 40 MG CPDR: 40 | 30 days supply | Qty: 30 | Fill #0

## 2017-12-05 DIAGNOSIS — F900 Attention-deficit hyperactivity disorder, predominantly inattentive type: Secondary | ICD-10-CM | POA: Diagnosis not present

## 2017-12-05 DIAGNOSIS — F331 Major depressive disorder, recurrent, moderate: Secondary | ICD-10-CM | POA: Diagnosis not present

## 2017-12-05 DIAGNOSIS — F419 Anxiety disorder, unspecified: Secondary | ICD-10-CM | POA: Diagnosis not present

## 2017-12-11 ENCOUNTER — Ambulatory Visit: Payer: 59 | Admitting: Podiatry

## 2017-12-16 ENCOUNTER — Encounter: Payer: Self-pay | Admitting: Nurse Practitioner

## 2017-12-16 DIAGNOSIS — G4719 Other hypersomnia: Secondary | ICD-10-CM

## 2017-12-16 DIAGNOSIS — F988 Other specified behavioral and emotional disorders with onset usually occurring in childhood and adolescence: Secondary | ICD-10-CM

## 2017-12-16 MED ORDER — DEXTROAMPHETAMINE SULFATE ER 15 MG PO CP24: 15.0000 mg | ORAL_CAPSULE | Freq: Every day | ORAL | 0 refills | Status: DC

## 2017-12-16 MED FILL — D-AMPHETAMINE ER 15 MG CAP: 15 | 30 days supply | Qty: 30 | Fill #0

## 2017-12-16 NOTE — Telephone Encounter (Signed)
Charlotte please advise, please help  PMP checked, last 3 pick up at [facility] was 07/03/2017 30 tab, 08/07/2017 for 30 tab and 09/26/2017 for 30 tab. Last refill sent in was 10/28/17 electronically.

## 2017-12-18 ENCOUNTER — Ambulatory Visit (INDEPENDENT_AMBULATORY_CARE_PROVIDER_SITE_OTHER): Payer: 59 | Admitting: Podiatry

## 2017-12-18 ENCOUNTER — Encounter: Payer: Self-pay | Admitting: Podiatry

## 2017-12-18 DIAGNOSIS — Z6841 Body Mass Index (BMI) 40.0 and over, adult: Secondary | ICD-10-CM | POA: Diagnosis not present

## 2017-12-18 DIAGNOSIS — M722 Plantar fascial fibromatosis: Secondary | ICD-10-CM | POA: Diagnosis not present

## 2017-12-18 DIAGNOSIS — Z01419 Encounter for gynecological examination (general) (routine) without abnormal findings: Secondary | ICD-10-CM | POA: Diagnosis not present

## 2017-12-18 NOTE — Patient Instructions (Signed)
Pre-Operative Instructions  Congratulations, you have decided to take an important step towards improving your quality of life.  You can be assured that the doctors and staff at [facility] will be with you every step of the way.  Here are some important things you should know:  1. Plan to be at the surgery center/hospital at least 1 (one) hour prior to your scheduled time, unless otherwise directed by the surgical center/hospital staff.  You must have a responsible adult accompany you, remain during the surgery and drive you home.  Make sure you have directions to the surgical center/hospital to ensure you arrive on time. 2. If you are having surgery at [facility] or [facility], you will need a copy of your medical history and physical form from your family physician within one month prior to the date of surgery. We will give you a form for your primary physician to complete.  3. We make every effort to accommodate the date you request for surgery.  However, there are times where surgery dates or times have to be moved.  We will contact you as soon as possible if a change in schedule is required.   4. No aspirin/ibuprofen for one week before surgery.  If you are on aspirin, any non-steroidal anti-inflammatory medications (Mobic, Aleve, Ibuprofen) should not be taken seven (7) days prior to your surgery.  You make take Tylenol for pain prior to surgery.  5. Medications - If you are taking daily heart and blood pressure medications, seizure, reflux, allergy, asthma, anxiety, pain or diabetes medications, make sure you notify the surgery center/hospital before the day of surgery so they can tell you which medications you should take or avoid the day of surgery. 6. No food or drink after midnight the night before surgery unless directed otherwise by surgical center/hospital staff. 7. No alcoholic beverages 24-hours prior to surgery.  No smoking 24-hours prior or 24-hours after  surgery. 8. Wear loose pants or shorts. They should be loose enough to fit over bandages, boots, and casts. 9. Don't wear slip-on shoes. Sneakers are preferred. 10. Bring your boot with you to the surgery center/hospital.  Also bring crutches or a walker if your physician has prescribed it for you.  If you do not have this equipment, it will be provided for you after surgery. 11. If you have not been contacted by the surgery center/hospital by the day before your surgery, call to confirm the date and time of your surgery. 12. Leave-time from work may vary depending on the type of surgery you have.  Appropriate arrangements should be made prior to surgery with your employer. 13. Prescriptions will be provided immediately following surgery by your doctor.  Fill these as soon as possible after surgery and take the medication as directed. Pain medications will not be refilled on weekends and must be approved by the doctor. 14. Remove nail polish on the operative foot and avoid getting pedicures prior to surgery. 15. Wash the night before surgery.  The night before surgery wash the foot and leg well with water and the antibacterial soap provided. Be sure to pay special attention to beneath the toenails and in between the toes.  Wash for at least three (3) minutes. Rinse thoroughly with water and dry well with a towel.  Perform this wash unless told not to do so by your physician.  Enclosed: 1 Ice pack (please put in freezer the night before surgery)   1 Hibiclens skin cleaner     Pre-op instructions  If you have any questions regarding the instructions, please do not hesitate to call our office.  [place]: [address], [place], [place] [address] -- [phone]  [place]: [address] [place], [place] [address] -- [phone]  [place]: [address]  [place], [place] [address] -- [phone]  [place]: [address], [address], [place], [place] [address] -- [phone]  Website:  [email or web address] 

## 2017-12-18 NOTE — Progress Notes (Signed)
Subjective:   Patient ID: Cheryl Ayala, female   DOB: 32 y.o.   MRN: [id number]   HPI Patient states that her heels are actually killing her left and this is been going on now for 15 years and she is exhausted by it.  Patient states that she only got a couple weeks of relief with the last injection   ROS      Objective:  Physical Exam  Neurovascular status intact with exquisite discomfort plantar aspect left heel at the insertional point tendon calcaneus with inflammation fluid around the medial band is very tender when palpated with moderate discomfort right     Assessment:  Chronic and acute plantar fasciitis left with moderate discomfort right that getting worse as time goes on and did not respond conservatively last straight     Plan:  H&P condition reviewed and due to the length of time she is getting this in the failure to respond conservatively surgical intervention has been recommended.  Patient wants surgery for this and I reviewed surgery allowing her to go over consent form going over alternative treatments complications associated with procedure.  She is willing to accept risk of surgery and after extensive review signed consent form understanding told recovery can take 6 months and she is dispensed air fracture walker with all instructions on usage and will start wearing today.  She is scheduled for surgery in the next several weeks and is encouraged to call with any questions concerns prior to procedure

## 2017-12-19 DIAGNOSIS — F900 Attention-deficit hyperactivity disorder, predominantly inattentive type: Secondary | ICD-10-CM | POA: Diagnosis not present

## 2017-12-19 DIAGNOSIS — F331 Major depressive disorder, recurrent, moderate: Secondary | ICD-10-CM | POA: Diagnosis not present

## 2017-12-19 DIAGNOSIS — F419 Anxiety disorder, unspecified: Secondary | ICD-10-CM | POA: Diagnosis not present

## 2017-12-20 LAB — HM PAP SMEAR: HM Pap smear: NEGATIVE

## 2017-12-27 ENCOUNTER — Ambulatory Visit (INDEPENDENT_AMBULATORY_CARE_PROVIDER_SITE_OTHER): Payer: 59 | Admitting: Gastroenterology

## 2017-12-27 ENCOUNTER — Encounter: Payer: Self-pay | Admitting: Gastroenterology

## 2017-12-27 VITALS — BP 136/98 | HR 84 | Ht 67.25 in | Wt 311.0 lb

## 2017-12-27 DIAGNOSIS — K219 Gastro-esophageal reflux disease without esophagitis: Secondary | ICD-10-CM

## 2017-12-27 DIAGNOSIS — R1319 Other dysphagia: Secondary | ICD-10-CM | POA: Diagnosis not present

## 2017-12-27 DIAGNOSIS — Z8371 Family history of colonic polyps: Secondary | ICD-10-CM

## 2017-12-27 DIAGNOSIS — Z83719 Family history of colon polyps, unspecified: Secondary | ICD-10-CM

## 2017-12-27 MED ORDER — OMEPRAZOLE 40 MG PO CPDR: 40.0000 mg | DELAYED_RELEASE_CAPSULE | Freq: Two times a day (BID) | ORAL | 3 refills | Status: DC

## 2017-12-27 MED FILL — OMEPRAZOLE 40 MG CPDR: 40 | 90 days supply | Qty: 180 | Fill #0

## 2017-12-27 NOTE — Patient Instructions (Signed)
You have been scheduled for an endoscopy and colonoscopy. Please follow the written instructions given to you at your visit today. Please pick up your prep supplies at the pharmacy within the next 1-3 days. If you use inhalers (even only as needed), please bring them with you on the day of your procedure.  Take omeprazole 40 mg twice a day before breakfast and dinner  Use Pepcid 20 mg at bedtime     Gastroesophageal Reflux Disease, Adult Normally, food travels down the esophagus and stays in the stomach to be digested. However, when a person has gastroesophageal reflux disease (GERD), food and stomach acid move back up into the esophagus. When this happens, the esophagus becomes sore and inflamed. Over time, GERD can create small holes (ulcers) in the lining of the esophagus. What are the causes? This condition is caused by a problem with the muscle between the esophagus and the stomach (lower esophageal sphincter, or LES). Normally, the LES muscle closes after food passes through the esophagus to the stomach. When the LES is weakened or abnormal, it does not close properly, and that allows food and stomach acid to go back up into the esophagus. The LES can be weakened by certain dietary substances, medicines, and medical conditions, including:  Tobacco use.  Pregnancy.  Having a hiatal hernia.  Heavy alcohol use.  Certain foods and beverages, such as coffee, chocolate, onions, and peppermint.  What increases the risk? This condition is more likely to develop in:  People who have an increased body weight.  People who have connective tissue disorders.  People who use NSAID medicines.  What are the signs or symptoms? Symptoms of this condition include:  Heartburn.  Difficult or painful swallowing.  The feeling of having a lump in the throat.  Abitter taste in the mouth.  Bad breath.  Having a large amount of saliva.  Having an upset or bloated  stomach.  Belching.  Chest pain.  Shortness of breath or wheezing.  Ongoing (chronic) cough or a night-time cough.  Wearing away of tooth enamel.  Weight loss.  Different conditions can cause chest pain. Make sure to see your health care provider if you experience chest pain. How is this diagnosed? Your health care provider will take a medical history and perform a physical exam. To determine if you have mild or severe GERD, your health care provider may also monitor how you respond to treatment. You may also have other tests, including:  An endoscopy toexamine your stomach and esophagus with a small camera.  A test thatmeasures the acidity level in your esophagus.  A test thatmeasures how much pressure is on your esophagus.  A barium swallow or modified barium swallow to show the shape, size, and functioning of your esophagus.  How is this treated? The goal of treatment is to help relieve your symptoms and to prevent complications. Treatment for this condition may vary depending on how severe your symptoms are. Your health care provider may recommend:  Changes to your diet.  Medicine.  Surgery.  Follow these instructions at home: Diet  Follow a diet as recommended by your health care provider. This may involve avoiding foods and drinks such as: ? Coffee and tea (with or without caffeine). ? Drinks that containalcohol. ? Energy drinks and sports drinks. ? Carbonated drinks or sodas. ? Chocolate and cocoa. ? Peppermint and mint flavorings. ? Garlic and onions. ? Horseradish. ? Spicy and acidic foods, including peppers, chili powder, curry powder, vinegar, hot sauces,  and barbecue sauce. ? Citrus fruit juices and citrus fruits, such as oranges, lemons, and limes. ? Tomato-based foods, such as red sauce, chili, salsa, and pizza with red sauce. ? Fried and fatty foods, such as donuts, french fries, potato chips, and high-fat dressings. ? High-fat meats, such as hot  dogs and fatty cuts of red and white meats, such as rib eye steak, sausage, ham, and bacon. ? High-fat dairy items, such as whole milk, butter, and cream cheese.  Eat small, frequent meals instead of large meals.  Avoid drinking large amounts of liquid with your meals.  Avoid eating meals during the 2-3 hours before bedtime.  Avoid lying down right after you eat.  Do not exercise right after you eat. General instructions  Pay attention to any changes in your symptoms.  Take over-the-counter and prescription medicines only as told by your health care provider. Do not take aspirin, ibuprofen, or other NSAIDs unless your health care provider told you to do so.  Do not use any tobacco products, including cigarettes, chewing tobacco, and e-cigarettes. If you need help quitting, ask your health care provider.  Wear loose-fitting clothing. Do not wear anything tight around your waist that causes pressure on your abdomen.  Raise (elevate) the head of your bed 6 inches (15cm).  Try to reduce your stress, such as with yoga or meditation. If you need help reducing stress, ask your health care provider.  If you are overweight, reduce your weight to an amount that is healthy for you. Ask your health care provider for guidance about a safe weight loss goal.  Keep all follow-up visits as told by your health care provider. This is important. Contact a health care provider if:  You have new symptoms.  You have unexplained weight loss.  You have difficulty swallowing, or it hurts to swallow.  You have wheezing or a persistent cough.  Your symptoms do not improve with treatment.  You have a hoarse voice. Get help right away if:  You have pain in your arms, neck, jaw, teeth, or back.  You feel sweaty, dizzy, or light-headed.  You have chest pain or shortness of breath.  You vomit and your vomit looks like blood or coffee grounds.  You faint.  Your stool is bloody or black.  You  cannot swallow, drink, or eat. This information is not intended to replace advice given to you by your health care provider. Make sure you discuss any questions you have with your health care provider. Document Released: 10/11/2004 Document Revised: 06/01/2015 Document Reviewed: 04/28/2014 Elsevier Interactive Patient Education  [employer].

## 2017-12-27 NOTE — Progress Notes (Signed)
Cheryl Ayala    [id number]    Apr 09, 1985  Primary Care Physician:Nche, Charlene Brooke, NP  Referring Physician: Flossie Buffy, NP [place], [place] [address]  Chief complaint: GERD, dysphagia  HPI: 32 year old female with obesity, chronic GERD and intermittent dysphagia here for follow-up visit Family history of advanced adenomatous polyps, mother at age 69 and also in her sister in her early 48s.  She was scheduled for colonoscopy, cancelled by patient due to conflict with her schedule.  Esophaeal manometry: 09/25/2017: Ineffective esophageal motility with poor bolus clearance Esophagogram :Non specific esophageal motility She is currently taking omeprazole once daily but continues to have breakthrough heartburn few times a week especially worse when she eats greasy food.  She continues to have intermittent difficulty swallowing.  Denies any blood per rectum or dark stool.  No nausea, vomiting or abdominal pain.   Outpatient Encounter Medications as of 12/27/2017  Medication Sig  . amLODipine (NORVASC) 5 MG tablet Take 1 tablet (5 mg total) by mouth daily.  Marland Kitchen dextroamphetamine (DEXEDRINE SPANSULE) 15 MG 24 hr capsule Take 1 capsule (15 mg total) by mouth daily.  Marland Kitchen labetalol (NORMODYNE) 200 MG tablet Take 1 tablet (200 mg total) by mouth 2 (two) times daily.  Marland Kitchen omeprazole (PRILOSEC) 40 MG capsule Take 30 minutes before the first meal of the day  . sertraline (ZOLOFT) 100 MG tablet Take 1 tablet (100 mg total) by mouth daily.  . [DISCONTINUED] CLOMIPHENE CITRATE PO Take by mouth.  . [DISCONTINUED] colestipol (COLESTID) 1 g tablet Take 1 tablet (1 g total) by mouth 2 (two) times daily.  . [DISCONTINUED] dexlansoprazole (DEXILANT) 60 MG capsule Take 1 capsule (60 mg total) by mouth daily.  . [DISCONTINUED] human chorionic gonadotropin (PREGNYL/NOVAREL) 10000 units injection Inject 10,000 units when instructed by nurse or physician.  .  [DISCONTINUED] Na Sulfate-K Sulfate-Mg Sulf 17.5-3.13-1.6 GM/177ML SOLN 1 kit  . [DISCONTINUED] ranitidine (ZANTAC) 150 MG tablet Take 1 tablet (150 mg total) by mouth at bedtime.   No facility-administered encounter medications on file as of 12/27/2017.     Allergies as of 12/27/2017 - Review Complete 12/27/2017  Allergen Reaction Noted  . Penicillins Rash 12/12/2016  . Sulfa antibiotics Rash 12/12/2016    Past Medical History:  Diagnosis Date  . Anxiety   . Hypertension   . Plantar fasciitis   . Venous insufficiency     Past Surgical History:  Procedure Laterality Date  . ESOPHAGEAL MANOMETRY N/A 09/25/2017   Procedure: ESOPHAGEAL MANOMETRY (EM);  Surgeon: Mauri Pole, MD;  Location: [facility];  Service: Endoscopy;  Laterality: N/A;  . WISDOM TOOTH EXTRACTION  2013    Family History  Problem Relation Age of Onset  . Cancer Mother   . Hypertension Father   . Hyperlipidemia Father   . Anxiety disorder Father   . Depression Father   . Heart disease Father   . AAA (abdominal aortic aneurysm) Father   . Depression Sister   . Anxiety disorder Sister   . Heart disease Maternal Grandmother   . Alzheimer's disease Maternal Grandmother   . Stroke Maternal Grandmother   . Gout Maternal Grandfather   . Heart disease Paternal Grandfather     Social History   Socioeconomic History  . Marital status: Married    Spouse name: Not on file  . Number of children: Not on file  . Years of education: Not on file  . Highest education  level: Not on file  Occupational History  . Not on file  Social Needs  . Financial resource strain: Not on file  . Food insecurity:    Worry: Not on file    Inability: Not on file  . Transportation needs:    Medical: Not on file    Non-medical: Not on file  Tobacco Use  . Smoking status: Never Smoker  . Smokeless tobacco: Never Used  Substance and Sexual Activity  . Alcohol use: Yes    Frequency: Never    Comment: once every 6  months   . Drug use: Never  . Sexual activity: Not on file  Lifestyle  . Physical activity:    Days per week: Not on file    Minutes per session: Not on file  . Stress: Not on file  Relationships  . Social connections:    Talks on phone: Not on file    Gets together: Not on file    Attends religious service: Not on file    Active member of club or organization: Not on file    Attends meetings of clubs or organizations: Not on file    Relationship status: Not on file  . Intimate partner violence:    Fear of current or ex partner: Not on file    Emotionally abused: Not on file    Physically abused: Not on file    Forced sexual activity: Not on file  Other Topics Concern  . Not on file  Social History Narrative  . Not on file      Review of systems: Review of Systems  Constitutional: Negative for fever and chills.  HENT: Positive for sinus problem and postnasal drip Eyes: Negative for blurred vision.  Respiratory: Negative for cough, shortness of breath and wheezing.   Cardiovascular: Negative for chest pain and palpitations.  Gastrointestinal: as per HPI Genitourinary: Negative for dysuria, urgency, frequency and hematuria.  Musculoskeletal: Negative for myalgias, back pain and joint pain.  Skin: Negative for itching and rash.  Neurological: Negative for dizziness, tremors, focal weakness, seizures and loss of consciousness.  Endo/Heme/Allergies: Positive for seasonal allergies.  Psychiatric/Behavioral: Negative for depression, suicidal ideas and hallucinations.  All other systems reviewed and are negative.   Physical Exam: Vitals:   12/27/17 1007  BP: (!) 136/98  Pulse: 84   Body mass index is 48.35 kg/m. Gen:      No acute distress HEENT:  EOMI, sclera anicteric Neck:     No masses; no thyromegaly Lungs:    Clear to auscultation bilaterally; normal respiratory effort CV:         Regular rate and rhythm; no murmurs Abd:      + bowel sounds; soft, non-tender;  no palpable masses, no distension Ext:    No edema; adequate peripheral perfusion Skin:      Warm and dry; no rash Neuro: alert and oriented x 3 Psych: normal mood and affect  Data Reviewed:  Reviewed labs, radiology imaging, old records and pertinent past GI work up   Assessment and Plan/Recommendations:  32 year old female with history of morbid obesity, chronic GERD with intermittent dysphagia Esophageal manometry showed ineffective esophageal motility with poor bolus clearance Schedule for EGD for esophageal biopsies to exclude EOE and also severe esophagitis secondary to acid reflux Increase omeprazole twice daily, 30 minutes before breakfast and dinner Pepcid 20 mg at bedtime as needed Discussed antireflux measures and lifestyle changes  Family history of advanced adenomatous polyps in her mother and sister in  their 30's We will schedule for colonoscopy for colorectal cancer screening based on family history The risks and benefits as well as alternatives of endoscopic procedure(s) have been discussed and reviewed. All questions answered. The patient agrees to proceed.     Damaris Hippo , MD [phone]    CC: Nche, Charlene Brooke, NP

## 2017-12-29 ENCOUNTER — Encounter: Payer: Self-pay | Admitting: Gastroenterology

## 2017-12-31 ENCOUNTER — Telehealth: Payer: Self-pay | Admitting: Allergy & Immunology

## 2017-12-31 MED ORDER — DOXYCYCLINE HYCLATE 100 MG PO CAPS: 100.0000 mg | ORAL_CAPSULE | Freq: Two times a day (BID) | ORAL | 0 refills | Status: AC

## 2017-12-31 MED FILL — DOXYCYCLINE HYC 100 MG CAPS: 100 | 10 days supply | Qty: 20 | Fill #0

## 2017-12-31 NOTE — Telephone Encounter (Signed)
I received a call from [facility] reporting 10+ days of worsening cough and congestion. She has tried OTC medications without relief. Therefore I will send in doxycyline 100mg  BID for ten days. Patient in agreement with the plan.   Salvatore Marvel, MD [facility]

## 2018-01-02 DIAGNOSIS — F331 Major depressive disorder, recurrent, moderate: Secondary | ICD-10-CM | POA: Diagnosis not present

## 2018-01-02 DIAGNOSIS — F900 Attention-deficit hyperactivity disorder, predominantly inattentive type: Secondary | ICD-10-CM | POA: Diagnosis not present

## 2018-01-02 DIAGNOSIS — F419 Anxiety disorder, unspecified: Secondary | ICD-10-CM | POA: Diagnosis not present

## 2018-01-07 DIAGNOSIS — M722 Plantar fascial fibromatosis: Secondary | ICD-10-CM | POA: Diagnosis not present

## 2018-01-07 DIAGNOSIS — I10 Essential (primary) hypertension: Secondary | ICD-10-CM | POA: Diagnosis not present

## 2018-01-07 MED FILL — HYDROCODON-APAP 10-325: 10-325 | 3 days supply | Qty: 20 | Fill #0

## 2018-01-13 ENCOUNTER — Ambulatory Visit (INDEPENDENT_AMBULATORY_CARE_PROVIDER_SITE_OTHER): Payer: Self-pay | Admitting: Podiatry

## 2018-01-13 DIAGNOSIS — M722 Plantar fascial fibromatosis: Secondary | ICD-10-CM

## 2018-01-13 DIAGNOSIS — Z9889 Other specified postprocedural states: Secondary | ICD-10-CM

## 2018-01-16 ENCOUNTER — Encounter: Payer: Self-pay | Admitting: Podiatry

## 2018-01-16 NOTE — Progress Notes (Signed)
   Subjective:  Patient presents today status post EPF left. DOS: 01/07/18. She states she is doing well overall. She reports moderate itching of the foot. She denies any significant pain or modifying factors. She has been using the CAM boot as directed. Patient is here for further evaluation and treatment.    Past Medical History:  Diagnosis Date  . Anxiety   . Hypertension   . Plantar fasciitis   . Venous insufficiency       Objective/Physical Exam Neurovascular status intact.  Skin incisions appear to be well coapted with sutures and staples intact. No sign of infectious process noted. No dehiscence. No active bleeding noted. Moderate edema noted to the surgical extremity.  Assessment: 1. s/p EPF left. DOS: 01/07/18   Plan of Care:  1. Patient was evaluated. 2. Recommended antibiotic ointment daily with a bandage.  3. Continue weightbearing in CAM boot.  4. Return to clinic in one week for suture removal.    Edrick Kins, DPM [facility]  Dr. Edrick Kins, [address]                                        [place], [place] [address]                Office [phone]  Fax [phone]

## 2018-01-22 MED FILL — AMLODIPINE BESYLATE 5 MG TA: 5 | 90 days supply | Qty: 90 | Fill #0

## 2018-01-27 ENCOUNTER — Encounter: Payer: Self-pay | Admitting: Podiatry

## 2018-01-27 ENCOUNTER — Ambulatory Visit (INDEPENDENT_AMBULATORY_CARE_PROVIDER_SITE_OTHER): Payer: No Typology Code available for payment source | Admitting: Podiatry

## 2018-01-27 DIAGNOSIS — M722 Plantar fascial fibromatosis: Secondary | ICD-10-CM

## 2018-01-27 DIAGNOSIS — Z9889 Other specified postprocedural states: Secondary | ICD-10-CM

## 2018-01-27 MED ORDER — TRIAMCINOLONE ACETONIDE 10 MG/ML IJ SUSP
10.0000 mg | Freq: Once | INTRAMUSCULAR | Status: AC
  Administered 2018-01-27: 10 mg

## 2018-01-28 ENCOUNTER — Telehealth: Payer: Self-pay | Admitting: Allergy & Immunology

## 2018-01-28 NOTE — Telephone Encounter (Signed)
Patient developed vomiting at work. She declined Zofran but I will send it in if needed. We are sending her home from work.  Salvatore Marvel, MD [facility]

## 2018-01-29 NOTE — Progress Notes (Signed)
Subjective:   Patient ID: Cheryl Ayala, female   DOB: 33 y.o.   MRN: [id number]   HPI Patient states overall her foot is feeling pretty good but the incision sites are mildly painful and she does get some discomfort in the bottom of the arch   ROS      Objective:  Physical Exam  Neurovascular status intact with incision sites well coapted medial lateral side of the heel left with stitches intact     Assessment:  Doing well post endoscopic surgery left heel     Plan:  H&P condition reviewed and stitches are removed bilateral.  Wound edges coapted well instructed on range of motion exercises and ice therapy and dispensed ankle compression stocking.  Reappoint to recheck again in the future

## 2018-02-03 ENCOUNTER — Encounter: Payer: Self-pay | Admitting: Nurse Practitioner

## 2018-02-03 ENCOUNTER — Encounter: Payer: Self-pay | Admitting: Gastroenterology

## 2018-02-03 ENCOUNTER — Telehealth: Payer: Self-pay | Admitting: Nurse Practitioner

## 2018-02-03 DIAGNOSIS — F988 Other specified behavioral and emotional disorders with onset usually occurring in childhood and adolescence: Secondary | ICD-10-CM

## 2018-02-03 DIAGNOSIS — G4719 Other hypersomnia: Secondary | ICD-10-CM

## 2018-02-03 MED ORDER — DEXTROAMPHETAMINE SULFATE ER 15 MG PO CP24: 15.0000 mg | ORAL_CAPSULE | Freq: Every day | ORAL | 0 refills | Status: DC

## 2018-02-03 MED FILL — LABETALOL HCL 200 MG TABS: 200 | 90 days supply | Qty: 180 | Fill #1

## 2018-02-03 MED FILL — D-AMPHETAMINE ER 15 MG CAP: 15 | 30 days supply | Qty: 30 | Fill #0

## 2018-02-03 MED FILL — SUPREP BOWEL PREP KIT: 17.5-3.13-1 | 1 days supply | Qty: 354 | Fill #0

## 2018-02-03 NOTE — Telephone Encounter (Signed)
Will refill now as PMP appropriate and pt with f/u appt scheduled with PCP

## 2018-02-03 NOTE — Telephone Encounter (Signed)
Pt aware.

## 2018-02-03 NOTE — Telephone Encounter (Signed)
Dr. Bryan Lemma please advise. Pt is out of Dexedrine. Last office with [place] was 07/2017 and schedule a follow up with Nche 02/07/2018 (6 mo f/u). PMP checked last 3 pick up was 09/26/17,10/29/2017 and 12/16/2017 for 30 tab at [facility].

## 2018-02-07 ENCOUNTER — Encounter: Payer: Self-pay | Admitting: Nurse Practitioner

## 2018-02-07 ENCOUNTER — Ambulatory Visit (INDEPENDENT_AMBULATORY_CARE_PROVIDER_SITE_OTHER): Payer: No Typology Code available for payment source | Admitting: Nurse Practitioner

## 2018-02-07 VITALS — BP 132/100 | HR 74 | Temp 98.7°F | Ht 67.5 in | Wt 308.0 lb

## 2018-02-07 DIAGNOSIS — Z0001 Encounter for general adult medical examination with abnormal findings: Secondary | ICD-10-CM | POA: Diagnosis not present

## 2018-02-07 DIAGNOSIS — K2100 Gastro-esophageal reflux disease with esophagitis, without bleeding: Secondary | ICD-10-CM | POA: Insufficient documentation

## 2018-02-07 DIAGNOSIS — Z13220 Encounter for screening for lipoid disorders: Secondary | ICD-10-CM

## 2018-02-07 DIAGNOSIS — M722 Plantar fascial fibromatosis: Secondary | ICD-10-CM

## 2018-02-07 DIAGNOSIS — F325 Major depressive disorder, single episode, in full remission: Secondary | ICD-10-CM

## 2018-02-07 DIAGNOSIS — Z79899 Other long term (current) drug therapy: Secondary | ICD-10-CM

## 2018-02-07 DIAGNOSIS — I10 Essential (primary) hypertension: Secondary | ICD-10-CM | POA: Diagnosis not present

## 2018-02-07 DIAGNOSIS — R739 Hyperglycemia, unspecified: Secondary | ICD-10-CM

## 2018-02-07 DIAGNOSIS — R1310 Dysphagia, unspecified: Secondary | ICD-10-CM

## 2018-02-07 DIAGNOSIS — F909 Attention-deficit hyperactivity disorder, unspecified type: Secondary | ICD-10-CM | POA: Diagnosis not present

## 2018-02-07 DIAGNOSIS — Z136 Encounter for screening for cardiovascular disorders: Secondary | ICD-10-CM | POA: Diagnosis not present

## 2018-02-07 DIAGNOSIS — R1319 Other dysphagia: Secondary | ICD-10-CM

## 2018-02-07 DIAGNOSIS — F411 Generalized anxiety disorder: Secondary | ICD-10-CM

## 2018-02-07 LAB — COMPREHENSIVE METABOLIC PANEL
ALT: 14 U/L (ref 0–35)
AST: 15 U/L (ref 0–37)
Albumin: 4 g/dL (ref 3.5–5.2)
Alkaline Phosphatase: 52 U/L (ref 39–117)
BUN: 16 mg/dL (ref 6–23)
CO2: 27 meq/L (ref 19–32)
Calcium: 8.8 mg/dL (ref 8.4–10.5)
Chloride: 104 mEq/L (ref 96–112)
Creatinine, Ser: 0.85 mg/dL (ref 0.40–1.20)
GFR: 76.73 mL/min (ref >60.00)
Glucose, Bld: 110 mg/dL — ABNORMAL HIGH (ref 70–99)
Potassium: 3.7 mEq/L (ref 3.5–5.1)
Sodium: 140 mEq/L (ref 135–145)
Total Bilirubin: 0.4 mg/dL (ref 0.2–1.2)
Total Protein: 6.7 g/dL (ref 6.0–8.3)

## 2018-02-07 LAB — CBC
HCT: 36.9 % (ref 36.0–46.0)
HEMOGLOBIN: 12.2 g/dL (ref 12.0–15.0)
MCHC: 33 g/dL (ref 30.0–36.0)
MCV: 78.9 fl (ref 78.0–100.0)
PLATELETS: 436 10*3/uL — AB (ref 150.0–400.0)
RBC: 4.68 Mil/uL (ref 3.87–5.11)
RDW: 14.1 % (ref 11.5–15.5)
WBC: 9.7 10*3/uL (ref 4.0–10.5)

## 2018-02-07 LAB — LIPID PANEL
Cholesterol: 203 mg/dL — ABNORMAL HIGH (ref 0–200)
HDL: 52.3 mg/dL (ref >39.00)
LDL Cholesterol: 140 mg/dL — ABNORMAL HIGH (ref 0–99)
NonHDL: 151.03
Total CHOL/HDL Ratio: 4
Triglycerides: 56 mg/dL (ref 0.0–149.0)
VLDL: 11.2 mg/dL (ref 0.0–40.0)

## 2018-02-07 LAB — HEMOGLOBIN A1C: Hgb A1c MFr Bld: 6.1 % (ref 4.6–6.5)

## 2018-02-07 LAB — TSH: TSH: 2.63 u[IU]/mL (ref 0.35–4.50)

## 2018-02-07 NOTE — Patient Instructions (Addendum)
Normal CMP except elevated glucose due to prediabetes with hgbA1c of 6.1 Abnormal lipid panel with elevated total cholesterol and LDL. Need to maintain heart healthy diet. Once foot heals, need to maintain regular exercise regimen. Normal CBC and TSH  Health Maintenance, Female Adopting a healthy lifestyle and getting preventive care can go a long way to promote health and wellness. Talk with your health care provider about what schedule of regular examinations is right for you. This is a good chance for you to check in with your provider about disease prevention and staying healthy. In between checkups, there are plenty of things you can do on your own. Experts have done a lot of research about which lifestyle changes and preventive measures are most likely to keep you healthy. Ask your health care provider for more information. Weight and diet Eat a healthy diet  Be sure to include plenty of vegetables, fruits, low-fat dairy products, and lean protein.  Do not eat a lot of foods high in solid fats, added sugars, or salt.  Get regular exercise. This is one of the most important things you can do for your health. ? Most adults should exercise for at least 150 minutes each week. The exercise should increase your heart rate and make you sweat (moderate-intensity exercise). ? Most adults should also do strengthening exercises at least twice a week. This is in addition to the moderate-intensity exercise. Maintain a healthy weight  Body mass index (BMI) is a measurement that can be used to identify possible weight problems. It estimates body fat based on height and weight. Your health care provider can help determine your BMI and help you achieve or maintain a healthy weight.  For females 34 years of age and older: ? A BMI below 18.5 is considered underweight. ? A BMI of 18.5 to 24.9 is normal. ? A BMI of 25 to 29.9 is considered overweight. ? A BMI of 30 and above is considered obese. Watch  levels of cholesterol and blood lipids  You should start having your blood tested for lipids and cholesterol at 34 years of age, then have this test every 5 years.  You may need to have your cholesterol levels checked more often if: ? Your lipid or cholesterol levels are high. ? You are older than 34 years of age. ? You are at high risk for heart disease. Cancer screening Lung Cancer  Lung cancer screening is recommended for adults 73-45 years old who are at high risk for lung cancer because of a history of smoking.  A yearly low-dose CT scan of the lungs is recommended for people who: ? Currently smoke. ? Have quit within the past 15 years. ? Have at least a 30-pack-year history of smoking. A pack year is smoking an average of one pack of cigarettes a day for 1 year.  Yearly screening should continue until it has been 15 years since you quit.  Yearly screening should stop if you develop a health problem that would prevent you from having lung cancer treatment. Breast Cancer  Practice breast self-awareness. This means understanding how your breasts normally appear and feel.  It also means doing regular breast self-exams. Let your health care provider know about any changes, no matter how small.  If you are in your 20s or 30s, you should have a clinical breast exam (CBE) by a health care provider every 1-3 years as part of a regular health exam.  If you are 42 or older, have a CBE  every year. Also consider having a breast X-ray (mammogram) every year.  If you have a family history of breast cancer, talk to your health care provider about genetic screening.  If you are at high risk for breast cancer, talk to your health care provider about having an MRI and a mammogram every year.  Breast cancer gene (BRCA) assessment is recommended for women who have family members with BRCA-related cancers. BRCA-related cancers include: ? Breast. ? Ovarian. ? Tubal. ? Peritoneal  cancers.  Results of the assessment will determine the need for genetic counseling and BRCA1 and BRCA2 testing. Cervical Cancer Your health care provider may recommend that you be screened regularly for cancer of the pelvic organs (ovaries, uterus, and vagina). This screening involves a pelvic examination, including checking for microscopic changes to the surface of your cervix (Pap test). You may be encouraged to have this screening done every 3 years, beginning at age 53.  For women ages 58-65, health care providers may recommend pelvic exams and Pap testing every 3 years, or they may recommend the Pap and pelvic exam, combined with testing for human papilloma virus (HPV), every 5 years. Some types of HPV increase your risk of cervical cancer. Testing for HPV may also be done on women of any age with unclear Pap test results.  Other health care providers may not recommend any screening for nonpregnant women who are considered low risk for pelvic cancer and who do not have symptoms. Ask your health care provider if a screening pelvic exam is right for you.  If you have had past treatment for cervical cancer or a condition that could lead to cancer, you need Pap tests and screening for cancer for at least 20 years after your treatment. If Pap tests have been discontinued, your risk factors (such as having a new sexual partner) need to be reassessed to determine if screening should resume. Some women have medical problems that increase the chance of getting cervical cancer. In these cases, your health care provider may recommend more frequent screening and Pap tests. Colorectal Cancer  This type of cancer can be detected and often prevented.  Routine colorectal cancer screening usually begins at 34 years of age and continues through 34 years of age.  Your health care provider may recommend screening at an earlier age if you have risk factors for colon cancer.  Your health care provider may also  recommend using home test kits to check for hidden blood in the stool.  A small camera at the end of a tube can be used to examine your colon directly (sigmoidoscopy or colonoscopy). This is done to check for the earliest forms of colorectal cancer.  Routine screening usually begins at age 40.  Direct examination of the colon should be repeated every 5-10 years through 34 years of age. However, you may need to be screened more often if early forms of precancerous polyps or small growths are found. Skin Cancer  Check your skin from head to toe regularly.  Tell your health care provider about any new moles or changes in moles, especially if there is a change in a mole's shape or color.  Also tell your health care provider if you have a mole that is larger than the size of a pencil eraser.  Always use sunscreen. Apply sunscreen liberally and repeatedly throughout the day.  Protect yourself by wearing long sleeves, pants, a wide-brimmed hat, and sunglasses whenever you are outside. Heart disease, diabetes, and high blood pressure  High blood pressure causes heart disease and increases the risk of stroke. High blood pressure is more likely to develop in: ? People who have blood pressure in the high end of the normal range (130-139/85-89 mm Hg). ? People who are overweight or obese. ? People who are African American.  If you are 62-11 years of age, have your blood pressure checked every 3-5 years. If you are 45 years of age or older, have your blood pressure checked every year. You should have your blood pressure measured twice-once when you are at a hospital or clinic, and once when you are not at a hospital or clinic. Record the average of the two measurements. To check your blood pressure when you are not at a hospital or clinic, you can use: ? An automated blood pressure machine at a pharmacy. ? A home blood pressure monitor.  If you are between 46 years and 30 years old, ask your health  care provider if you should take aspirin to prevent strokes.  Have regular diabetes screenings. This involves taking a blood sample to check your fasting blood sugar level. ? If you are at a normal weight and have a low risk for diabetes, have this test once every three years after 34 years of age. ? If you are overweight and have a high risk for diabetes, consider being tested at a younger age or more often. Preventing infection Hepatitis B  If you have a higher risk for hepatitis B, you should be screened for this virus. You are considered at high risk for hepatitis B if: ? You were born in a country where hepatitis B is common. Ask your health care provider which countries are considered high risk. ? Your parents were born in a high-risk country, and you have not been immunized against hepatitis B (hepatitis B vaccine). ? You have HIV or AIDS. ? You use needles to inject street drugs. ? You live with someone who has hepatitis B. ? You have had sex with someone who has hepatitis B. ? You get hemodialysis treatment. ? You take certain medicines for conditions, including cancer, organ transplantation, and autoimmune conditions. Hepatitis C  Blood testing is recommended for: ? Everyone born from [birth year] through 1966. ? Anyone with known risk factors for hepatitis C. Sexually transmitted infections (STIs)  You should be screened for sexually transmitted infections (STIs) including gonorrhea and chlamydia if: ? You are sexually active and are younger than 34 years of age. ? You are older than 34 years of age and your health care provider tells you that you are at risk for this type of infection. ? Your sexual activity has changed since you were last screened and you are at an increased risk for chlamydia or gonorrhea. Ask your health care provider if you are at risk.  If you do not have HIV, but are at risk, it may be recommended that you take a prescription medicine daily to prevent HIV  infection. This is called pre-exposure prophylaxis (PrEP). You are considered at risk if: ? You are sexually active and do not regularly use condoms or know the HIV status of your partner(s). ? You take drugs by injection. ? You are sexually active with a partner who has HIV. Talk with your health care provider about whether you are at high risk of being infected with HIV. If you choose to begin PrEP, you should first be tested for HIV. You should then be tested every 3 months for as long  as you are taking PrEP. Pregnancy  If you are premenopausal and you may become pregnant, ask your health care provider about preconception counseling.  If you may become pregnant, take 400 to 800 micrograms (mcg) of folic acid every day.  If you want to prevent pregnancy, talk to your health care provider about birth control (contraception). Osteoporosis and menopause  Osteoporosis is a disease in which the bones lose minerals and strength with aging. This can result in serious bone fractures. Your risk for osteoporosis can be identified using a bone density scan.  If you are 43 years of age or older, or if you are at risk for osteoporosis and fractures, ask your health care provider if you should be screened.  Ask your health care provider whether you should take a calcium or vitamin D supplement to lower your risk for osteoporosis.  Menopause may have certain physical symptoms and risks.  Hormone replacement therapy may reduce some of these symptoms and risks. Talk to your health care provider about whether hormone replacement therapy is right for you. Follow these instructions at home:  Schedule regular health, dental, and eye exams.  Stay current with your immunizations.  Do not use any tobacco products including cigarettes, chewing tobacco, or electronic cigarettes.  If you are pregnant, do not drink alcohol.  If you are breastfeeding, limit how much and how often you drink alcohol.  Limit  alcohol intake to no more than 1 drink per day for nonpregnant women. One drink equals 12 ounces of beer, 5 ounces of wine, or 1 ounces of hard liquor.  Do not use street drugs.  Do not share needles.  Ask your health care provider for help if you need support or information about quitting drugs.  Tell your health care provider if you often feel depressed.  Tell your health care provider if you have ever been abused or do not feel safe at home. This information is not intended to replace advice given to you by your health care provider. Make sure you discuss any questions you have with your health care provider. Document Released: 07/17/2010 Document Revised: 06/09/2015 Document Reviewed: 10/05/2014 Elsevier Interactive Patient Education  2019 [employer].

## 2018-02-07 NOTE — Progress Notes (Addendum)
Subjective:    Patient ID: Cheryl Ayala, female    DOB: [birth year], 33 y.o.   MRN: [id number]  Patient presents today for complete physical and f/up on chronic conditions.  HPI  HTN: Elevated diastolic. Current use of amlodipine and labetalol BP Readings from Last 3 Encounters:  02/10/18 115/68  02/07/18 (!) 132/100  12/27/17 (!) 136/98   Sexual History (orientation,birth control, marital status, STD):married, sexually active, no contraception Upcoming appt with GYN, last PAP done last year (normal per patient)  Depression/Suicide: stable with use of zoloft. Depression screen [facility] 2/9 02/07/2018 08/07/2017  Decreased Interest 0 0  Down, Depressed, Hopeless 0 0  PHQ - 2 Score 0 0   ADHD and Hypersomnia: Stable mood with dexedrine No insomnia, no irritability, no change in appetite  Needs referral to GI and podiatry due to new insurance plan.  Vision:up to date, done annually  Dental:up to date, done every 76months  Immunizations: (TDAP, Hep C screen, Pneumovax, Influenza, zoster)  Health Maintenance  Topic Date Due  . Pap Smear  06/18/2005  . Tetanus Vaccine  10/15/2017  . HIV Screening  02/08/2019*  . Flu Shot  Completed  *Topic was postponed. The date shown is not the original due date.   Diet:regular.  Weight:  Wt Readings from Last 3 Encounters:  02/10/18 (!) 311 lb (141.1 kg)  02/07/18 (!) 308 lb (139.7 kg)  12/27/17 (!) 311 lb (141.1 kg)    Exercise:none due to recent foot surgery  Fall Risk: Fall Risk  02/07/2018 08/07/2017  Falls in the past year? 0 No   Advanced Directive: Advanced Directives 11/21/2015  Does Patient Have a Medical Advance Directive? No  Would patient like information on creating a medical advance directive? No - patient declined information     Medications and allergies reviewed with patient and updated if appropriate.  Patient Active Problem List   Diagnosis Date Noted  . Hyperglycemia 02/09/2018  . Plantar fasciitis of  left foot 02/07/2018  . Plantar fasciitis, bilateral 02/07/2018  . GERD with esophagitis 02/07/2018  . Esophageal dysphagia   . Chronic venous insufficiency 07/03/2017  . Varicose veins of bilateral lower extremities with other complications 97/98/9211  . Controlled substance agreement signed 06/07/2017  . Bilateral leg edema 06/07/2017  . Depression 12/23/2015  . Hypersomnia 12/23/2015  . ADD (attention deficit disorder) 10/06/2015  . Excessive daytime sleepiness 10/06/2015  . Adult ADHD 09/02/2015  . Anovulation 05/19/2015  . Benign essential hypertension 05/19/2015  . Generalized anxiety disorder 05/19/2015  . Obesity 05/19/2015    Current Outpatient Medications on File Prior to Visit  Medication Sig Dispense Refill  . dextroamphetamine (DEXEDRINE SPANSULE) 15 MG 24 hr capsule Take 1 capsule (15 mg total) by mouth daily. 30 capsule 0  . omeprazole (PRILOSEC) 40 MG capsule Take 1 capsule (40 mg total) by mouth 2 (two) times daily before a meal. 90 capsule 3   No current facility-administered medications on file prior to visit.     Past Medical History:  Diagnosis Date  . Anxiety   . Hypertension   . Plantar fasciitis   . Venous insufficiency     Past Surgical History:  Procedure Laterality Date  . ESOPHAGEAL MANOMETRY N/A 09/25/2017   Procedure: ESOPHAGEAL MANOMETRY (EM);  Surgeon: Mauri Pole, MD;  Location: [facility];  Service: Endoscopy;  Laterality: N/A;  . WISDOM TOOTH EXTRACTION  2013    Social History   Socioeconomic History  . Marital status: Married    Spouse  name: Not on file  . Number of children: Not on file  . Years of education: Not on file  . Highest education level: Not on file  Occupational History  . Not on file  Social Needs  . Financial resource strain: Not on file  . Food insecurity:    Worry: Not on file    Inability: Not on file  . Transportation needs:    Medical: Not on file    Non-medical: Not on file  Tobacco Use  .  Smoking status: Never Smoker  . Smokeless tobacco: Never Used  Substance and Sexual Activity  . Alcohol use: Yes    Frequency: Never    Comment: once every 6 months   . Drug use: Never  . Sexual activity: Not on file  Lifestyle  . Physical activity:    Days per week: Not on file    Minutes per session: Not on file  . Stress: Not on file  Relationships  . Social connections:    Talks on phone: Not on file    Gets together: Not on file    Attends religious service: Not on file    Active member of club or organization: Not on file    Attends meetings of clubs or organizations: Not on file    Relationship status: Not on file  Other Topics Concern  . Not on file  Social History Narrative  . Not on file    Family History  Problem Relation Age of Onset  . Cancer Mother   . Hypertension Father   . Hyperlipidemia Father   . Anxiety disorder Father   . Depression Father   . Heart disease Father   . AAA (abdominal aortic aneurysm) Father   . Depression Sister   . Anxiety disorder Sister   . Heart disease Maternal Grandmother   . Alzheimer's disease Maternal Grandmother   . Stroke Maternal Grandmother   . Gout Maternal Grandfather   . Heart disease Paternal Grandfather         Review of Systems  Constitutional: Negative for fever, malaise/fatigue and weight loss.  HENT: Negative for congestion and sore throat.   Eyes:       Negative for visual changes  Respiratory: Negative for cough and shortness of breath.   Cardiovascular: Negative for chest pain, palpitations and leg swelling.  Gastrointestinal: Negative for blood in stool, constipation, diarrhea and heartburn.       Needs referral to GI for colonoscopy due to FHx of colon cancer.  Genitourinary: Negative for dysuria, frequency and urgency.  Musculoskeletal: Positive for joint pain. Negative for falls and myalgias.       Foot pain, followed by podiatry, needs another referral  Skin: Negative for rash.    Neurological: Negative for dizziness, sensory change and headaches.  Endo/Heme/Allergies: Does not bruise/bleed easily.  Psychiatric/Behavioral: Negative for depression, substance abuse and suicidal ideas. The patient is not nervous/anxious.     Objective:   Vitals:   02/07/18 0807  BP: (!) 132/100  Pulse: 74  Temp: 98.7 F (37.1 C)  SpO2: 98%    Body mass index is 47.53 kg/m.   Physical Examination:  Physical Exam Vitals signs reviewed.  HENT:     Right Ear: Tympanic membrane, ear canal and external ear normal.     Left Ear: Tympanic membrane, ear canal and external ear normal.     Nose: Nose normal.     Mouth/Throat:     Mouth: Mucous membranes are dry.  Pharynx: Oropharynx is clear.  Eyes:     Extraocular Movements: Extraocular movements intact.     Conjunctiva/sclera: Conjunctivae normal.     Pupils: Pupils are equal, round, and reactive to light.  Neck:     Musculoskeletal: Normal range of motion and neck supple.  Cardiovascular:     Rate and Rhythm: Normal rate and regular rhythm.     Pulses: Normal pulses.     Heart sounds: Normal heart sounds.  Pulmonary:     Effort: Pulmonary effort is normal.     Breath sounds: Normal breath sounds.  Chest:     Comments: Breast exam deferred to GYN Abdominal:     General: Bowel sounds are normal.     Palpations: Abdomen is soft.     Tenderness: There is no abdominal tenderness.  Genitourinary:    Comments: Deferred to GYN Musculoskeletal:     Right lower leg: Edema present.     Left lower leg: Edema present.  Lymphadenopathy:     Cervical: No cervical adenopathy.  Neurological:     Mental Status: She is alert and oriented to person, place, and time.  Psychiatric:        Mood and Affect: Mood normal.        Behavior: Behavior normal.        Thought Content: Thought content normal.     ASSESSMENT and PLAN:  Cheryl Ayala was seen today for annual exam.  Diagnoses and all orders for this visit:  Encounter  for preventative adult health care exam with abnormal findings -     TSH -     Lipid panel  Benign essential hypertension -     CBC -     Comprehensive metabolic panel -     labetalol (NORMODYNE) 200 MG tablet; Take 1 tablet (200 mg total) by mouth 2 (two) times daily. -     amLODipine (NORVASC) 5 MG tablet; Take 1 tablet (5 mg total) by mouth daily.  Adult ADHD -     Pain Mgmt, Profile 8 w/Conf, U  Controlled substance agreement signed -     Pain Mgmt, Profile 8 w/Conf, U  Major depressive disorder with single episode, in full remission (HCC)  Generalized anxiety disorder -     sertraline (ZOLOFT) 100 MG tablet; Take 1 tablet (100 mg total) by mouth daily.  Plantar fasciitis of left foot -     Ambulatory referral to Podiatry  Esophageal dysphagia -     Ambulatory referral to Gastroenterology  GERD with esophagitis -     Ambulatory referral to Gastroenterology  Hyperglycemia -     Hemoglobin A1c  Encounter for lipid screening for cardiovascular disease -     Lipid panel   No problem-specific Assessment & Plan notes found for this encounter.     Problem List Items Addressed This Visit      Cardiovascular and Mediastinum   Benign essential hypertension   Relevant Medications   labetalol (NORMODYNE) 200 MG tablet   amLODipine (NORVASC) 5 MG tablet   Other Relevant Orders   CBC (Completed)   Comprehensive metabolic panel (Completed)     Digestive   Esophageal dysphagia   Relevant Orders   Ambulatory referral to Gastroenterology   GERD with esophagitis   Relevant Orders   Ambulatory referral to Gastroenterology     Musculoskeletal and Integument   Plantar fasciitis of left foot   Relevant Orders   Ambulatory referral to Podiatry     Other   Adult  ADHD   Relevant Orders   Pain Mgmt, Profile 8 w/Conf, U (Completed)   Controlled substance agreement signed   Relevant Orders   Pain Mgmt, Profile 8 w/Conf, U (Completed)   Depression   Relevant Medications    sertraline (ZOLOFT) 100 MG tablet   Generalized anxiety disorder   Relevant Medications   sertraline (ZOLOFT) 100 MG tablet   Hyperglycemia   Relevant Orders   Hemoglobin A1c (Completed)    Other Visit Diagnoses    Encounter for preventative adult health care exam with abnormal findings    -  Primary   Relevant Orders   TSH (Completed)   Lipid panel (Completed)   Encounter for lipid screening for cardiovascular disease       Relevant Orders   Lipid panel (Completed)       Follow up: Return in about 3 months (around 05/09/2018) for HTN and ADHD.  Wilfred Lacy, NP

## 2018-02-09 ENCOUNTER — Encounter: Payer: Self-pay | Admitting: Nurse Practitioner

## 2018-02-09 DIAGNOSIS — E1165 Type 2 diabetes mellitus with hyperglycemia: Secondary | ICD-10-CM | POA: Insufficient documentation

## 2018-02-09 DIAGNOSIS — R739 Hyperglycemia, unspecified: Secondary | ICD-10-CM | POA: Insufficient documentation

## 2018-02-09 DIAGNOSIS — E119 Type 2 diabetes mellitus without complications: Secondary | ICD-10-CM | POA: Insufficient documentation

## 2018-02-09 LAB — PAIN MGMT, PROFILE 8 W/CONF, U
6 Acetylmorphine: NEGATIVE ng/mL (ref <10)
Alcohol Metabolites: NEGATIVE ng/mL (ref <500)
Amphetamine: 1143 ng/mL — ABNORMAL HIGH (ref <250)
Amphetamines: POSITIVE ng/mL — AB (ref <500)
Benzodiazepines: NEGATIVE ng/mL (ref <100)
Buprenorphine, Urine: NEGATIVE ng/mL (ref <5)
Cocaine Metabolite: NEGATIVE ng/mL (ref <150)
Creatinine: 175.6 mg/dL
MDMA: NEGATIVE ng/mL (ref <500)
Marijuana Metabolite: NEGATIVE ng/mL (ref <20)
Methamphetamine: NEGATIVE ng/mL (ref <250)
OXYCODONE: NEGATIVE ng/mL (ref <100)
Opiates: NEGATIVE ng/mL (ref <100)
Oxidant: NEGATIVE ug/mL (ref <200)
PH: 6.39 (ref 4.5–9.0)

## 2018-02-09 MED ORDER — SERTRALINE HCL 100 MG PO TABS: 100.0000 mg | ORAL_TABLET | Freq: Every day | ORAL | 3 refills | Status: DC

## 2018-02-10 ENCOUNTER — Encounter: Payer: Self-pay | Admitting: Gastroenterology

## 2018-02-10 ENCOUNTER — Ambulatory Visit (AMBULATORY_SURGERY_CENTER): Payer: No Typology Code available for payment source | Admitting: Gastroenterology

## 2018-02-10 VITALS — BP 115/68 | HR 68 | Temp 97.5°F | Resp 14 | Ht 67.0 in | Wt 311.0 lb

## 2018-02-10 DIAGNOSIS — K219 Gastro-esophageal reflux disease without esophagitis: Secondary | ICD-10-CM

## 2018-02-10 DIAGNOSIS — D128 Benign neoplasm of rectum: Secondary | ICD-10-CM

## 2018-02-10 DIAGNOSIS — K621 Rectal polyp: Secondary | ICD-10-CM

## 2018-02-10 DIAGNOSIS — K2970 Gastritis, unspecified, without bleeding: Secondary | ICD-10-CM

## 2018-02-10 DIAGNOSIS — D129 Benign neoplasm of anus and anal canal: Secondary | ICD-10-CM

## 2018-02-10 DIAGNOSIS — Z8371 Family history of colonic polyps: Secondary | ICD-10-CM

## 2018-02-10 DIAGNOSIS — Z1211 Encounter for screening for malignant neoplasm of colon: Secondary | ICD-10-CM | POA: Diagnosis not present

## 2018-02-10 DIAGNOSIS — K317 Polyp of stomach and duodenum: Secondary | ICD-10-CM | POA: Diagnosis not present

## 2018-02-10 DIAGNOSIS — Z830 Family history of human immunodeficiency virus [HIV] disease: Secondary | ICD-10-CM

## 2018-02-10 MED ORDER — SODIUM CHLORIDE 0.9 % IV SOLN: 500.0000 mL | Freq: Once | INTRAVENOUS | Status: DC

## 2018-02-10 MED FILL — SERTRALINE HCL 100 MG TAB: 100 | 90 days supply | Qty: 90 | Fill #0

## 2018-02-10 NOTE — Progress Notes (Signed)
PT taken to PACU. Monitors in place. VSS. Report given to RN. 

## 2018-02-10 NOTE — Op Note (Signed)
[facility] Patient Name: Cheryl Ayala Procedure Date: 02/10/2018 8:07 AM MRN: [id number] Endoscopist: Mauri Pole , MD Age: 33 Referring MD:  Date of Birth: [birth year] Gender: Female Account #: [id number] Procedure:                Upper GI endoscopy Indications:              Dysphagia, Esophageal reflux Medicines:                Monitored Anesthesia Care Procedure:                Pre-Anesthesia Assessment:                           - Prior to the procedure, a History and Physical                            was performed, and patient medications and                            allergies were reviewed. The patient's tolerance of                            previous anesthesia was also reviewed. The risks                            and benefits of the procedure and the sedation                            options and risks were discussed with the patient.                            All questions were answered, and informed consent                            was obtained. Prior Anticoagulants: The patient has                            taken no previous anticoagulant or antiplatelet                            agents. ASA Grade Assessment: III - A patient with                            severe systemic disease. After reviewing the risks                            and benefits, the patient was deemed in                            satisfactory condition to undergo the procedure.                           After obtaining informed consent, the endoscope was  passed under direct vision. Throughout the                            procedure, the patient's blood pressure, pulse, and                            oxygen saturations were monitored continuously. The                            Endoscope was introduced through the mouth, and                            advanced to the second part of duodenum. The upper                            GI endoscopy was  accomplished without difficulty.                            The patient tolerated the procedure well. Scope In: Scope Out: Findings:                 The Z-line was regular and was found 38 cm from the                            incisors.                           No endoscopic abnormality was evident in the                            esophagus to explain the patient's complaint of                            dysphagia.                           A 2 cm hiatal hernia was present.                           Patchy mild inflammation characterized by                            congestion (edema) and erythema was found in the                            gastric antrum and in the prepyloric region of the                            stomach. Biopsies were taken with a cold forceps                            for Helicobacter pylori testing.                           A few less than 5 mm sessile polyps ,  benign                            appearing fundic gland polyps with no stigmata of                            recent bleeding were found in the gastric fundus.                           The cardia and gastric fundus were otherwise normal                            on retroflexion.                           The examined duodenum was normal. Complications:            No immediate complications. Estimated Blood Loss:     Estimated blood loss was minimal. Impression:               - Z-line regular, 38 cm from the incisors.                           - No endoscopic esophageal abnormality to explain                            patient's dysphagia.                           - 2 cm hiatal hernia.                           - Gastritis. Biopsied.                           - A few benign gastric fundic polyps.                           - Normal examined duodenum. Recommendation:           - Patient has a contact number available for                            emergencies. The signs and symptoms of potential                             delayed complications were discussed with the                            patient. Return to normal activities tomorrow.                            Written discharge instructions were provided to the                            patient.                           -  Resume previous diet.                           - Continue present medications.                           - Await pathology results.                           - See the other procedure note for documentation of                            additional recommendations. Mauri Pole, MD 02/10/2018 8:46:15 AM This report has been signed electronically.

## 2018-02-10 NOTE — Progress Notes (Signed)
Called to room to assist during endoscopic procedure.  Patient ID and intended procedure confirmed with present staff. Received instructions for my participation in the procedure from the performing physician.  

## 2018-02-10 NOTE — Patient Instructions (Addendum)
Handouts given on polyps and gastritis and diverticulosis and hemorrhoids and hiatal hernia  YOU HAD AN ENDOSCOPIC PROCEDURE TODAY AT [facility]:   Refer to the procedure report that was given to you for any specific questions about what was found during the examination.  If the procedure report does not answer your questions, please call your gastroenterologist to clarify.  If you requested that your care partner not be given the details of your procedure findings, then the procedure report has been included in a sealed envelope for you to review at your convenience later.  YOU SHOULD EXPECT: Some feelings of bloating in the abdomen. Passage of more gas than usual.  Walking can help get rid of the air that was put into your GI tract during the procedure and reduce the bloating. If you had a lower endoscopy (such as a colonoscopy or flexible sigmoidoscopy) you may notice spotting of blood in your stool or on the toilet paper. If you underwent a bowel prep for your procedure, you may not have a normal bowel movement for a few days.  Please Note:  You might notice some irritation and congestion in your nose or some drainage.  This is from the oxygen used during your procedure.  There is no need for concern and it should clear up in a day or so.  SYMPTOMS TO REPORT IMMEDIATELY:   Following lower endoscopy (colonoscopy or flexible sigmoidoscopy):  Excessive amounts of blood in the stool  Significant tenderness or worsening of abdominal pains  Swelling of the abdomen that is new, acute  Fever of 100F or higher   Following upper endoscopy (EGD)  Vomiting of blood or coffee ground material  New chest pain or pain under the shoulder blades  Painful or persistently difficult swallowing  New shortness of breath  Fever of 100F or higher  Black, tarry-looking stools  For urgent or emergent issues, a gastroenterologist can be reached at any hour by calling [phone].   DIET:   We do recommend a small meal at first, but then you may proceed to your regular diet.  Drink plenty of fluids but you should avoid alcoholic beverages for 24 hours.  ACTIVITY:  You should plan to take it easy for the rest of today and you should NOT DRIVE or use heavy machinery until tomorrow (because of the sedation medicines used during the test).    FOLLOW UP: Our staff will call the number listed on your records the next business day following your procedure to check on you and address any questions or concerns that you may have regarding the information given to you following your procedure. If we do not reach you, we will leave a message.  However, if you are feeling well and you are not experiencing any problems, there is no need to return our call.  We will assume that you have returned to your regular daily activities without incident.  If any biopsies were taken you will be contacted by phone or by letter within the next 1-3 weeks.  Please call us at [phone] if you have not heard about the biopsies in 3 weeks.    SIGNATURES/CONFIDENTIALITY: You and/or your care partner have signed paperwork which will be entered into your electronic medical record.  These signatures attest to the fact that that the information above on your After Visit Summary has been reviewed and is understood.  Full responsibility of the confidentiality of this discharge information lies with you and/or  your care-partner. 

## 2018-02-10 NOTE — Op Note (Signed)
[facility] Patient Name: Cheryl Ayala Procedure Date: 02/10/2018 8:06 AM MRN: [id number] Endoscopist: Mauri Pole , MD Age: 33 Referring MD:  Date of Birth: 16-Dec-1985 Gender: Female Account #: [id number] Procedure:                Colonoscopy Indications:              Colon cancer screening in patient at increased                            risk: Family history of 1st-degree relative with                            colon polyps before age 79 years, Colon cancer                            screening in patient at increased risk: Family                            history of colon polyps in multiple 1st-degree                            relatives, Colon cancer screening in patient with                            1st-degree relative having sessile serrated colon                            polyp (10 mm or greater in size) before age 64,                            Colon cancer screening in patient with multiple                            1st-degree relatives having sessile serrated colon                            polyps (10 mm or greater in size) Medicines:                Monitored Anesthesia Care Procedure:                Pre-Anesthesia Assessment:                           - Prior to the procedure, a History and Physical                            was performed, and patient medications and                            allergies were reviewed. The patient's tolerance of                            previous anesthesia was also reviewed. The risks  and benefits of the procedure and the sedation                            options and risks were discussed with the patient.                            All questions were answered, and informed consent                            was obtained. Prior Anticoagulants: The patient has                            taken no previous anticoagulant or antiplatelet                            agents. ASA Grade  Assessment: III - A patient with                            severe systemic disease. After reviewing the risks                            and benefits, the patient was deemed in                            satisfactory condition to undergo the procedure.                           After obtaining informed consent, the colonoscope                            was passed under direct vision. Throughout the                            procedure, the patient's blood pressure, pulse, and                            oxygen saturations were monitored continuously. The                            Model PCF-H190DL [id number]) scope was introduced                            through the anus and advanced to the the cecum,                            identified by appendiceal orifice and ileocecal                            valve. The colonoscopy was performed without                            difficulty. The patient tolerated the procedure  well. The quality of the bowel preparation was                            excellent. The ileocecal valve, appendiceal                            orifice, and rectum were photographed. Scope In: 8:18:55 AM Scope Out: 8:33:28 AM Scope Withdrawal Time: 0 hours 12 minutes 53 seconds  Total Procedure Duration: 0 hours 14 minutes 33 seconds  Findings:                 The perianal and digital rectal examinations were                            normal.                           A 2 mm polyp was found in the rectum. The polyp was                            sessile. The polyp was removed with a cold biopsy                            forceps. Resection and retrieval were complete.                           A few small-mouthed diverticula were found in the                            sigmoid colon.                           Non-bleeding internal hemorrhoids were found during                            retroflexion. The hemorrhoids were  small. Complications:            No immediate complications. Estimated Blood Loss:     Estimated blood loss was minimal. Impression:               - One 2 mm polyp in the rectum, removed with a cold                            biopsy forceps. Resected and retrieved.                           - Mild diverticulosis in the sigmoid colon.                           - Non-bleeding internal hemorrhoids. Recommendation:           - Patient has a contact number available for                            emergencies. The signs and symptoms of potential  delayed complications were discussed with the                            patient. Return to normal activities tomorrow.                            Written discharge instructions were provided to the                            patient.                           - Resume previous diet.                           - Continue present medications.                           - Await pathology results.                           - Repeat colonoscopy in 5-10 years for surveillance                            based on pathology results. Mauri Pole, MD 02/10/2018 8:49:29 AM This report has been signed electronically.

## 2018-02-10 NOTE — Progress Notes (Signed)
Pt's states no medical or surgical changes since previsit or office visit. 

## 2018-02-11 ENCOUNTER — Telehealth: Payer: Self-pay

## 2018-02-11 MED ORDER — AMLODIPINE BESYLATE 5 MG PO TABS: 5.0000 mg | ORAL_TABLET | Freq: Every day | ORAL | 3 refills | Status: DC

## 2018-02-11 MED ORDER — LABETALOL HCL 200 MG PO TABS: 200.0000 mg | ORAL_TABLET | Freq: Two times a day (BID) | ORAL | 1 refills | Status: DC

## 2018-02-11 NOTE — Addendum Note (Signed)
Addended by: Wilfred Lacy L on: 02/11/2018 12:45 PM   Modules accepted: Orders

## 2018-02-11 NOTE — Telephone Encounter (Signed)
  Follow up Call-  Call back number 02/10/2018  Post procedure Call Back phone  # [phone]  Permission to leave phone message Yes  Some recent data might be hidden     Patient questions:  Do you have a fever, pain , or abdominal swelling? No. Pain Score  0 *  Have you tolerated food without any problems? Yes.    Have you been able to return to your normal activities? Yes.    Do you have any questions about your discharge instructions: Diet   No. Medications  No. Follow up visit  No.  Do you have questions or concerns about your Care? No.  Actions: * If pain score is 4 or above: No action needed, pain <4.

## 2018-02-12 ENCOUNTER — Encounter: Payer: Self-pay | Admitting: Nurse Practitioner

## 2018-02-12 NOTE — Progress Notes (Signed)
Abstracted result and sent to scan  

## 2018-02-17 ENCOUNTER — Encounter: Payer: Self-pay | Admitting: Gastroenterology

## 2018-02-24 ENCOUNTER — Encounter: Payer: Self-pay | Admitting: Podiatry

## 2018-03-05 ENCOUNTER — Telehealth: Payer: No Typology Code available for payment source | Admitting: Family

## 2018-03-05 DIAGNOSIS — R05 Cough: Secondary | ICD-10-CM

## 2018-03-05 DIAGNOSIS — R059 Cough, unspecified: Secondary | ICD-10-CM

## 2018-03-05 MED ORDER — BENZONATATE 100 MG PO CAPS: 100.0000 mg | ORAL_CAPSULE | Freq: Three times a day (TID) | ORAL | 0 refills | Status: DC | PRN

## 2018-03-05 MED ORDER — DOXYCYCLINE HYCLATE 100 MG PO TABS: 100.0000 mg | ORAL_TABLET | Freq: Two times a day (BID) | ORAL | 0 refills | Status: DC

## 2018-03-05 MED ORDER — PREDNISONE 10 MG (21) PO TBPK: ORAL_TABLET | ORAL | 0 refills | Status: DC

## 2018-03-05 MED FILL — DOXYCYCLINE HYCLATE 100 MG: 100 | 10 days supply | Qty: 20 | Fill #0

## 2018-03-05 NOTE — Progress Notes (Signed)
We are sorry that you are not feeling well.  Here is how we plan to help!  Based on your presentation I believe you most likely have A cough due to a virus.  This is called viral bronchitis and is best treated by rest, plenty of fluids and control of the cough.  You may use Ibuprofen or Tylenol as directed to help your symptoms.     In addition you may use A non-prescription cough medication called Robitussin DAC. Take 2 teaspoons every 8 hours or Delsym: take 2 teaspoons every 12 hours., A non-prescription cough medication called Mucinex DM: take 2 tablets every 12 hours. and A prescription cough medication called Tessalon Perles 100mg . You may take 1-2 capsules every 8 hours as needed for your cough.  Prednisone 10 mg daily for 6 days (see taper instructions below)  Directions for 6 day taper: Day 1: 2 tablets before breakfast, 1 after both lunch & dinner and 2 at bedtime Day 2: 1 tab before breakfast, 1 after both lunch & dinner and 2 at bedtime Day 3: 1 tab at each meal & 1 at bedtime Day 4: 1 tab at breakfast, 1 at lunch, 1 at bedtime Day 5: 1 tab at breakfast & 1 tab at bedtime Day 6: 1 tab at breakfast  Approximately 5 minutes was spent reviewing and documenting in patient's chart.   From your responses in the eVisit questionnaire you describe inflammation in the upper respiratory tract which is causing a significant cough.  This is commonly called Bronchitis and has four common causes:    Allergies  Viral Infections  Acid Reflux  Bacterial Infection Allergies, viruses and acid reflux are treated by controlling symptoms or eliminating the cause. An example might be a cough caused by taking certain blood pressure medications. You stop the cough by changing the medication. Another example might be a cough caused by acid reflux. Controlling the reflux helps control the cough.  USE OF BRONCHODILATOR ("RESCUE") INHALERS: There is a risk from using your bronchodilator too frequently.   The risk is that over-reliance on a medication which only relaxes the muscles surrounding the breathing tubes can reduce the effectiveness of medications prescribed to reduce swelling and congestion of the tubes themselves.  Although you feel brief relief from the bronchodilator inhaler, your asthma may actually be worsening with the tubes becoming more swollen and filled with mucus.  This can delay other crucial treatments, such as oral steroid medications. If you need to use a bronchodilator inhaler daily, several times per day, you should discuss this with your provider.  There are probably better treatments that could be used to keep your asthma under control.     HOME CARE . Only take medications as instructed by your medical team. . Complete the entire course of an antibiotic. . Drink plenty of fluids and get plenty of rest. . Avoid close contacts especially the very young and the elderly . Cover your mouth if you cough or cough into your sleeve. . Always remember to wash your hands . A steam or ultrasonic humidifier can help congestion.   GET HELP RIGHT AWAY IF: . You develop worsening fever. . You become short of breath . You cough up blood. . Your symptoms persist after you have completed your treatment plan MAKE SURE YOU   Understand these instructions.  Will watch your condition.  Will get help right away if you are not doing well or get worse.  Your e-visit answers were reviewed by  a board certified advanced clinical practitioner to complete your personal care plan.  Depending on the condition, your plan could have included both over the counter or prescription medications. If there is a problem please reply  once you have received a response from your provider. Your safety is important to [place].  If you have drug allergies check your prescription carefully.    You can use MyChart to ask questions about today's visit, request a non-urgent call back, or ask for a work or school  excuse for 24 hours related to this e-Visit. If it has been greater than 24 hours you will need to follow up with your provider, or enter a new e-Visit to address those concerns. You will get an e-mail in the next two days asking about your experience.  I hope that your e-visit has been valuable and will speed your recovery. Thank you for using e-visits.

## 2018-03-05 NOTE — Addendum Note (Signed)
Addended by: Evelina Dun A on: 03/05/2018 03:29 PM   Modules accepted: Orders

## 2018-03-06 ENCOUNTER — Encounter: Payer: Self-pay | Admitting: Nurse Practitioner

## 2018-03-06 DIAGNOSIS — G4719 Other hypersomnia: Secondary | ICD-10-CM

## 2018-03-06 DIAGNOSIS — F988 Other specified behavioral and emotional disorders with onset usually occurring in childhood and adolescence: Secondary | ICD-10-CM

## 2018-03-06 MED ORDER — DEXTROAMPHETAMINE SULFATE ER 15 MG PO CP24: 15.0000 mg | ORAL_CAPSULE | Freq: Every day | ORAL | 0 refills | Status: DC

## 2018-03-06 MED FILL — D-AMPHETAMINE ER 15 MG CAP: 15 | 30 days supply | Qty: 30 | Fill #0

## 2018-03-06 NOTE — Telephone Encounter (Signed)
Cheryl Ayala please advise, PMP checked last 3 pick up was 10/29/17,12/16/17,02/03/2018 for 30 tab at [facility].   Last ov was 02/03/2018 and next ov scheduled on 04/2018.  Please advise.

## 2018-03-10 ENCOUNTER — Ambulatory Visit (INDEPENDENT_AMBULATORY_CARE_PROVIDER_SITE_OTHER): Payer: No Typology Code available for payment source | Admitting: Podiatry

## 2018-03-10 ENCOUNTER — Encounter: Payer: Self-pay | Admitting: Podiatry

## 2018-03-10 DIAGNOSIS — M722 Plantar fascial fibromatosis: Secondary | ICD-10-CM

## 2018-03-10 DIAGNOSIS — Z9889 Other specified postprocedural states: Secondary | ICD-10-CM

## 2018-03-10 MED ORDER — TRIAMCINOLONE ACETONIDE 10 MG/ML IJ SUSP
10.0000 mg | Freq: Once | INTRAMUSCULAR | Status: AC
  Administered 2018-03-10: 10 mg

## 2018-03-13 NOTE — Progress Notes (Signed)
Subjective:   Patient ID: Cheryl Ayala, female   DOB: 33 y.o.   MRN: [id number]   HPI Patient states the left heel is doing very well and the right one has really been bothering me and making it hard to be active.  Patient states that she is very pleased so far and is returned to work normal fashion   ROS      Objective:  Physical Exam  Neurovascular status intact negative Homans sign noted with patient's left heel healing well with mild discomfort still when doing a lot of walking but overall much improved with quite a bit of discomfort in the right heel at the insertional point of the tendon calcaneus     Assessment:  Acute fascial symptomatology right with significant improvement of the left plantar fascia at the insertion calcaneus     Plan:  H&P condition reviewed and discussed eventual correction of the right which she wants done but cannot do for several months.  Today went ahead and did sterile prep and injected the right plantar fascial 3 mg Kenalog 5 mg Xylocaine and advised her on surgery which will hopefully be performed in June.  Patient will be seen back in 2 months and if symptoms get worse we may need to do something in a quicker fashion.  For the left she will continue with ice therapy and stretching exercises

## 2018-03-14 ENCOUNTER — Ambulatory Visit: Payer: No Typology Code available for payment source | Admitting: Podiatry

## 2018-03-28 ENCOUNTER — Other Ambulatory Visit: Payer: Self-pay | Admitting: Gastroenterology

## 2018-03-28 DIAGNOSIS — K219 Gastro-esophageal reflux disease without esophagitis: Secondary | ICD-10-CM

## 2018-03-28 DIAGNOSIS — R1310 Dysphagia, unspecified: Secondary | ICD-10-CM

## 2018-03-28 MED ORDER — SUCRALFATE 1 GM/10ML PO SUSP: 1.0000 g | Freq: Four times a day (QID) | ORAL | 1 refills | Status: DC

## 2018-03-29 MED FILL — SUCRALFATE 1 GM/10ML SUSP: 1 | 10 days supply | Qty: 420 | Fill #0

## 2018-03-31 ENCOUNTER — Encounter: Payer: Self-pay | Admitting: Nurse Practitioner

## 2018-03-31 ENCOUNTER — Telehealth: Payer: Self-pay | Admitting: *Deleted

## 2018-03-31 NOTE — Telephone Encounter (Signed)
"  I'd like to schedule my surgery with Dr. Paulla Dolly.  I was going out of town on vacation but that has gotten canceled."  Dr. Paulla Dolly does surgeries on Tuesdays.  Do you have a date that you like for the surgery?  "I'd like to do it on April 7."  You will need an appointment with Dr. Paulla Dolly for a consultation.  Would you like me to transfer you to a scheduler so we can get you scheduled for an appointment with Dr. Paulla Dolly?  "Yes, that will be fine."  I transferred her to [place].

## 2018-04-01 ENCOUNTER — Other Ambulatory Visit: Payer: Self-pay

## 2018-04-01 MED ORDER — SUCRALFATE 1 GM/10ML PO SUSP: 1.0000 g | Freq: Four times a day (QID) | ORAL | 3 refills | Status: DC

## 2018-04-03 ENCOUNTER — Other Ambulatory Visit: Payer: Self-pay

## 2018-04-03 ENCOUNTER — Ambulatory Visit (INDEPENDENT_AMBULATORY_CARE_PROVIDER_SITE_OTHER): Payer: No Typology Code available for payment source | Admitting: Podiatry

## 2018-04-03 ENCOUNTER — Encounter: Payer: Self-pay | Admitting: Podiatry

## 2018-04-03 DIAGNOSIS — M722 Plantar fascial fibromatosis: Secondary | ICD-10-CM | POA: Diagnosis not present

## 2018-04-03 NOTE — Progress Notes (Signed)
Subjective:   Patient ID: Cheryl Ayala, female   DOB: 33 y.o.   MRN: [id number]   HPI Patient states the left heel continues to improve with mild pain in the arch but the right heel is horrible and she knows she needs surgery on   ROS      Objective:  Physical Exam  Neurovascular status intact with exquisite discomfort plantar aspect right heel insertional point calcaneus with the left doing very well with minimal discomfort noted     Assessment:  Acute plantar fasciitis right failure to respond conservatively with well-healing surgical site left     Plan:  H&P reviewed condition and recommended that surgery for the right will be necessary.  I did explain procedure and risk and patient wants surgery understanding correction and understanding the difficulty of the correction.  Patient understands discussed the left did so well no guarantees on the right and after extensive review signed consent form.  Patient is scheduled for outpatient surgery and is encouraged to call with any questions prior to procedure

## 2018-04-07 ENCOUNTER — Encounter: Payer: Self-pay | Admitting: Nurse Practitioner

## 2018-04-07 ENCOUNTER — Telehealth: Payer: No Typology Code available for payment source | Admitting: Gastroenterology

## 2018-04-07 ENCOUNTER — Telehealth: Payer: Self-pay | Admitting: *Deleted

## 2018-04-07 NOTE — Telephone Encounter (Signed)
"  I'm scheduled to have surgery on Tuesday of next week  Is it still on?"  We actually need to reschedule your surgery.  Dr. Paulla Dolly wants to do it on April 14.  Is that date okay with you?  "Yes, I'm glad I called."  I was making calls as we speak. If anything else changes, I'll let you know.  "Okay, thanks."

## 2018-04-08 ENCOUNTER — Encounter: Payer: Self-pay | Admitting: Nurse Practitioner

## 2018-04-08 ENCOUNTER — Telehealth (INDEPENDENT_AMBULATORY_CARE_PROVIDER_SITE_OTHER): Payer: No Typology Code available for payment source | Admitting: Nurse Practitioner

## 2018-04-08 VITALS — BP 130/86 | HR 88 | Wt 306.0 lb

## 2018-04-08 DIAGNOSIS — F325 Major depressive disorder, single episode, in full remission: Secondary | ICD-10-CM

## 2018-04-08 DIAGNOSIS — F988 Other specified behavioral and emotional disorders with onset usually occurring in childhood and adolescence: Secondary | ICD-10-CM

## 2018-04-08 DIAGNOSIS — F909 Attention-deficit hyperactivity disorder, unspecified type: Secondary | ICD-10-CM

## 2018-04-08 DIAGNOSIS — R05 Cough: Secondary | ICD-10-CM

## 2018-04-08 DIAGNOSIS — G4719 Other hypersomnia: Secondary | ICD-10-CM

## 2018-04-08 DIAGNOSIS — R059 Cough, unspecified: Secondary | ICD-10-CM

## 2018-04-08 DIAGNOSIS — I10 Essential (primary) hypertension: Secondary | ICD-10-CM

## 2018-04-08 DIAGNOSIS — K2100 Gastro-esophageal reflux disease with esophagitis, without bleeding: Secondary | ICD-10-CM

## 2018-04-08 MED ORDER — DEXTROAMPHETAMINE SULFATE ER 15 MG PO CP24: 15.0000 mg | ORAL_CAPSULE | Freq: Every day | ORAL | 0 refills | Status: DC

## 2018-04-08 MED ORDER — CETIRIZINE HCL 10 MG PO TABS: 10.0000 mg | ORAL_TABLET | Freq: Every day | ORAL | 0 refills | Status: DC

## 2018-04-08 NOTE — Assessment & Plan Note (Signed)
>>  ASSESSMENT AND PLAN FOR DEPRESSION WRITTEN ON 04/08/2018  1:31 PM BY Lyle Leisner LUM, NP  Stable mood per patient. She weaned off zoloft 2weeks ago. States she no longer wants to take medication. Denies any change in mood since discontinuation of medication. Ongoing weekly counseling sessions. F/up in 66months

## 2018-04-08 NOTE — Patient Instructions (Signed)
Maintain appt with GI. Start zrytec OTC 10mg  once a day. If no additional recommendations from GI and no improvement with zyrtec after 1week, call office.

## 2018-04-08 NOTE — Progress Notes (Signed)
Virtual Visit via Video Note  I connected with Cheryl Ayala on 04/08/18 at  1:00 PM EDT by a video enabled telemedicine application and verified that I am speaking with the correct person using two identifiers.   I discussed the limitations of evaluation and management by telemedicine and the availability of in person appointments. The patient expressed understanding and agreed to proceed.  History of Present Illness: Cough  This is a new problem. The current episode started more than 1 month ago. The problem has been waxing and waning. The cough is productive of sputum. Associated symptoms include heartburn. Pertinent negatives include no chest pain, chills, ear congestion, ear pain, fever, headaches, hemoptysis, myalgias, nasal congestion, postnasal drip, rhinorrhea, sore throat, sweats, weight loss or wheezing. Exacerbated by: any food intake. Treatments tried: omeprazole, sucralfate, and doxycycline. The treatment provided mild relief. Her past medical history is significant for environmental allergies.  oral abx (doxycycline) completed last month for acute sinusitis. Cough and globus sensation worse with any food intake, no dysphagia, no weight loss, no night sweats. Hx of GERD with esophagitis. Last seen by GI 12/2017. Has appt with GI tomorrow. Endoscopy and colonoscopy completed 01/2018: gastritis, hiatal hernia, colon polyp.  Depression and Anxiety: Stable mood per patient. She weaned off zoloft 2weeks ago. States she no longer wants to take medication. Denies any change in mood since discontinuation of medication. Ongoing weekly counseling sessions. Depression screen [facility] 2/9 04/08/2018 02/07/2018 08/07/2017  Decreased Interest 0 0 0  Down, Depressed, Hopeless 0 0 0  PHQ - 2 Score 0 0 0   Adult ADD: Current use of dexedrine 15mg . Up to date with UDS: 01/2018. PMP database printed and reviewed today. Last Dexedrine refill 03/06/2018, #30, No red flags. Stable BP, mood, sleep and  appetite.  Observations/Objective: BP Readings from Last 3 Encounters:  04/08/18 130/86  02/10/18 115/68  02/07/18 (!) 132/100   Wt Readings from Last 3 Encounters:  04/08/18 (!) 306 lb (138.8 kg)  02/10/18 (!) 311 lb (141.1 kg)  02/07/18 (!) 308 lb (139.7 kg)   During video call she appears stable, no respiratory distress, alert and oriented x 4, normal speech and tone, normal cognition.  Assessment and Plan: Diagnoses and all orders for this visit:  Cough -     cetirizine (ZYRTEC) 10 MG tablet; Take 1 tablet (10 mg total) by mouth daily.  Benign essential hypertension  Major depressive disorder with single episode, in full remission [facility])  Adult ADHD  Attention deficit disorder (ADD) without hyperactivity -     dextroamphetamine (DEXEDRINE SPANSULE) 15 MG 24 hr capsule; Take 1 capsule (15 mg total) by mouth daily.  Excessive daytime sleepiness -     dextroamphetamine (DEXEDRINE SPANSULE) 15 MG 24 hr capsule; Take 1 capsule (15 mg total) by mouth daily.  GERD with esophagitis   Follow Up Instructions: Maintain appt with GI. Start zrytec OTC 10mg  once a day. If no additional recommendations from GI and no improvement with zyrtec after 1week, call office   I discussed the assessment and treatment plan with the patient. The patient was provided an opportunity to ask questions and all were answered. The patient agreed with the plan and demonstrated an understanding of the instructions.   The patient was advised to call back or seek an in-person evaluation if the symptoms worsen or if the condition fails to improve as anticipated.  I provided 30 minutes of non-face-to-face time during this encounter.   Wilfred Lacy, NP

## 2018-04-08 NOTE — Assessment & Plan Note (Signed)
Up to date with UDS: 01/2018. PMP database printed and reviewed today. Last Dexedrine refill 03/06/2018, #30 No red flags. Stable BP, mood, sleep and appetite. F/up in 36months

## 2018-04-08 NOTE — Assessment & Plan Note (Signed)
Stable with labetalol and amlodipine. persistent LE edema, but stable Improves with exercise and low sodium diet. BP Readings from Last 3 Encounters:  04/08/18 130/86  02/10/18 115/68  02/07/18 (!) 132/100   Continue current medication F/up in 63months

## 2018-04-08 NOTE — Assessment & Plan Note (Signed)
Stable mood per patient. She weaned off zoloft 2weeks ago. States she no longer wants to take medication. Denies any change in mood since discontinuation of medication. Ongoing weekly counseling sessions. F/up in 19months

## 2018-04-08 NOTE — Progress Notes (Signed)
Please help call the pt for a follow up as [facility] request.   Thank you Laurey Arrow

## 2018-04-08 NOTE — Assessment & Plan Note (Signed)
>>  ASSESSMENT AND PLAN FOR BENIGN ESSENTIAL HYPERTENSION WRITTEN ON 04/08/2018  1:30 PM BY Abra Lingenfelter LUM, NP  Stable with labetalol and amlodipine. persistent LE edema, but stable Improves with exercise and low sodium diet. BP Readings from Last 3 Encounters:  04/08/18 130/86  02/10/18 115/68  02/07/18 (!) 132/100   Continue current medication F/up in 56months

## 2018-04-08 NOTE — Progress Notes (Signed)
Follow up on Dexedrine refill. Pt report BP 04/06/2018 of 120/84 pulse 84 and today BP 130/86 pulse 88.

## 2018-04-08 NOTE — Assessment & Plan Note (Signed)
Cough and globus sensation worse with any food intake, no dysphagia, no weight loss, no night sweats. Current use of omeprazole and sucalfate Hx of GERD with esophagitis. Last seen by GI 12/2017. Has appt with GI tomorrow. Endoscopy and colonoscopy completed 01/2018: gastritis, hiatal hernia, colon polyp.  Start zrytec OTC 10mg  once a day. If no additional recommendations from GI and no improvement with zyrtec after 1week, call office

## 2018-04-09 ENCOUNTER — Telehealth (INDEPENDENT_AMBULATORY_CARE_PROVIDER_SITE_OTHER): Payer: No Typology Code available for payment source | Admitting: Gastroenterology

## 2018-04-09 ENCOUNTER — Other Ambulatory Visit: Payer: Self-pay

## 2018-04-09 ENCOUNTER — Telehealth: Payer: Self-pay | Admitting: Gastroenterology

## 2018-04-09 DIAGNOSIS — R05 Cough: Secondary | ICD-10-CM

## 2018-04-09 DIAGNOSIS — R059 Cough, unspecified: Secondary | ICD-10-CM

## 2018-04-09 DIAGNOSIS — J3489 Other specified disorders of nose and nasal sinuses: Secondary | ICD-10-CM

## 2018-04-09 DIAGNOSIS — K219 Gastro-esophageal reflux disease without esophagitis: Secondary | ICD-10-CM | POA: Diagnosis not present

## 2018-04-09 MED FILL — D-AMPHETAMINE ER 15 MG CAP: 15 | 30 days supply | Qty: 30 | Fill #0

## 2018-04-09 NOTE — Progress Notes (Signed)
I called and left message on patient voicemail to call office schedule 3 month follow up appointment with [facility]. I left message asking patient to ask to speak to [facility] when returning call.

## 2018-04-09 NOTE — Progress Notes (Signed)
Cheryl Ayala    [id number]    [birth year]  Primary Care Physician:Nche, Charlene Brooke, NP  Referring Physician: Flossie Buffy, NP [address] [place], [place] [address]  This service was provided via telemedicine due to [place] 19.  Patient location: Home Provider location: Home Used 2 patient identifiers to confirm the correct person. Explained the limitations in evaluation and management via telemedicine. Patient is aware of possible medical charges for this visit.  Patient consented to this virtual visit (via telephone/webex).   Time spent on call: 14 min  Chief complaint: Cough, mucus  HPI:  33 year old female with history of chronic GERD with complaints of persistent cough and thick mucus for past month. She denies any worsening heartburn, dysphagia or odynophagia.  No abdominal pain, change in bowel habits or rectal bleeding. Cough and regurgitation is worse when she is eating or soon after a meal. She has seasonal allergies with runny nose but this does not feel like seasonal allergies to her.  Denies any sore throat or fever. She is taking omeprazole twice daily with no improvement.  She also took Ney for few days with no improvement. She was advised to take Carafate few days ago and has not noticed any improvement with that either.  Esophageal manometry September 2019: Frequent failed peristalsis with ineffective esophageal motility  EGD February 10, 2018: Hiatal hernia, mild gastritis, H. pylori negative Colonoscopy February 10, 2018: Sigmoid diverticulosis, internal hemorrhoids and diminutive hyperplastic polyp   Outpatient Encounter Medications as of 04/09/2018  Medication Sig  . amLODipine (NORVASC) 5 MG tablet Take 1 tablet (5 mg total) by mouth daily.  . cetirizine (ZYRTEC) 10 MG tablet Take 1 tablet (10 mg total) by mouth daily.  Marland Kitchen dextroamphetamine (DEXEDRINE SPANSULE) 15 MG 24 hr capsule Take 1 capsule (15 mg total) by mouth  daily.  Marland Kitchen labetalol (NORMODYNE) 200 MG tablet Take 1 tablet (200 mg total) by mouth 2 (two) times daily.  Marland Kitchen omeprazole (PRILOSEC) 40 MG capsule Take 1 capsule (40 mg total) by mouth 2 (two) times daily before a meal.  . sucralfate (CARAFATE) 1 GM/10ML suspension Take 10 mLs (1 g total) by mouth 4 (four) times daily.   No facility-administered encounter medications on file as of 04/09/2018.     Allergies as of 04/09/2018 - Review Complete 04/08/2018  Allergen Reaction Noted  . Penicillins Rash 12/12/2016  . Sulfa antibiotics Rash 12/12/2016    Past Medical History:  Diagnosis Date  . Anxiety   . Hypertension   . Plantar fasciitis   . Venous insufficiency     Past Surgical History:  Procedure Laterality Date  . ESOPHAGEAL MANOMETRY N/A 09/25/2017   Procedure: ESOPHAGEAL MANOMETRY (EM);  Surgeon: Mauri Pole, MD;  Location: [facility];  Service: Endoscopy;  Laterality: N/A;  . WISDOM TOOTH EXTRACTION  2013    Family History  Problem Relation Age of Onset  . Cancer Mother   . Hypertension Father   . Hyperlipidemia Father   . Anxiety disorder Father   . Depression Father   . Heart disease Father   . AAA (abdominal aortic aneurysm) Father   . Depression Sister   . Anxiety disorder Sister   . Heart disease Maternal Grandmother   . Alzheimer's disease Maternal Grandmother   . Stroke Maternal Grandmother   . Gout Maternal Grandfather   . Heart disease Paternal Grandfather     Social History   Socioeconomic History  .  Marital status: Married    Spouse name: Not on file  . Number of children: Not on file  . Years of education: Not on file  . Highest education level: Not on file  Occupational History  . Not on file  Social Needs  . Financial resource strain: Not on file  . Food insecurity:    Worry: Not on file    Inability: Not on file  . Transportation needs:    Medical: Not on file    Non-medical: Not on file  Tobacco Use  . Smoking status: Never  Smoker  . Smokeless tobacco: Never Used  Substance and Sexual Activity  . Alcohol use: Yes    Frequency: Never    Comment: once every 6 months   . Drug use: Never  . Sexual activity: Not on file  Lifestyle  . Physical activity:    Days per week: Not on file    Minutes per session: Not on file  . Stress: Not on file  Relationships  . Social connections:    Talks on phone: Not on file    Gets together: Not on file    Attends religious service: Not on file    Active member of club or organization: Not on file    Attends meetings of clubs or organizations: Not on file    Relationship status: Not on file  . Intimate partner violence:    Fear of current or ex partner: Not on file    Emotionally abused: Not on file    Physically abused: Not on file    Forced sexual activity: Not on file  Other Topics Concern  . Not on file  Social History Narrative  . Not on file      Review of systems:  ROS as per HPI otherwise negative.   Physical Exam: Vitals were not taken and physical exam was not performed during this virtual visit.  Data Reviewed:  Reviewed labs, radiology imaging, old records and pertinent past GI work up   Assessment and Plan/Recommendations:  33 year old female with chronic GERD, ineffective esophageal motility with worsening cough, regurgitation and thick mucus back of throat  Her symptoms are atypical for refractory GERD Possible seasonal allergies, sinusitis or upper respiratory infection Advised patient to do a trial of Zyrtec daily for 2 weeks Use cough suppressants/Mucinex as needed  Continue omeprazole twice daily Gaviscon 1 tablet after meals as needed Antireflux measures  Will plan to do 24-hour pH impedance study on PPI to evaluate if has persistent uncontrolled acid reflux or increased nonacid reflux despite therapy. Due to COVID-19, may not be able to schedule the pH study until May 2020  Call back with any change or worsening symptoms.     Damaris Hippo , MD   CC: Nche, Charlene Brooke, NP

## 2018-04-09 NOTE — Telephone Encounter (Signed)
Pt called and stated that she was to be put on nandigam sched today at 3:15pm.. she stated that Dr.Nandigam just called her and she missed the call. She would like a call back and put on the sched.

## 2018-04-09 NOTE — Telephone Encounter (Signed)
Already called back. Thanks

## 2018-04-09 NOTE — Telephone Encounter (Signed)
Pt returned your call for a Virtual Visit. apt time was at 3:15pm today 3-25

## 2018-04-09 NOTE — Patient Instructions (Addendum)
Continue Omeprazole twice daily  Antireflux measures  Stop Carafate  Start Gaviscon 1 tablet after meals as needed three times daily  Start Zyrtec 1 tablet daily X 2 weeks  24 hr pH impedance test on PPI, when able to schedule (likely after May 4th) due to COVID 19. She doesn't need manometry, already had it.     Gastroesophageal Reflux Disease, Adult Gastroesophageal reflux (GER) happens when acid from the stomach flows up into the tube that connects the mouth and the stomach (esophagus). Normally, food travels down the esophagus and stays in the stomach to be digested. However, when a person has GER, food and stomach acid sometimes move back up into the esophagus. If this becomes a more serious problem, the person may be diagnosed with a disease called gastroesophageal reflux disease (GERD). GERD occurs when the reflux:  Happens often.  Causes frequent or severe symptoms.  Causes problems such as damage to the esophagus. When stomach acid comes in contact with the esophagus, the acid may cause soreness (inflammation) in the esophagus. Over time, GERD may create small holes (ulcers) in the lining of the esophagus. What are the causes? This condition is caused by a problem with the muscle between the esophagus and the stomach (lower esophageal sphincter, or LES). Normally, the LES muscle closes after food passes through the esophagus to the stomach. When the LES is weakened or abnormal, it does not close properly, and that allows food and stomach acid to go back up into the esophagus. The LES can be weakened by certain dietary substances, medicines, and medical conditions, including:  Tobacco use.  Pregnancy.  Having a hiatal hernia.  Alcohol use.  Certain foods and beverages, such as coffee, chocolate, onions, and peppermint. What increases the risk? You are more likely to develop this condition if you:  Have an increased body weight.  Have a connective tissue  disorder.  Use NSAID medicines. What are the signs or symptoms? Symptoms of this condition include:  Heartburn.  Difficult or painful swallowing.  The feeling of having a lump in the throat.  Abitter taste in the mouth.  Bad breath.  Having a large amount of saliva.  Having an upset or bloated stomach.  Belching.  Chest pain. Different conditions can cause chest pain. Make sure you see your health care provider if you experience chest pain.  Shortness of breath or wheezing.  Ongoing (chronic) cough or a night-time cough.  Wearing away of tooth enamel.  Weight loss. How is this diagnosed? Your health care provider will take a medical history and perform a physical exam. To determine if you have mild or severe GERD, your health care provider may also monitor how you respond to treatment. You may also have tests, including:  A test to examine your stomach and esophagus with a small camera (endoscopy).  A test thatmeasures the acidity level in your esophagus.  A test thatmeasures how much pressure is on your esophagus.  A barium swallow or modified barium swallow test to show the shape, size, and functioning of your esophagus. How is this treated? The goal of treatment is to help relieve your symptoms and to prevent complications. Treatment for this condition may vary depending on how severe your symptoms are. Your health care provider may recommend:  Changes to your diet.  Medicine.  Surgery. Follow these instructions at home: Eating and drinking   Follow a diet as recommended by your health care provider. This may involve avoiding foods and drinks such  as: ? Coffee and tea (with or without caffeine). ? Drinks that containalcohol. ? Energy drinks and sports drinks. ? Carbonated drinks or sodas. ? Chocolate and cocoa. ? Peppermint and mint flavorings. ? Garlic and onions. ? Horseradish. ? Spicy and acidic foods, including peppers, chili powder, curry  powder, vinegar, hot sauces, and barbecue sauce. ? Citrus fruit juices and citrus fruits, such as oranges, lemons, and limes. ? Tomato-based foods, such as red sauce, chili, salsa, and pizza with red sauce. ? Fried and fatty foods, such as donuts, french fries, potato chips, and high-fat dressings. ? High-fat meats, such as hot dogs and fatty cuts of red and white meats, such as rib eye steak, sausage, ham, and bacon. ? High-fat dairy items, such as whole milk, butter, and cream cheese.  Eat small, frequent meals instead of large meals.  Avoid drinking large amounts of liquid with your meals.  Avoid eating meals during the 2-3 hours before bedtime.  Avoid lying down right after you eat.  Do not exercise right after you eat. Lifestyle   Do not use any products that contain nicotine or tobacco, such as cigarettes, e-cigarettes, and chewing tobacco. If you need help quitting, ask your health care provider.  Try to reduce your stress by using methods such as yoga or meditation. If you need help reducing stress, ask your health care provider.  If you are overweight, reduce your weight to an amount that is healthy for you. Ask your health care provider for guidance about a safe weight loss goal. General instructions  Pay attention to any changes in your symptoms.  Take over-the-counter and prescription medicines only as told by your health care provider. Do not take aspirin, ibuprofen, or other NSAIDs unless your health care provider told you to do so.  Wear loose-fitting clothing. Do not wear anything tight around your waist that causes pressure on your abdomen.  Raise (elevate) the head of your bed about 6 inches (15 cm).  Avoid bending over if this makes your symptoms worse.  Keep all follow-up visits as told by your health care provider. This is important. Contact a health care provider if:  You have: ? New symptoms. ? Unexplained weight loss. ? Difficulty swallowing or it  hurts to swallow. ? Wheezing or a persistent cough. ? A hoarse voice.  Your symptoms do not improve with treatment. Get help right away if you:  Have pain in your arms, neck, jaw, teeth, or back.  Feel sweaty, dizzy, or light-headed.  Have chest pain or shortness of breath.  Vomit and your vomit looks like blood or coffee grounds.  Faint.  Have stool that is bloody or black.  Cannot swallow, drink, or eat. Summary  Gastroesophageal reflux happens when acid from the stomach flows up into the esophagus. GERD is a disease in which the reflux happens often, causes frequent or severe symptoms, or causes problems such as damage to the esophagus.  Treatment for this condition may vary depending on how severe your symptoms are. Your health care provider may recommend diet and lifestyle changes, medicine, or surgery.  Contact a health care provider if you have new or worsening symptoms.  Take over-the-counter and prescription medicines only as told by your health care provider. Do not take aspirin, ibuprofen, or other NSAIDs unless your health care provider told you to do so.  Keep all follow-up visits as told by your health care provider. This is important. This information is not intended to replace advice given to  you by your health care provider. Make sure you discuss any questions you have with your health care provider. Document Released: 10/11/2004 Document Revised: 07/10/2017 Document Reviewed: 07/10/2017 Elsevier Interactive Patient Education  2019 [employer].

## 2018-04-14 ENCOUNTER — Telehealth: Payer: Self-pay | Admitting: *Deleted

## 2018-04-14 NOTE — Telephone Encounter (Signed)
I left Cheryl Ayala a message that we need to reschedule her again.  The director of the surgical center said we must reschedule patients to the week of May 16 or later.  I asked her to call me back tomorrow.

## 2018-04-14 NOTE — Telephone Encounter (Signed)
I left Korina a message that I need to reschedule her appointment that is scheduled for April 29, 2018.  I told her we can reschedule it to the week of May 11, 2018.

## 2018-04-14 NOTE — Telephone Encounter (Signed)
"  I got your message.  Let's reschedule it out to May 5 to be safe."  I'll get it rescheduled.  I rescheduled her surgery from April 29, 2018 to May 20, 2018 via the surgical center's [employer].

## 2018-04-15 MED FILL — AMLODIPINE BESYLATE 5 MG TA: 5 | 90 days supply | Qty: 90 | Fill #0

## 2018-04-15 MED FILL — LABETALOL HCL 200 MG TABS: 200 | 90 days supply | Qty: 180 | Fill #0

## 2018-04-15 MED FILL — OMEPRAZOLE 40 MG CPDR: 40 | 45 days supply | Qty: 90 | Fill #0

## 2018-04-15 NOTE — Telephone Encounter (Signed)
I left Cheryl Ayala a message that I know we spoke yesterday and that we rescheduled her appointment to May 5 but I got a call from the surgery center.  We need to reschedule to the week of [place], 2020.  I told her I was going to schedule her  for May 19.  I asked her to give me a call and let me know if this date is okay.

## 2018-04-16 NOTE — Telephone Encounter (Signed)
"  I got your message, May 19 will be fine."  I'll get it rescheduled.  Be safe.

## 2018-04-18 ENCOUNTER — Encounter: Payer: No Typology Code available for payment source | Admitting: Podiatry

## 2018-04-21 ENCOUNTER — Other Ambulatory Visit: Payer: No Typology Code available for payment source

## 2018-05-01 ENCOUNTER — Encounter: Payer: Self-pay | Admitting: Nurse Practitioner

## 2018-05-05 ENCOUNTER — Telehealth: Payer: Self-pay | Admitting: *Deleted

## 2018-05-05 ENCOUNTER — Other Ambulatory Visit: Payer: No Typology Code available for payment source

## 2018-05-05 NOTE — Telephone Encounter (Signed)
I left Cheryl Ayala a message that we can reschedule her surgery back to 05/20/2018 if she would like.  She's presently scheduled for 06/03/2018.

## 2018-05-06 NOTE — Telephone Encounter (Signed)
"  I got your message.  Go ahead and put me down for May 5.  My boss said it would be okay."  Okay, I'll get it rescheduled to May 20, 2018.  "Thank you so much."  I rescheduled her surgery from 06/03/2018 to 05/20/2018 via the surgical center's [employer] Portal.

## 2018-05-09 ENCOUNTER — Telehealth: Payer: Self-pay | Admitting: *Deleted

## 2018-05-09 ENCOUNTER — Ambulatory Visit: Payer: No Typology Code available for payment source | Admitting: Nurse Practitioner

## 2018-05-09 NOTE — Telephone Encounter (Signed)
I called [facility] and spoke to [place].  I informed her that the patient was previously authorized to have surgery on 04/15/2018.  She was not able to have the surgery due to the [facility] Virus.  I informed her that the patient is scheduled to have surgery on 05/20/2018 now.  She changed the authorization date.  It is now valid from 05/20/2018 to 06/20/2018. The authorization number will remain the same, it is [id number].  The reference number for the call is [id number].

## 2018-05-13 ENCOUNTER — Ambulatory Visit: Payer: No Typology Code available for payment source | Admitting: Nurse Practitioner

## 2018-05-20 ENCOUNTER — Encounter: Payer: Self-pay | Admitting: Podiatry

## 2018-05-20 DIAGNOSIS — M722 Plantar fascial fibromatosis: Secondary | ICD-10-CM

## 2018-05-21 ENCOUNTER — Telehealth: Payer: Self-pay | Admitting: *Deleted

## 2018-05-21 NOTE — Telephone Encounter (Signed)
Called and left a message for the patient to call me back today, I was calling to check on patient after patient had surgery yesterday with Dr Paulla Dolly. Lattie Haw

## 2018-05-22 ENCOUNTER — Telehealth: Payer: Self-pay

## 2018-05-22 NOTE — Telephone Encounter (Signed)
Case is opened for the Ph study at [place]. Appointment has not been made. Left a message for the patient to call us about scheduling the procedure. She is to ask for [facility]. If she calls after today, another nurse or Shirlean Mylar must handle the call.

## 2018-05-24 MED FILL — OMEPRAZOLE 40 MG CPDR: 40 | 30 days supply | Qty: 30 | Fill #0

## 2018-05-26 ENCOUNTER — Ambulatory Visit (INDEPENDENT_AMBULATORY_CARE_PROVIDER_SITE_OTHER): Payer: No Typology Code available for payment source | Admitting: Podiatry

## 2018-05-26 ENCOUNTER — Encounter: Payer: Self-pay | Admitting: Podiatry

## 2018-05-26 ENCOUNTER — Other Ambulatory Visit: Payer: No Typology Code available for payment source

## 2018-05-26 ENCOUNTER — Other Ambulatory Visit: Payer: Self-pay

## 2018-05-26 VITALS — Temp 98.1°F

## 2018-05-26 DIAGNOSIS — M722 Plantar fascial fibromatosis: Secondary | ICD-10-CM

## 2018-05-28 ENCOUNTER — Encounter: Payer: Self-pay | Admitting: Nurse Practitioner

## 2018-05-28 DIAGNOSIS — G4719 Other hypersomnia: Secondary | ICD-10-CM

## 2018-05-28 DIAGNOSIS — F988 Other specified behavioral and emotional disorders with onset usually occurring in childhood and adolescence: Secondary | ICD-10-CM

## 2018-05-28 MED ORDER — DEXTROAMPHETAMINE SULFATE ER 15 MG PO CP24: 15.0000 mg | ORAL_CAPSULE | Freq: Every day | ORAL | 0 refills | Status: DC

## 2018-05-28 NOTE — Progress Notes (Signed)
Subjective:   Patient ID: Cheryl Ayala, female   DOB: 33 y.o.   MRN: [id number]   HPI Patient states my foot is doing really well so far and I have been somewhat active   ROS      Objective:  Physical Exam  Neurovascular status intact patient's right heel is healing well wound edges well coapted negative Homans sign noted with mild arch pain     Assessment:  Overall doing well post endoscopic surgery right heel     Plan:  Reapplied sterile dressing advised to continue boot usage reappoint 3 weeks suture removal or earlier if needed

## 2018-05-29 ENCOUNTER — Telehealth: Payer: Self-pay | Admitting: Nurse Practitioner

## 2018-05-29 DIAGNOSIS — G4719 Other hypersomnia: Secondary | ICD-10-CM

## 2018-05-29 DIAGNOSIS — F988 Other specified behavioral and emotional disorders with onset usually occurring in childhood and adolescence: Secondary | ICD-10-CM

## 2018-05-29 MED ORDER — DEXTROAMPHETAMINE SULFATE ER 15 MG PO CP24: 15.0000 mg | ORAL_CAPSULE | Freq: Every day | ORAL | 0 refills | Status: DC

## 2018-05-29 MED FILL — D-AMPHETAMINE ER 15 MG CAP: 15 | 30 days supply | Qty: 30 | Fill #0

## 2018-05-29 NOTE — Telephone Encounter (Signed)
Copied from [place]. Topic: Quick Communication - See Telephone Encounter >> May 29, 2018 11:16 AM Loma [place] wrote: CRM for notification. See Telephone encounter for: 05/29/18.dextroamphetamine (DEXEDRINE SPANSULE) 15 MG 24 hr capsule Pt said that this was just sent out for her refill to [facility] gentle reminder they are closed and [place] wants this refaxed to them.Marland KitchenMarland Kitchen

## 2018-05-29 NOTE — Telephone Encounter (Signed)
Called [facility] pharmacy and cancel the rx we sent.

## 2018-06-02 ENCOUNTER — Ambulatory Visit (INDEPENDENT_AMBULATORY_CARE_PROVIDER_SITE_OTHER): Payer: No Typology Code available for payment source | Admitting: Podiatry

## 2018-06-02 ENCOUNTER — Encounter (HOSPITAL_COMMUNITY): Admission: RE | Payer: Self-pay | Source: Home / Self Care

## 2018-06-02 ENCOUNTER — Other Ambulatory Visit: Payer: Self-pay

## 2018-06-02 ENCOUNTER — Ambulatory Visit (HOSPITAL_COMMUNITY)
Admission: RE | Admit: 2018-06-02 | Payer: No Typology Code available for payment source | Source: Home / Self Care | Admitting: Gastroenterology

## 2018-06-02 VITALS — Temp 97.7°F

## 2018-06-02 DIAGNOSIS — M722 Plantar fascial fibromatosis: Secondary | ICD-10-CM

## 2018-06-02 SURGERY — MANOMETRY, ESOPHAGUS
Anesthesia: Choice

## 2018-06-04 NOTE — Progress Notes (Signed)
Subjective:   Patient ID: Cheryl Ayala, female   DOB: 33 y.o.   MRN: [id number]   HPI Patient states doing well and here for stitch removal and overall mild discomfort and nothing more than that   ROS      Objective:  Physical Exam  Neurovascular status intact negative Homans sign noted with stitches intact on the right medial and lateral heel     Assessment:  Doing well post endoscopic release right     Plan:  Stitches are removed wound edges coapted well sterile dressings reapplied and advised on continued compression elevation and boot usage with gradual surgical shoe usage and reappoint for [place] to recheck

## 2018-06-11 ENCOUNTER — Other Ambulatory Visit: Payer: No Typology Code available for payment source

## 2018-06-13 ENCOUNTER — Encounter: Payer: Self-pay | Admitting: Podiatry

## 2018-06-16 ENCOUNTER — Encounter: Payer: Self-pay | Admitting: Nurse Practitioner

## 2018-06-16 DIAGNOSIS — Z6841 Body Mass Index (BMI) 40.0 and over, adult: Secondary | ICD-10-CM

## 2018-06-16 DIAGNOSIS — R739 Hyperglycemia, unspecified: Secondary | ICD-10-CM

## 2018-07-08 MED FILL — AMLODIPINE BESYLATE 5 MG TA: 5 | 90 days supply | Qty: 90 | Fill #0

## 2018-07-08 MED FILL — LABETALOL HCL 200 MG TABS: 200 | 90 days supply | Qty: 180 | Fill #1

## 2018-07-09 ENCOUNTER — Encounter: Payer: Self-pay | Admitting: Nurse Practitioner

## 2018-07-09 ENCOUNTER — Other Ambulatory Visit: Payer: Self-pay

## 2018-07-09 ENCOUNTER — Ambulatory Visit (INDEPENDENT_AMBULATORY_CARE_PROVIDER_SITE_OTHER): Payer: No Typology Code available for payment source | Admitting: Nurse Practitioner

## 2018-07-09 VITALS — BP 122/86 | HR 73 | Temp 98.8°F | Ht 67.0 in | Wt 313.2 lb

## 2018-07-09 DIAGNOSIS — F988 Other specified behavioral and emotional disorders with onset usually occurring in childhood and adolescence: Secondary | ICD-10-CM | POA: Diagnosis not present

## 2018-07-09 DIAGNOSIS — F411 Generalized anxiety disorder: Secondary | ICD-10-CM | POA: Diagnosis not present

## 2018-07-09 DIAGNOSIS — I10 Essential (primary) hypertension: Secondary | ICD-10-CM | POA: Diagnosis not present

## 2018-07-09 DIAGNOSIS — G4719 Other hypersomnia: Secondary | ICD-10-CM

## 2018-07-09 DIAGNOSIS — F909 Attention-deficit hyperactivity disorder, unspecified type: Secondary | ICD-10-CM

## 2018-07-09 DIAGNOSIS — F325 Major depressive disorder, single episode, in full remission: Secondary | ICD-10-CM | POA: Diagnosis not present

## 2018-07-09 MED ORDER — LABETALOL HCL 200 MG PO TABS: 200.0000 mg | ORAL_TABLET | Freq: Two times a day (BID) | ORAL | 1 refills | Status: DC

## 2018-07-09 MED ORDER — DEXTROAMPHETAMINE SULFATE ER 15 MG PO CP24: 15.0000 mg | ORAL_CAPSULE | Freq: Every day | ORAL | 0 refills | Status: DC

## 2018-07-09 MED FILL — D-AMPHETAMINE ER 15 MG CAP: 15 | 30 days supply | Qty: 30 | Fill #0

## 2018-07-09 NOTE — Progress Notes (Signed)
Subjective:  Patient ID: Cheryl Ayala, female    DOB: [birth year]  Age: 33 y.o. MRN: [id number]  CC: Medication Refill (med refills/not fasting today/ denied tdap)  HPI  Obesity: Continuous weight gain. Unable to exercise due to recent foot surgery and pain. Trying to make changes to diet at this time. Did not contact weight loss clinic for appt.  HTN: Stable with labetalol and amlodipine. BP Readings from Last 3 Encounters:  07/09/18 122/86  04/08/18 130/86  02/10/18 115/68   ADHD and Narcolepsy: PMP database reviewed, no red flags. Last refill 05/29/2018. Persistent daytime fatigue despite use of dexedrine and scheduled naps during her break. She attribute it to working 2jobs.   Anxiety and depression: No longer wants to talk lexapro, has counseling sessions at [facility]. Depression screen [facility] 2/9 07/09/2018 04/08/2018 02/07/2018  Decreased Interest 1 0 0  Down, Depressed, Hopeless 0 0 0  PHQ - 2 Score 1 0 0  Altered sleeping 0 - -  Tired, decreased energy 3 - -  Change in appetite 0 - -  Feeling bad or failure about yourself  0 - -  Trouble concentrating 0 - -  Moving slowly or fidgety/restless 0 - -  Suicidal thoughts 0 - -  PHQ-9 Score 4 - -   GAD 7 : Generalized Anxiety Score 07/09/2018  Nervous, Anxious, on Edge 2  Control/stop worrying 2  Worry too much - different things 2  Trouble relaxing 1  Restless 0  Easily annoyed or irritable 2  Afraid - awful might happen 0  Total GAD 7 Score 9   Reviewed past Medical, Social and Family history today.  Outpatient Medications Prior to Visit  Medication Sig Dispense Refill  . amLODipine (NORVASC) 5 MG tablet Take 1 tablet (5 mg total) by mouth daily. 90 tablet 3  . omeprazole (PRILOSEC) 40 MG capsule Take 1 capsule (40 mg total) by mouth 2 (two) times daily before a meal. 90 capsule 3  . dextroamphetamine (DEXEDRINE SPANSULE) 15 MG 24 hr capsule Take 1 capsule (15 mg total) by mouth daily. 30 capsule 0  .  labetalol (NORMODYNE) 200 MG tablet Take 1 tablet (200 mg total) by mouth 2 (two) times daily. 180 tablet 1  . cetirizine (ZYRTEC) 10 MG tablet Take 1 tablet (10 mg total) by mouth daily. (Patient not taking: Reported on 07/09/2018) 30 tablet 0  . sucralfate (CARAFATE) 1 GM/10ML suspension Take 10 mLs (1 g total) by mouth 4 (four) times daily. (Patient not taking: Reported on 07/09/2018) 420 mL 3   No facility-administered medications prior to visit.     ROS See HPI  Objective:  BP 122/86   Pulse 73   Temp 98.8 F (37.1 C) (Oral)   Ht 5\' 7"  (1.702 m)   Wt (!) 313 lb 3.2 oz (142.1 kg)   SpO2 97%   BMI 49.05 kg/m   BP Readings from Last 3 Encounters:  07/09/18 122/86  04/08/18 130/86  02/10/18 115/68   Wt Readings from Last 3 Encounters:  07/09/18 (!) 313 lb 3.2 oz (142.1 kg)  04/08/18 (!) 306 lb (138.8 kg)  02/10/18 (!) 311 lb (141.1 kg)   Physical Exam Vitals signs reviewed.  Cardiovascular:     Rate and Rhythm: Normal rate and regular rhythm.     Pulses: Normal pulses.     Heart sounds: Normal heart sounds.  Pulmonary:     Effort: Pulmonary effort is normal.     Breath sounds: Normal breath sounds.  Musculoskeletal:     Right lower leg: Edema present.     Left lower leg: Edema present.     Comments: Chronic LE edema  Skin:    Findings: No erythema.  Neurological:     Mental Status: She is alert and oriented to person, place, and time.    Lab Results  Component Value Date   WBC 9.7 02/07/2018   HGB 12.2 02/07/2018   HCT 36.9 02/07/2018   PLT 436.0 (H) 02/07/2018   GLUCOSE 110 (H) 02/07/2018   CHOL 203 (H) 02/07/2018   TRIG 56.0 02/07/2018   HDL 52.30 02/07/2018   LDLCALC 140 (H) 02/07/2018   ALT 14 02/07/2018   AST 15 02/07/2018   NA 140 02/07/2018   K 3.7 02/07/2018   CL 104 02/07/2018   CREATININE 0.85 02/07/2018   BUN 16 02/07/2018   CO2 27 02/07/2018   TSH 2.63 02/07/2018   HGBA1C 6.1 02/07/2018    Assessment & Plan:   Charmika was seen  today for medication refill.  Diagnoses and all orders for this visit:  Generalized anxiety disorder  Major depressive disorder with single episode, in full remission ([place])  Benign essential hypertension -     labetalol (NORMODYNE) 200 MG tablet; Take 1 tablet (200 mg total) by mouth 2 (two) times daily.  Attention deficit disorder (ADD) without hyperactivity -     dextroamphetamine (DEXEDRINE SPANSULE) 15 MG 24 hr capsule; Take 1 capsule (15 mg total) by mouth daily.  Excessive daytime sleepiness -     dextroamphetamine (DEXEDRINE SPANSULE) 15 MG 24 hr capsule; Take 1 capsule (15 mg total) by mouth daily.  Adult ADHD   I have discontinued Modesta L. Kleier's sucralfate and cetirizine. I am also having her maintain her omeprazole, amLODipine, dextroamphetamine, and labetalol.  Meds ordered this encounter  Medications  . dextroamphetamine (DEXEDRINE SPANSULE) 15 MG 24 hr capsule    Sig: Take 1 capsule (15 mg total) by mouth daily.    Dispense:  30 capsule    Refill:  0    Patient request to pick up medication. Do not mail prescription    Order Specific Question:   Supervising Provider    Answer:   Lucille Passy [[id number]]  . labetalol (NORMODYNE) 200 MG tablet    Sig: Take 1 tablet (200 mg total) by mouth 2 (two) times daily.    Dispense:  180 tablet    Refill:  1    Order Specific Question:   Supervising Provider    Answer:   Lucille Passy [[id number]]    Problem List Items Addressed This Visit      Cardiovascular and Mediastinum   Benign essential hypertension   Relevant Medications   labetalol (NORMODYNE) 200 MG tablet     Other   ADD (attention deficit disorder)   Relevant Medications   dextroamphetamine (DEXEDRINE SPANSULE) 15 MG 24 hr capsule   Adult ADHD   Depression   Excessive daytime sleepiness   Relevant Medications   dextroamphetamine (DEXEDRINE SPANSULE) 15 MG 24 hr capsule   Generalized anxiety disorder - Primary      Follow-up: Return in about 3 months  (around 10/09/2018) for HTN and ADHD (video appt).  Wilfred Lacy, NP

## 2018-07-09 NOTE — Patient Instructions (Addendum)
Continue current medications. Schedule 46month f/up video appt and 6month F2F appt for CPE (fasting).  Continue to work of diet (small portions, low fat/low carb) while foot is healing. Make appt with weight loss clinic to help coach with diet changes.

## 2018-08-08 ENCOUNTER — Encounter: Payer: Self-pay | Admitting: Nurse Practitioner

## 2018-08-08 DIAGNOSIS — G4719 Other hypersomnia: Secondary | ICD-10-CM

## 2018-08-08 DIAGNOSIS — F988 Other specified behavioral and emotional disorders with onset usually occurring in childhood and adolescence: Secondary | ICD-10-CM

## 2018-08-08 MED ORDER — DEXTROAMPHETAMINE SULFATE ER 15 MG PO CP24: 15.0000 mg | ORAL_CAPSULE | Freq: Every day | ORAL | 0 refills | Status: DC

## 2018-08-08 MED FILL — D-AMPHETAMINE ER 15 MG CAP: 15 | 30 days supply | Qty: 30 | Fill #0

## 2018-08-18 MED FILL — OMEPRAZOLE DR 40 MG CAPSULE: 40 | 30 days supply | Qty: 30 | Fill #0

## 2018-08-28 MED FILL — OMEPRAZOLE DR 40 MG CAPSULE: 40 | 30 days supply | Qty: 30 | Fill #0

## 2018-09-18 ENCOUNTER — Encounter: Payer: Self-pay | Admitting: Nurse Practitioner

## 2018-09-18 DIAGNOSIS — F988 Other specified behavioral and emotional disorders with onset usually occurring in childhood and adolescence: Secondary | ICD-10-CM

## 2018-09-18 DIAGNOSIS — G4719 Other hypersomnia: Secondary | ICD-10-CM

## 2018-09-18 MED ORDER — DEXTROAMPHETAMINE SULFATE ER 15 MG PO CP24: 15.0000 mg | ORAL_CAPSULE | Freq: Every day | ORAL | 0 refills | Status: DC

## 2018-09-18 MED FILL — D-AMPHETAMINE ER 15 MG CAP: 15 | 30 days supply | Qty: 30 | Fill #0

## 2018-10-07 ENCOUNTER — Other Ambulatory Visit: Payer: Self-pay

## 2018-10-07 ENCOUNTER — Telehealth: Payer: No Typology Code available for payment source | Admitting: Nurse Practitioner

## 2018-10-14 ENCOUNTER — Other Ambulatory Visit: Payer: Self-pay | Admitting: *Deleted

## 2018-10-14 MED ORDER — OMEPRAZOLE 40 MG PO CPDR: 40.0000 mg | DELAYED_RELEASE_CAPSULE | Freq: Two times a day (BID) | ORAL | 3 refills | Status: DC

## 2018-10-20 ENCOUNTER — Encounter: Payer: Self-pay | Admitting: Nurse Practitioner

## 2018-10-20 DIAGNOSIS — F988 Other specified behavioral and emotional disorders with onset usually occurring in childhood and adolescence: Secondary | ICD-10-CM

## 2018-10-20 DIAGNOSIS — G4719 Other hypersomnia: Secondary | ICD-10-CM

## 2018-10-20 MED ORDER — DEXTROAMPHETAMINE SULFATE ER 15 MG PO CP24: 15.0000 mg | ORAL_CAPSULE | Freq: Every day | ORAL | 0 refills | Status: DC

## 2018-10-20 MED FILL — D-AMPHETAMINE ER 15 MG CAP: 15 | 30 days supply | Qty: 30 | Fill #0

## 2018-11-17 ENCOUNTER — Encounter: Payer: Self-pay | Admitting: Nurse Practitioner

## 2018-11-19 ENCOUNTER — Ambulatory Visit: Payer: No Typology Code available for payment source | Admitting: Nurse Practitioner

## 2018-11-20 MED FILL — LABETALOL HCL 200 MG TABLET: 200 | 90 days supply | Qty: 180 | Fill #0

## 2018-11-20 MED FILL — AMLODIPINE BESYLATE 5 MG TA: 5 | 90 days supply | Qty: 90 | Fill #1

## 2018-11-21 ENCOUNTER — Ambulatory Visit (INDEPENDENT_AMBULATORY_CARE_PROVIDER_SITE_OTHER): Payer: No Typology Code available for payment source | Admitting: Nurse Practitioner

## 2018-11-21 ENCOUNTER — Encounter: Payer: Self-pay | Admitting: Nurse Practitioner

## 2018-11-21 ENCOUNTER — Other Ambulatory Visit: Payer: Self-pay

## 2018-11-21 VITALS — BP 120/88 | HR 81 | Temp 97.3°F | Ht 67.0 in | Wt 312.2 lb

## 2018-11-21 DIAGNOSIS — Z79899 Other long term (current) drug therapy: Secondary | ICD-10-CM

## 2018-11-21 DIAGNOSIS — F909 Attention-deficit hyperactivity disorder, unspecified type: Secondary | ICD-10-CM

## 2018-11-21 DIAGNOSIS — G4719 Other hypersomnia: Secondary | ICD-10-CM

## 2018-11-21 DIAGNOSIS — I10 Essential (primary) hypertension: Secondary | ICD-10-CM

## 2018-11-21 DIAGNOSIS — E782 Mixed hyperlipidemia: Secondary | ICD-10-CM | POA: Diagnosis not present

## 2018-11-21 DIAGNOSIS — R739 Hyperglycemia, unspecified: Secondary | ICD-10-CM | POA: Diagnosis not present

## 2018-11-21 MED ORDER — DEXTROAMPHETAMINE SULFATE ER 15 MG PO CP24: 15.0000 mg | ORAL_CAPSULE | Freq: Every day | ORAL | 0 refills | Status: DC

## 2018-11-21 MED FILL — D-AMPHETAMINE ER 15 MG CAP: 15 | 30 days supply | Qty: 30 | Fill #0

## 2018-11-21 NOTE — Patient Instructions (Addendum)
Go to [facility] for blood draw next week. Need to be fasting at least 6hrs prior to blood draw.  Make appt with nutritionist as discussed.  Continue current medications.  No change on ECG  DASH Eating Plan DASH stands for "Dietary Approaches to Stop Hypertension." The DASH eating plan is a healthy eating plan that has been shown to reduce high blood pressure (hypertension). It may also reduce your risk for type 2 diabetes, heart disease, and stroke. The DASH eating plan may also help with weight loss. What are tips for following this plan?  General guidelines  Avoid eating more than 2,300 mg (milligrams) of salt (sodium) a day. If you have hypertension, you may need to reduce your sodium intake to 1,500 mg a day.  Limit alcohol intake to no more than 1 drink a day for nonpregnant women and 2 drinks a day for men. One drink equals 12 oz of beer, 5 oz of wine, or 1 oz of hard liquor.  Work with your health care provider to maintain a healthy body weight or to lose weight. Ask what an ideal weight is for you.  Get at least 30 minutes of exercise that causes your heart to beat faster (aerobic exercise) most days of the week. Activities may include walking, swimming, or biking.  Work with your health care provider or diet and nutrition specialist (dietitian) to adjust your eating plan to your individual calorie needs. Reading food labels   Check food labels for the amount of sodium per serving. Choose foods with less than 5 percent of the Daily Value of sodium. Generally, foods with less than 300 mg of sodium per serving fit into this eating plan.  To find whole grains, look for the word "whole" as the first word in the ingredient list. Shopping  Buy products labeled as "low-sodium" or "no salt added."  Buy fresh foods. Avoid canned foods and premade or frozen meals. Cooking  Avoid adding salt when cooking. Use salt-free seasonings or herbs instead of table salt or sea salt. Check with  your health care provider or pharmacist before using salt substitutes.  Do not fry foods. Cook foods using healthy methods such as baking, boiling, grilling, and broiling instead.  Cook with heart-healthy oils, such as olive, canola, soybean, or sunflower oil. Meal planning  Eat a balanced diet that includes: ? 5 or more servings of fruits and vegetables each day. At each meal, try to fill half of your plate with fruits and vegetables. ? Up to 6-8 servings of whole grains each day. ? Less than 6 oz of lean meat, poultry, or fish each day. A 3-oz serving of meat is about the same size as a deck of cards. One egg equals 1 oz. ? 2 servings of low-fat dairy each day. ? A serving of nuts, seeds, or beans 5 times each week. ? Heart-healthy fats. Healthy fats called Omega-3 fatty acids are found in foods such as flaxseeds and coldwater fish, like sardines, salmon, and mackerel.  Limit how much you eat of the following: ? Canned or prepackaged foods. ? Food that is high in trans fat, such as fried foods. ? Food that is high in saturated fat, such as fatty meat. ? Sweets, desserts, sugary drinks, and other foods with added sugar. ? Full-fat dairy products.  Do not salt foods before eating.  Try to eat at least 2 vegetarian meals each week.  Eat more home-cooked food and less restaurant, buffet, and fast food.  When eating at a restaurant, ask that your food be prepared with less salt or no salt, if possible. What foods are recommended? The items listed may not be a complete list. Talk with your dietitian about what dietary choices are best for you. Grains Whole-grain or whole-wheat bread. Whole-grain or whole-wheat pasta. Brown rice. Modena Morrow. Bulgur. Whole-grain and low-sodium cereals. Pita bread. Low-fat, low-sodium crackers. Whole-wheat flour tortillas. Vegetables Fresh or frozen vegetables (raw, steamed, roasted, or grilled). Low-sodium or reduced-sodium tomato and vegetable  juice. Low-sodium or reduced-sodium tomato sauce and tomato paste. Low-sodium or reduced-sodium canned vegetables. Fruits All fresh, dried, or frozen fruit. Canned fruit in natural juice (without added sugar). Meat and other protein foods Skinless chicken or [place]. Ground chicken or [place]. Pork with fat trimmed off. Fish and seafood. Egg whites. Dried beans, peas, or lentils. Unsalted nuts, nut butters, and seeds. Unsalted canned beans. Lean cuts of beef with fat trimmed off. Low-sodium, lean deli meat. Dairy Low-fat (1%) or fat-free (skim) milk. Fat-free, low-fat, or reduced-fat cheeses. Nonfat, low-sodium ricotta or cottage cheese. Low-fat or nonfat yogurt. Low-fat, low-sodium cheese. Fats and oils Soft margarine without trans fats. Vegetable oil. Low-fat, reduced-fat, or light mayonnaise and salad dressings (reduced-sodium). Canola, safflower, olive, soybean, and sunflower oils. Avocado. Seasoning and other foods Herbs. Spices. Seasoning mixes without salt. Unsalted popcorn and pretzels. Fat-free sweets. What foods are not recommended? The items listed may not be a complete list. Talk with your dietitian about what dietary choices are best for you. Grains Baked goods made with fat, such as croissants, muffins, or some breads. Dry pasta or rice meal packs. Vegetables Creamed or fried vegetables. Vegetables in a cheese sauce. Regular canned vegetables (not low-sodium or reduced-sodium). Regular canned tomato sauce and paste (not low-sodium or reduced-sodium). Regular tomato and vegetable juice (not low-sodium or reduced-sodium). Angie Fava. Olives. Fruits Canned fruit in a light or heavy syrup. Fried fruit. Fruit in cream or butter sauce. Meat and other protein foods Fatty cuts of meat. Ribs. Fried meat. Berniece Salines. Sausage. Bologna and other processed lunch meats. Salami. Fatback. Hotdogs. Bratwurst. Salted nuts and seeds. Canned beans with added salt. Canned or smoked fish. Whole eggs or egg yolks.  Chicken or [place] with skin. Dairy Whole or 2% milk, cream, and half-and-half. Whole or full-fat cream cheese. Whole-fat or sweetened yogurt. Full-fat cheese. Nondairy creamers. Whipped toppings. Processed cheese and cheese spreads. Fats and oils Butter. Stick margarine. Lard. Shortening. Ghee. Bacon fat. Tropical oils, such as coconut, palm kernel, or palm oil. Seasoning and other foods Salted popcorn and pretzels. Onion salt, garlic salt, seasoned salt, table salt, and sea salt. Worcestershire sauce. Tartar sauce. Barbecue sauce. Teriyaki sauce. Soy sauce, including reduced-sodium. Steak sauce. Canned and packaged gravies. Fish sauce. Oyster sauce. Cocktail sauce. Horseradish that you find on the shelf. Ketchup. Mustard. Meat flavorings and tenderizers. Bouillon cubes. Hot sauce and Tabasco sauce. Premade or packaged marinades. Premade or packaged taco seasonings. Relishes. Regular salad dressings. Where to find more information:  National Heart, Lung, and [place]: https://wilson-eaton.com/  American Heart Association: www.heart.org Summary  The DASH eating plan is a healthy eating plan that has been shown to reduce high blood pressure (hypertension). It may also reduce your risk for type 2 diabetes, heart disease, and stroke.  With the DASH eating plan, you should limit salt (sodium) intake to 2,300 mg a day. If you have hypertension, you may need to reduce your sodium intake to 1,500 mg a day.  When on the DASH eating plan,  aim to eat more fresh fruits and vegetables, whole grains, lean proteins, low-fat dairy, and heart-healthy fats.  Work with your health care provider or diet and nutrition specialist (dietitian) to adjust your eating plan to your individual calorie needs. This information is not intended to replace advice given to you by your health care provider. Make sure you discuss any questions you have with your health care provider. Document Released: 12/21/2010 Document Revised:  12/14/2016 Document Reviewed: 12/26/2015 Elsevier Patient Education  2020 [employer].

## 2018-11-21 NOTE — Progress Notes (Signed)
Subjective:  Patient ID: Cheryl Ayala, female    DOB: 08-16-1985  Age: 33 y.o. MRN: [id number]  CC: Follow-up (med refills/)  HPI HTN: Stable with amlodipine and labetalol. BP Readings from Last 3 Encounters:  11/21/18 120/88  07/09/18 122/86  04/08/18 130/86   ADHD and hypersomnolence: Stable with use of dexedrine, denies any mood swings or change in appetite. Wt Readings from Last 3 Encounters:  11/21/18 (!) 312 lb 3.2 oz (141.6 kg)  07/09/18 (!) 313 lb 3.2 oz (142.1 kg)  04/08/18 (!) 306 lb (138.8 kg)   Reviewed past Medical, Social and Family history today.  Outpatient Medications Prior to Visit  Medication Sig Dispense Refill  . amLODipine (NORVASC) 5 MG tablet Take 1 tablet (5 mg total) by mouth daily. 90 tablet 3  . labetalol (NORMODYNE) 200 MG tablet Take 1 tablet (200 mg total) by mouth 2 (two) times daily. 180 tablet 1  . omeprazole (PRILOSEC) 40 MG capsule Take 1 capsule (40 mg total) by mouth 2 (two) times daily before a meal. 90 capsule 3  . dextroamphetamine (DEXEDRINE SPANSULE) 15 MG 24 hr capsule Take 1 capsule (15 mg total) by mouth daily. 30 capsule 0   No facility-administered medications prior to visit.     ROS See HPI  Objective:  BP 120/88   Pulse 81   Temp (!) 97.3 F (36.3 C) (Tympanic)   Ht 5\' 7"  (1.702 m)   Wt (!) 312 lb 3.2 oz (141.6 kg)   SpO2 96%   BMI 48.90 kg/m   BP Readings from Last 3 Encounters:  11/21/18 120/88  07/09/18 122/86  04/08/18 130/86    Wt Readings from Last 3 Encounters:  11/21/18 (!) 312 lb 3.2 oz (141.6 kg)  07/09/18 (!) 313 lb 3.2 oz (142.1 kg)  04/08/18 (!) 306 lb (138.8 kg)   ECG: normal sinus rhythm, no change compared to 06/2017 tracing.  Physical Exam Constitutional:      Appearance: She is obese.  Cardiovascular:     Rate and Rhythm: Normal rate and regular rhythm.     Pulses: Normal pulses.     Heart sounds: Normal heart sounds.  Pulmonary:     Effort: Pulmonary effort is normal.   Breath sounds: Normal breath sounds.  Musculoskeletal:     Right lower leg: Edema present.     Left lower leg: Edema present.  Neurological:     Mental Status: She is alert and oriented to person, place, and time.  Psychiatric:        Mood and Affect: Mood normal.        Behavior: Behavior normal.        Thought Content: Thought content normal.     Lab Results  Component Value Date   WBC 9.7 02/07/2018   HGB 12.2 02/07/2018   HCT 36.9 02/07/2018   PLT 436.0 (H) 02/07/2018   GLUCOSE 110 (H) 02/07/2018   CHOL 203 (H) 02/07/2018   TRIG 56.0 02/07/2018   HDL 52.30 02/07/2018   LDLCALC 140 (H) 02/07/2018   ALT 14 02/07/2018   AST 15 02/07/2018   NA 140 02/07/2018   K 3.7 02/07/2018   CL 104 02/07/2018   CREATININE 0.85 02/07/2018   BUN 16 02/07/2018   CO2 27 02/07/2018   TSH 2.63 02/07/2018   HGBA1C 6.1 02/07/2018    Assessment & Plan:   Gwendloyn was seen today for follow-up.  Diagnoses and all orders for this visit:  Encounter for medication management in  attention deficit hyperactivity disorder (ADHD) -     EKG 12-Lead -     dextroamphetamine (DEXEDRINE SPANSULE) 15 MG 24 hr capsule; Take 1 capsule (15 mg total) by mouth daily.  Essential hypertension -     Cancel: HTN_1 BMP -     EKG 12-Lead -     HTN_1 BMP; Future  Hyperglycemia -     Cancel: Hemoglobin A1c -     HTN_1 BMP; Future -     DM_1 Hemoglobin A1c; Future  Mixed hyperlipidemia -     HTN_4 Lipid panel; Future  Excessive daytime sleepiness -     HTN_1 BMP; Future -     dextroamphetamine (DEXEDRINE SPANSULE) 15 MG 24 hr capsule; Take 1 capsule (15 mg total) by mouth daily.  Adult ADHD -     HTN_4 Lipid panel; Future -     dextroamphetamine (DEXEDRINE SPANSULE) 15 MG 24 hr capsule; Take 1 capsule (15 mg total) by mouth daily.  Benign essential hypertension   I am having Cheryl Ayala maintain her amLODipine, labetalol, omeprazole, and dextroamphetamine.  Meds ordered this encounter   Medications  . dextroamphetamine (DEXEDRINE SPANSULE) 15 MG 24 hr capsule    Sig: Take 1 capsule (15 mg total) by mouth daily.    Dispense:  30 capsule    Refill:  0    Order Specific Question:   Supervising Provider    Answer:   Lucille Passy [[id number]]    Problem List Items Addressed This Visit      Cardiovascular and Mediastinum   Benign essential hypertension    Stable BP with amlodipine and labetalol Persistent LE edema. Normal ECG Has not made any changes to diet nor has she made appt with nutritionist as disdussed Advised about the importance for weight loss through diet and exercise. F/up in 20months        Other   Adult ADHD   Relevant Medications   dextroamphetamine (DEXEDRINE SPANSULE) 15 MG 24 hr capsule   Other Relevant Orders   HTN_4 Lipid panel   Excessive daytime sleepiness   Relevant Medications   dextroamphetamine (DEXEDRINE SPANSULE) 15 MG 24 hr capsule   Other Relevant Orders   HTN_1 BMP   Hyperglycemia   Relevant Orders   HTN_1 BMP   DM_1 Hemoglobin A1c   Mixed hyperlipidemia    Has not made any changes to diet nor has she made appt with nutritionist as disdussed Advised about the importance for weight loss through diet and exercise.       Relevant Orders   HTN_4 Lipid panel    Other Visit Diagnoses    Encounter for medication management in attention deficit hyperactivity disorder (ADHD)    -  Primary   Relevant Medications   dextroamphetamine (DEXEDRINE SPANSULE) 15 MG 24 hr capsule   Other Relevant Orders   EKG 12-Lead (Completed)   Essential hypertension       Relevant Orders   EKG 12-Lead (Completed)   HTN_1 BMP      Follow-up: Return in about 3 months (around 02/21/2019) for HTN and ADHD (virtual appt).  Wilfred Lacy, NP

## 2018-11-22 DIAGNOSIS — E1169 Type 2 diabetes mellitus with other specified complication: Secondary | ICD-10-CM | POA: Insufficient documentation

## 2018-11-22 DIAGNOSIS — E782 Mixed hyperlipidemia: Secondary | ICD-10-CM | POA: Insufficient documentation

## 2018-11-22 NOTE — Assessment & Plan Note (Addendum)
Stable BP with amlodipine and labetalol Persistent LE edema. Normal ECG Has not made any changes to diet nor has she made appt with nutritionist as disdussed Advised about the importance for weight loss through diet and exercise. F/up in 8months

## 2018-11-22 NOTE — Assessment & Plan Note (Signed)
>>  ASSESSMENT AND PLAN FOR BENIGN ESSENTIAL HYPERTENSION WRITTEN ON 11/22/2018 11:41 AM BY Zayaan Kozak LUM, NP  Stable BP with amlodipine and labetalol Persistent LE edema. Normal ECG Has not made any changes to diet nor has she made appt with nutritionist as disdussed Advised about the importance for weight loss through diet and exercise. F/up in 26months

## 2018-11-22 NOTE — Assessment & Plan Note (Signed)
Has not made any changes to diet nor has she made appt with nutritionist as disdussed Advised about the importance for weight loss through diet and exercise.

## 2018-12-29 ENCOUNTER — Encounter: Payer: Self-pay | Admitting: Nurse Practitioner

## 2018-12-29 DIAGNOSIS — F909 Attention-deficit hyperactivity disorder, unspecified type: Secondary | ICD-10-CM

## 2018-12-29 DIAGNOSIS — G4719 Other hypersomnia: Secondary | ICD-10-CM

## 2018-12-29 DIAGNOSIS — Z79899 Other long term (current) drug therapy: Secondary | ICD-10-CM

## 2018-12-30 MED ORDER — DEXTROAMPHETAMINE SULFATE ER 15 MG PO CP24: 15.0000 mg | ORAL_CAPSULE | Freq: Every day | ORAL | 0 refills | Status: DC

## 2018-12-30 MED FILL — D-AMPHETAMINE ER 15 MG CAP: 15 | 30 days supply | Qty: 30 | Fill #0

## 2019-01-08 ENCOUNTER — Encounter: Payer: Self-pay | Admitting: Podiatry

## 2019-01-13 ENCOUNTER — Telehealth: Payer: Self-pay | Admitting: *Deleted

## 2019-01-13 DIAGNOSIS — R195 Other fecal abnormalities: Secondary | ICD-10-CM

## 2019-01-13 DIAGNOSIS — R9389 Abnormal findings on diagnostic imaging of other specified body structures: Secondary | ICD-10-CM

## 2019-01-13 DIAGNOSIS — Z8719 Personal history of other diseases of the digestive system: Secondary | ICD-10-CM

## 2019-01-13 NOTE — Telephone Encounter (Signed)
Please order limited RUQ ultrasound and LFT. Thanks

## 2019-01-13 NOTE — Telephone Encounter (Signed)
Patient wants to have a Ultrasound scheduled because she is past due for a follow up US.    She is not having any pain, but is having yellow stools, and has a hx of gallbladder stones  Last [place] was June 2019

## 2019-01-13 NOTE — Telephone Encounter (Signed)
You have been scheduled for an abdominal ultrasound at [facility] (1st floor of hospital) on 01/21/2019 at 7:45am. Please arrive 15 minutes prior to your appointment for registration. Make certain not to have anything to eat or drink 6 hours prior to your appointment. Should you need to reschedule your appointment, please contact radiology at [phone]. This test typically takes about 30 minutes to perform.   Lab orders are in for LFT's

## 2019-01-14 ENCOUNTER — Other Ambulatory Visit: Payer: No Typology Code available for payment source

## 2019-01-14 DIAGNOSIS — R195 Other fecal abnormalities: Secondary | ICD-10-CM

## 2019-01-14 DIAGNOSIS — Z8719 Personal history of other diseases of the digestive system: Secondary | ICD-10-CM

## 2019-01-14 DIAGNOSIS — R9389 Abnormal findings on diagnostic imaging of other specified body structures: Secondary | ICD-10-CM

## 2019-01-14 LAB — HEPATIC FUNCTION PANEL
ALT: 12 U/L (ref 0–35)
AST: 13 U/L (ref 0–37)
Albumin: 3.9 g/dL (ref 3.5–5.2)
Alkaline Phosphatase: 45 U/L (ref 39–117)
Bilirubin, Direct: 0.1 mg/dL (ref 0.0–0.3)
Total Bilirubin: 0.3 mg/dL (ref 0.2–1.2)
Total Protein: 6.7 g/dL (ref 6.0–8.3)

## 2019-01-21 ENCOUNTER — Ambulatory Visit (HOSPITAL_COMMUNITY)
Admission: RE | Admit: 2019-01-21 | Discharge: 2019-01-21 | Disposition: A | Discharge status: Home / Self Care | Payer: No Typology Code available for payment source | Source: Ambulatory Visit | Attending: Gastroenterology | Admitting: Gastroenterology

## 2019-01-21 ENCOUNTER — Other Ambulatory Visit: Payer: Self-pay

## 2019-01-21 DIAGNOSIS — Z8719 Personal history of other diseases of the digestive system: Secondary | ICD-10-CM | POA: Insufficient documentation

## 2019-01-21 DIAGNOSIS — R195 Other fecal abnormalities: Secondary | ICD-10-CM | POA: Diagnosis present

## 2019-01-21 DIAGNOSIS — R9389 Abnormal findings on diagnostic imaging of other specified body structures: Secondary | ICD-10-CM | POA: Diagnosis present

## 2019-01-28 ENCOUNTER — Encounter: Payer: Self-pay | Admitting: Nurse Practitioner

## 2019-01-28 DIAGNOSIS — K8000 Calculus of gallbladder with acute cholecystitis without obstruction: Secondary | ICD-10-CM

## 2019-01-28 DIAGNOSIS — R1013 Epigastric pain: Secondary | ICD-10-CM

## 2019-01-29 ENCOUNTER — Ambulatory Visit: Payer: Self-pay | Admitting: Surgery

## 2019-01-29 NOTE — H&P (Signed)
Surgical H&P Requesting provider: Wilfred Lacy NP  CC: biliary colic  HPI: Very pleasant 34 year old woman with history of symptomatic cholelithiasis. She has known gallstones measuring up to 2.3 cm. She did have an episode of epigastric and subxiphoid pain about a year ago and was evaluated by my partner but she did not pursue surgery at that time. She has not had significant pain, denies postprandial nausea however has terrible reflux and indigestion. 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 She had a follow-up ultrasound last week but confirms cholelithiasis as above, and she states that since that time she has had more tenderness in the right upper quadrant. Denies prior abdominal surgery.   Allergies  Allergen Reactions  . Penicillins Rash    Has patient had a PCN reaction causing immediate rash, facial/tongue/throat swelling, SOB or lightheadedness with hypotension: Yes Has patient had a PCN reaction causing severe rash involving mucus membranes or skin necrosis: No Has patient had a PCN reaction that required hospitalization: No Has patient had a PCN reaction occurring within the last 10 years: No If all of the above answers are "NO", then may proceed with Cephalosporin use.   . Sulfa Antibiotics Rash    Past Medical History:  Diagnosis Date  . Anxiety   . Hypertension   . Plantar fasciitis   . Venous insufficiency     Past Surgical History:  Procedure Laterality Date  . ESOPHAGEAL MANOMETRY N/A 09/25/2017   Procedure: ESOPHAGEAL MANOMETRY (EM);  Surgeon: Mauri Pole, MD;  Location: [facility];  Service: Endoscopy;  Laterality: N/A;  . WISDOM TOOTH EXTRACTION  2013    Family History  Problem Relation Age of Onset  . Cancer Mother   . Hypertension Father   . Hyperlipidemia Father   . Anxiety disorder Father   . Depression Father   . Heart disease Father   . AAA (abdominal aortic aneurysm) Father   . Depression Sister   . Anxiety  disorder Sister   . Heart disease Maternal Grandmother   . Alzheimer's disease Maternal Grandmother   . Stroke Maternal Grandmother   . Gout Maternal Grandfather   . Heart disease Paternal Grandfather     Social History   Socioeconomic History  . Marital status: Married    Spouse name: Not on file  . Number of children: Not on file  . Years of education: Not on file  . Highest education level: Not on file  Occupational History  . Not on file  Tobacco Use  . Smoking status: Never Smoker  . Smokeless tobacco: Never Used  Substance and Sexual Activity  . Alcohol use: Yes    Comment: once every 6 months   . Drug use: Never  . Sexual activity: Not on file  Other Topics Concern  . Not on file  Social History Narrative  . Not on file   Social Determinants of Health   Financial Resource Strain:   . Difficulty of Paying Living Expenses: Not on file  Food Insecurity:   . Worried About Charity fundraiser in the Last Year: Not on file  . Ran Out of Food in the Last Year: Not on file  Transportation Needs:   . Lack of Transportation (Medical): Not on file  . Lack of Transportation (Non-Medical): Not on file  Physical Activity:   . Days of Exercise per Week: Not on file  . Minutes of Exercise per Session: Not on file  Stress:   . Feeling of  Stress : Not on file  Social Connections:   . Frequency of Communication with Friends and Family: Not on file  . Frequency of Social Gatherings with Friends and Family: Not on file  . Attends Religious Services: Not on file  . Active Member of Clubs or Organizations: Not on file  . Attends Archivist Meetings: Not on file  . Marital Status: Not on file    Current Outpatient Medications on File Prior to Visit  Medication Sig Dispense Refill  . amLODipine (NORVASC) 5 MG tablet Take 1 tablet (5 mg total) by mouth daily. 90 tablet 3  . dextroamphetamine (DEXEDRINE SPANSULE) 15 MG 24 hr capsule Take 1 capsule (15 mg total) by  mouth daily. 30 capsule 0  . labetalol (NORMODYNE) 200 MG tablet Take 1 tablet (200 mg total) by mouth 2 (two) times daily. 180 tablet 1  . omeprazole (PRILOSEC) 40 MG capsule Take 1 capsule (40 mg total) by mouth 2 (two) times daily before a meal. 90 capsule 3   No current facility-administered medications on file prior to visit.    Review of Systems: a complete, 10pt review of systems was completed with pertinent positives and negatives as documented in the HPI  Physical Exam: Vitals  Weight: 309.5 lb Height: 67in Body Surface Area: 2.43 m Body Mass Index: 48.47 kg/m  Temp.: 98.76F  Pulse: 95 (Regular)  P.OX: 97% (Room air) BP: 140/98 (Sitting, Left Arm, Standard)  Alert and well-appearing Extraocular motions intact, no scleral icterus Unlabored respirations Abdomen is soft, nondistended, there is mild tenderness to palpation in the epigastrium and medial right subcostal margin. No mass or organomegaly palpable.   CBC Latest Ref Rng & Units 02/07/2018 07/11/2017  WBC 4.0 - 10.5 K/uL 9.7 9.7  Hemoglobin 12.0 - 15.0 g/dL 12.2 12.3  Hematocrit 36.0 - 46.0 % 36.9 38.6  Platelets 150.0 - 400.0 K/uL 436.0(H) 393    CMP Latest Ref Rng & Units 01/14/2019 02/07/2018 07/11/2017  Glucose 70 - 99 mg/dL - 110(H) 102(H)  BUN 6 - 23 mg/dL - 16 11  Creatinine 0.40 - 1.20 mg/dL - 0.85 0.83  Sodium 135 - 145 mEq/L - 140 140  Potassium 3.5 - 5.1 mEq/L - 3.7 4.0  Chloride 96 - 112 mEq/L - 104 105  CO2 19 - 32 mEq/L - 27 25  Calcium 8.4 - 10.5 mg/dL - 8.8 8.8(L)  Total Protein 6.0 - 8.3 g/dL 6.7 6.7 6.6  Total Bilirubin 0.2 - 1.2 mg/dL 0.3 0.4 0.3  Alkaline Phos 39 - 117 U/L 45 52 41  AST 0 - 37 U/L 13 15 23   ALT 0 - 35 U/L 12 14 19     No results found for: INR, PROTIME  Imaging: No results found.   A/P: SYMPTOMATIC CHOLELITHIASIS (K80.20) Story: I recommend proceeding with laparoscopic cholecystectomy. Discussed risks of surgery including bleeding, pain, scarring,  intraabdominal injury specifically to the common bile duct and sequelae, bile leak, retained common bile duct stone, conversion to open surgery, failure to resolve symptoms, blood clots/ pulmonary embolus, heart attack, pneumonia, stroke, death. Questions welcomed and answered to patient's satisfaction  Patient Active Problem List   Diagnosis Date Noted  . Mixed hyperlipidemia 11/22/2018  . Hyperglycemia 02/09/2018  . Plantar fasciitis of left foot 02/07/2018  . Plantar fasciitis, bilateral 02/07/2018  . GERD with esophagitis 02/07/2018  . Esophageal dysphagia   . Chronic venous insufficiency 07/03/2017  . Varicose veins of bilateral lower extremities with other complications 0000000  . Controlled substance  agreement signed 06/07/2017  . Bilateral leg edema 06/07/2017  . Depression 12/23/2015  . Hypersomnia 12/23/2015  . Excessive daytime sleepiness 10/06/2015  . Adult ADHD 09/02/2015  . Anovulation 05/19/2015  . Benign essential hypertension 05/19/2015  . Generalized anxiety disorder 05/19/2015  . Obesity 05/19/2015       Romana Juniper, MD [facility]  See AMION to contact appropriate on-call provider

## 2019-02-05 ENCOUNTER — Encounter: Payer: Self-pay | Admitting: Nurse Practitioner

## 2019-02-05 DIAGNOSIS — F909 Attention-deficit hyperactivity disorder, unspecified type: Secondary | ICD-10-CM

## 2019-02-05 DIAGNOSIS — G4719 Other hypersomnia: Secondary | ICD-10-CM

## 2019-02-05 DIAGNOSIS — Z79899 Other long term (current) drug therapy: Secondary | ICD-10-CM

## 2019-02-05 MED ORDER — DEXTROAMPHETAMINE SULFATE ER 15 MG PO CP24: 15.0000 mg | ORAL_CAPSULE | Freq: Every day | ORAL | 0 refills | Status: DC

## 2019-02-05 MED FILL — D-AMPHETAMINE ER 15 MG CAP: 15 | 30 days supply | Qty: 30 | Fill #0

## 2019-02-06 MED FILL — OMEPRAZOLE 40 MG CPDR: 40 | 45 days supply | Qty: 90 | Fill #1

## 2019-03-11 ENCOUNTER — Encounter: Payer: Self-pay | Admitting: Nurse Practitioner

## 2019-03-11 ENCOUNTER — Telehealth (INDEPENDENT_AMBULATORY_CARE_PROVIDER_SITE_OTHER): Payer: No Typology Code available for payment source | Admitting: Nurse Practitioner

## 2019-03-11 ENCOUNTER — Other Ambulatory Visit: Payer: Self-pay

## 2019-03-11 VITALS — BP 138/90 | HR 100 | Ht 67.0 in | Wt 311.0 lb

## 2019-03-11 DIAGNOSIS — Z79899 Other long term (current) drug therapy: Secondary | ICD-10-CM

## 2019-03-11 DIAGNOSIS — I10 Essential (primary) hypertension: Secondary | ICD-10-CM

## 2019-03-11 DIAGNOSIS — F909 Attention-deficit hyperactivity disorder, unspecified type: Secondary | ICD-10-CM | POA: Diagnosis not present

## 2019-03-11 DIAGNOSIS — G4719 Other hypersomnia: Secondary | ICD-10-CM

## 2019-03-11 DIAGNOSIS — F325 Major depressive disorder, single episode, in full remission: Secondary | ICD-10-CM

## 2019-03-11 MED ORDER — DEXTROAMPHETAMINE SULFATE ER 15 MG PO CP24: 15.0000 mg | ORAL_CAPSULE | Freq: Every day | ORAL | 0 refills | Status: DC

## 2019-03-11 MED ORDER — LABETALOL HCL 200 MG PO TABS: 200.0000 mg | ORAL_TABLET | Freq: Two times a day (BID) | ORAL | 3 refills | Status: DC

## 2019-03-11 MED FILL — D-AMPHETAMINE ER 15 MG CAP: 15 | 30 days supply | Qty: 30 | Fill #0

## 2019-03-11 MED FILL — LABETALOL HCL 200 MG TABS: 200 | 90 days supply | Qty: 180 | Fill #0

## 2019-03-11 NOTE — Progress Notes (Signed)
Virtual Visit via Video Note  I connected with@ on 03/11/19 at  1:00 PM EST by a video enabled telemedicine application and verified that I am speaking with the correct person using two identifiers.  Location: Patient:Home Provider: Office Participants: patient and provider   I discussed the limitations of evaluation and management by telemedicine and the availability of in person appointments. I also discussed with the patient that there may be a patient responsible charge related to this service. The patient expressed understanding and agreed to proceed.  CC:HTN and ADD f/up  History of Present Illness: HTN: stable BP with amlodipine and lobetalol Wt Readings from Last 3 Encounters:  03/11/19 (!) 311 lb (141.1 kg)  11/21/18 (!) 312 lb 3.2 oz (141.6 kg)  07/09/18 (!) 313 lb 3.2 oz (142.1 kg)   ADD: stable mood, sleep and ADLs PMP database reviewed Dexedrine last filled 02/05/2019, #30tabs, no red flags Needs to update UDS and controlled substance database   Observations/Objective: Physical Exam  Constitutional: She is oriented to person, place, and time.  Cardiovascular: Normal rate.  Pulmonary/Chest: Effort normal.  Neurological: She is alert and oriented to person, place, and time.  Psychiatric: She has a normal mood and affect. Her behavior is normal. Thought content normal.  Vitals reviewed.  Assessment and Plan: Pleshette was seen today for follow-up.  Diagnoses and all orders for this visit:  Adult ADHD -     dextroamphetamine (DEXEDRINE SPANSULE) 15 MG 24 hr capsule; Take 1 capsule (15 mg total) by mouth daily. -     Cancel: ToxAssure Select,+Antidepr,UR -     Chiropodist; Future  Benign essential hypertension -     labetalol (NORMODYNE) 200 MG tablet; Take 1 tablet (200 mg total) by mouth 2 (two) times daily.  Excessive daytime sleepiness -     dextroamphetamine (DEXEDRINE SPANSULE) 15 MG 24 hr capsule; Take 1 capsule (15 mg total) by mouth  daily. -     Cancel: ToxAssure Select,+Antidepr,UR -     Chiropodist; Future  Encounter for medication management in attention deficit hyperactivity disorder (ADHD) -     dextroamphetamine (DEXEDRINE SPANSULE) 15 MG 24 hr capsule; Take 1 capsule (15 mg total) by mouth daily. -     ToxAssure Select,+Antidepr,UR; Future  Major depressive disorder with single episode, in full remission ([place]) -     Cancel: ToxAssure Select,+Antidepr,UR  Controlled substance agreement signed -     Cancel: ToxAssure Select,+Antidepr,UR -     Chiropodist; Future    Follow Up Instructions: See avs   I discussed the assessment and treatment plan with the patient. The patient was provided an opportunity to ask questions and all were answered. The patient agreed with the plan and demonstrated an understanding of the instructions.   The patient was advised to call back or seek an in-person evaluation if the symptoms worsen or if the condition fails to improve as anticipated.   Wilfred Lacy, NP

## 2019-03-12 ENCOUNTER — Other Ambulatory Visit: Payer: Self-pay

## 2019-03-12 ENCOUNTER — Other Ambulatory Visit: Payer: Self-pay | Admitting: Nurse Practitioner

## 2019-03-12 DIAGNOSIS — I10 Essential (primary) hypertension: Secondary | ICD-10-CM

## 2019-03-12 MED FILL — AMLODIPINE BESYLATE 5 MG TA: 5 | 90 days supply | Qty: 90 | Fill #0

## 2019-03-13 ENCOUNTER — Other Ambulatory Visit: Payer: No Typology Code available for payment source

## 2019-03-13 DIAGNOSIS — I10 Essential (primary) hypertension: Secondary | ICD-10-CM

## 2019-03-13 DIAGNOSIS — E782 Mixed hyperlipidemia: Secondary | ICD-10-CM

## 2019-03-13 DIAGNOSIS — F909 Attention-deficit hyperactivity disorder, unspecified type: Secondary | ICD-10-CM

## 2019-03-13 DIAGNOSIS — G4719 Other hypersomnia: Secondary | ICD-10-CM

## 2019-03-13 DIAGNOSIS — R739 Hyperglycemia, unspecified: Secondary | ICD-10-CM

## 2019-03-13 DIAGNOSIS — Z79899 Other long term (current) drug therapy: Secondary | ICD-10-CM

## 2019-03-19 LAB — TOXASSURE SELECT,+ANTIDEPR,UR

## 2019-04-06 NOTE — Patient Instructions (Signed)
DUE TO COVID-19 ONLY ONE VISITOR IS ALLOWED TO COME WITH YOU AND STAY IN THE WAITING ROOM ONLY DURING PRE OP AND PROCEDURE DAY OF SURGERY. THE 1 VISITOR MAY VISIT WITH YOU AFTER SURGERY IN YOUR PRIVATE ROOM DURING VISITING HOURS ONLY!  YOU NEED TO HAVE A COVID 19 TEST ON 04-07-19 @ _______, THIS TEST MUST BE DONE BEFORE SURGERY, COME  [name], [name] , [id number].  ([name]) ONCE YOUR COVID TEST IS COMPLETED, PLEASE BEGIN THE QUARANTINE INSTRUCTIONS AS OUTLINED IN YOUR HANDOUT.                Cheryl Ayala  04/06/2019   Your procedure is scheduled on: 04-10-19    Report to [facility] Main  Entrance    Report to Admitting at 9:00 AM     Call this number if you have problems the morning of surgery [phone]    Remember: Do not eat food or drink liquids :After Midnight.     Take these medicines the morning of surgery with A SIP OF WATER: Amlodipine (Norvasc), Labetalol ([place]ne), Omeprazole (Prilosec), and Dextroamphetamine (Dexedrine)  BRUSH YOUR TEETH MORNING OF SURGERY AND RINSE YOUR MOUTH OUT, NO CHEWING GUM CANDY OR MINTS.                               You may not have any metal on your body including hair pins and              piercings      Do not wear jewelry, make-up, lotions, powders or perfumes, deodorant              Do not wear nail polish on your fingernails.  Do not shave  48 hours prior to surgery.               Do not bring valuables to the hospital. [name].  Contacts, dentures or bridgework may not be worn into surgery.     Patients discharged the day of surgery will not be allowed to drive home. IF YOU ARE HAVING SURGERY AND GOING HOME THE SAME DAY, YOU MUST HAVE AN ADULT TO DRIVE YOU HOME AND BE WITH YOU FOR 24 HOURS. YOU MAY GO HOME BY TAXI OR UBER OR ORTHERWISE, BUT AN ADULT MUST ACCOMPANY YOU HOME AND STAY WITH YOU FOR 24 HOURS.  Name and phone number of your  driver:  Special Instructions: N/A              Please read over the following fact sheets you were given: _____________________________________________________________________             [facility] - Preparing for Surgery Before surgery, you can play an important role.  Because skin is not sterile, your skin needs to be as free of germs as possible.  You can reduce the number of germs on your skin by washing with CHG (chlorahexidine gluconate) soap before surgery.  CHG is an antiseptic cleaner which kills germs and bonds with the skin to continue killing germs even after washing. Please DO NOT use if you have an allergy to CHG or antibacterial soaps.  If your skin becomes reddened/irritated stop using the CHG and inform your nurse when you arrive at Short Stay. Do not shave (including legs and underarms)  for at least 48 hours prior to the first CHG shower.  You may shave your face/neck. Please follow these instructions carefully:  1.  Shower with CHG Soap the night before surgery and the  morning of Surgery.  2.  If you choose to wash your hair, wash your hair first as usual with your  normal  shampoo.  3.  After you shampoo, rinse your hair and body thoroughly to remove the  shampoo.                           4.  Use CHG as you would any other liquid soap.  You can apply chg directly  to the skin and wash                       Gently with a scrungie or clean washcloth.  5.  Apply the CHG Soap to your body ONLY FROM THE NECK DOWN.   Do not use on face/ open                           Wound or open sores. Avoid contact with eyes, ears mouth and genitals (private parts).                       Wash face,  Genitals (private parts) with your normal soap.             6.  Wash thoroughly, paying special attention to the area where your surgery  will be performed.  7.  Thoroughly rinse your body with warm water from the neck down.  8.  DO NOT shower/wash with your normal soap after using and rinsing  off  the CHG Soap.                9.  Pat yourself dry with a clean towel.            10.  Wear clean pajamas.            11.  Place clean sheets on your bed the night of your first shower and do not  sleep with pets. Day of Surgery : Do not apply any lotions/deodorants the morning of surgery.  Please wear clean clothes to the hospital/surgery center.  FAILURE TO FOLLOW THESE INSTRUCTIONS MAY RESULT IN THE CANCELLATION OF YOUR SURGERY PATIENT SIGNATURE_________________________________  NURSE SIGNATURE__________________________________  ________________________________________________________________________

## 2019-04-06 NOTE — Progress Notes (Signed)
PCP - Flossie Buffy, NP  Cardiologist -   Chest x-ray -  EKG - 11-21-18  Stress Test -  ECHO - 05-23-17 Cardiac Cath -   Sleep Study -  CPAP -   Fasting Blood Sugar -  Checks Blood Sugar _____ times a day  Blood Thinner Instructions: Aspirin Instructions: Last Dose:  Anesthesia review:   Patient denies shortness of breath, fever, cough and chest pain at PAT appointment   Patient verbalized understanding of instructions that were given to them at the PAT appointment. Patient was also instructed that they will need to review over the PAT instructions again at home before surgery.

## 2019-04-07 ENCOUNTER — Encounter (HOSPITAL_COMMUNITY)
Admission: RE | Admit: 2019-04-07 | Discharge: 2019-04-07 | Disposition: A | Discharge status: Home / Self Care | Payer: No Typology Code available for payment source | Source: Ambulatory Visit | Attending: Surgery | Admitting: Surgery

## 2019-04-07 ENCOUNTER — Encounter (HOSPITAL_COMMUNITY): Payer: Self-pay

## 2019-04-07 ENCOUNTER — Other Ambulatory Visit (HOSPITAL_COMMUNITY)
Admission: RE | Admit: 2019-04-07 | Discharge: 2019-04-07 | Disposition: A | Discharge status: Home / Self Care | Payer: No Typology Code available for payment source | Source: Ambulatory Visit | Attending: Surgery | Admitting: Surgery

## 2019-04-07 ENCOUNTER — Other Ambulatory Visit (HOSPITAL_COMMUNITY): Payer: No Typology Code available for payment source

## 2019-04-07 ENCOUNTER — Other Ambulatory Visit: Payer: Self-pay

## 2019-04-07 DIAGNOSIS — Z6841 Body Mass Index (BMI) 40.0 and over, adult: Secondary | ICD-10-CM | POA: Diagnosis not present

## 2019-04-07 DIAGNOSIS — I10 Essential (primary) hypertension: Secondary | ICD-10-CM | POA: Diagnosis not present

## 2019-04-07 DIAGNOSIS — Z20822 Contact with and (suspected) exposure to covid-19: Secondary | ICD-10-CM | POA: Diagnosis not present

## 2019-04-07 DIAGNOSIS — K8010 Calculus of gallbladder with chronic cholecystitis without obstruction: Secondary | ICD-10-CM | POA: Diagnosis not present

## 2019-04-07 DIAGNOSIS — K8050 Calculus of bile duct without cholangitis or cholecystitis without obstruction: Secondary | ICD-10-CM | POA: Diagnosis present

## 2019-04-07 DIAGNOSIS — K2100 Gastro-esophageal reflux disease with esophagitis, without bleeding: Secondary | ICD-10-CM | POA: Diagnosis not present

## 2019-04-07 DIAGNOSIS — F909 Attention-deficit hyperactivity disorder, unspecified type: Secondary | ICD-10-CM | POA: Diagnosis not present

## 2019-04-07 DIAGNOSIS — Z79899 Other long term (current) drug therapy: Secondary | ICD-10-CM | POA: Diagnosis not present

## 2019-04-07 LAB — CBC
HCT: 36.9 % (ref 36.0–46.0)
Hemoglobin: 11.7 g/dL — ABNORMAL LOW (ref 12.0–15.0)
MCH: 25.4 pg — ABNORMAL LOW (ref 26.0–34.0)
MCHC: 31.7 g/dL (ref 30.0–36.0)
MCV: 80 fL (ref 80.0–100.0)
Platelets: 402 10*3/uL — ABNORMAL HIGH (ref 150–400)
RBC: 4.61 MIL/uL (ref 3.87–5.11)
RDW: 13.7 % (ref 11.5–15.5)
WBC: 9.4 10*3/uL (ref 4.0–10.5)
nRBC: 0 % (ref 0.0–0.2)

## 2019-04-07 LAB — BASIC METABOLIC PANEL
Anion gap: 9 (ref 5–15)
BUN: 12 mg/dL (ref 6–20)
CO2: 25 mmol/L (ref 22–32)
Calcium: 8.7 mg/dL — ABNORMAL LOW (ref 8.9–10.3)
Chloride: 107 mmol/L (ref 98–111)
Creatinine, Ser: 0.84 mg/dL (ref 0.44–1.00)
GFR calc Af Amer: >60 mL/min (ref >60)
GFR calc non Af Amer: >60 mL/min (ref >60)
Glucose, Bld: 125 mg/dL — ABNORMAL HIGH (ref 70–99)
Potassium: 4.2 mmol/L (ref 3.5–5.1)
Sodium: 141 mmol/L (ref 135–145)

## 2019-04-07 LAB — SARS CORONAVIRUS 2 (TAT 6-24 HRS): SARS Coronavirus 2: NEGATIVE

## 2019-04-09 HISTORY — PX: CHOLECYSTECTOMY, LAPAROSCOPIC: SHX56

## 2019-04-09 MED ORDER — BUPIVACAINE LIPOSOME 1.3 % IJ SUSP
20.0000 mL | Freq: Once | INTRAMUSCULAR | Status: DC
  Filled 2019-04-09: qty 20

## 2019-04-09 MED ORDER — VANCOMYCIN HCL 1500 MG/300ML IV SOLN
1500.0000 mg | INTRAVENOUS | Status: AC
  Administered 2019-04-10: 1500 mg via INTRAVENOUS
  Filled 2019-04-09: qty 300

## 2019-04-09 NOTE — H&P (Signed)
Surgical H&P  Requesting provider: Wilfred Lacy NP  CC: biliary colic  HPI: Very pleasant 34 year old woman with history of symptomatic cholelithiasis. She has known gallstones measuring up to 2.3 cm. She did have an episode of epigastric and subxiphoid pain about a year ago and was evaluated by my partner but she did not pursue surgery at that time. She has not had significant pain, denies postprandial nausea however has terrible reflux and indigestion. 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 She had a follow-up ultrasound last week but confirms cholelithiasis as above, and she states that since that time she has had more tenderness in the right upper quadrant.  Denies prior abdominal surgery.       Allergies  Allergen Reactions  . Penicillins Rash    Has patient had a PCN reaction causing immediate rash, facial/tongue/throat swelling, SOB or lightheadedness with hypotension: Yes  Has patient had a PCN reaction causing severe rash involving mucus membranes or skin necrosis: No  Has patient had a PCN reaction that required hospitalization: No  Has patient had a PCN reaction occurring within the last 10 years: No  If all of the above answers are "NO", then may proceed with Cephalosporin use.   . Sulfa Antibiotics Rash       Past Medical History:  Diagnosis Date  . Anxiety   . Hypertension   . Plantar fasciitis   . Venous insufficiency         Past Surgical History:  Procedure Laterality Date  . ESOPHAGEAL MANOMETRY N/A 09/25/2017   Procedure: ESOPHAGEAL MANOMETRY (EM); Surgeon: Mauri Pole, MD; Location: [facility]; Service: Endoscopy; Laterality: N/A;  . WISDOM TOOTH EXTRACTION  2013        Family History  Problem Relation Age of Onset  . Cancer Mother   . Hypertension Father   . Hyperlipidemia Father   . Anxiety disorder Father   . Depression Father   . Heart disease Father   . AAA (abdominal aortic aneurysm) Father   . Depression Sister   .  Anxiety disorder Sister   . Heart disease Maternal Grandmother   . Alzheimer's disease Maternal Grandmother   . Stroke Maternal Grandmother   . Gout Maternal Grandfather   . Heart disease Paternal Grandfather    Social History        Socioeconomic History  . Marital status: Married    Spouse name: Not on file  . Number of children: Not on file  . Years of education: Not on file  . Highest education level: Not on file  Occupational History  . Not on file  Tobacco Use  . Smoking status: Never Smoker  . Smokeless tobacco: Never Used  Substance and Sexual Activity  . Alcohol use: Yes    Comment: once every 6 months   . Drug use: Never  . Sexual activity: Not on file  Other Topics Concern  . Not on file  Social History Narrative  . Not on file   Social Determinants of Health      Financial Resource Strain:   . Difficulty of Paying Living Expenses: Not on file  Food Insecurity:   . Worried About Charity fundraiser in the Last Year: Not on file  . Ran Out of Food in the Last Year: Not on file  Transportation Needs:   . Lack of Transportation (Medical): Not on file  . Lack of Transportation (Non-Medical): Not on file  Physical Activity:   . Days  of Exercise per Week: Not on file  . Minutes of Exercise per Session: Not on file  Stress:   . Feeling of Stress : Not on file  Social Connections:   . Frequency of Communication with Friends and Family: Not on file  . Frequency of Social Gatherings with Friends and Family: Not on file  . Attends Religious Services: Not on file  . Active Member of Clubs or Organizations: Not on file  . Attends Archivist Meetings: Not on file  . Marital Status: Not on file         Current Outpatient Medications on File Prior to Visit  Medication Sig Dispense Refill  . amLODipine (NORVASC) 5 MG tablet Take 1 tablet (5 mg total) by mouth daily. 90 tablet 3  . dextroamphetamine (DEXEDRINE SPANSULE) 15 MG 24 hr capsule Take 1 capsule  (15 mg total) by mouth daily. 30 capsule 0  . labetalol (NORMODYNE) 200 MG tablet Take 1 tablet (200 mg total) by mouth 2 (two) times daily. 180 tablet 1  . omeprazole (PRILOSEC) 40 MG capsule Take 1 capsule (40 mg total) by mouth 2 (two) times daily before a meal. 90 capsule 3   No current facility-administered medications on file prior to visit.   Review of Systems: a complete, 10pt review of systems was completed with pertinent positives and negatives as documented in the HPI  Physical Exam:  Vitals  Weight: 309.5 lb Height: 67 in  Body Surface Area: 2.43 m Body Mass Index: 48.47 kg/m  Temp.: 98.3 F Pulse: 95 (Regular) P.OX: 97% (Room air)  BP: 140/98 (Sitting, Left Arm, Standard)  Alert and well-appearing  Extraocular motions intact, no scleral icterus  Unlabored respirations  Abdomen is soft, nondistended, there is mild tenderness to palpation in the epigastrium and medial right subcostal margin. No mass or organomegaly palpable.  CBC Latest Ref Rng & Units 02/07/2018 07/11/2017  WBC 4.0 - 10.5 K/uL 9.7 9.7  Hemoglobin 12.0 - 15.0 g/dL 12.2 12.3  Hematocrit 36.0 - 46.0 % 36.9 38.6  Platelets 150.0 - 400.0 K/uL 436.0(H) 393   CMP Latest Ref Rng & Units 01/14/2019 02/07/2018 07/11/2017  Glucose 70 - 99 mg/dL - 110(H) 102(H)  BUN 6 - 23 mg/dL - 16 11  Creatinine 0.40 - 1.20 mg/dL - 0.85 0.83  Sodium 135 - 145 mEq/L - 140 140  Potassium 3.5 - 5.1 mEq/L - 3.7 4.0  Chloride 96 - 112 mEq/L - 104 105  CO2 19 - 32 mEq/L - 27 25  Calcium 8.4 - 10.5 mg/dL - 8.8 8.8(L)  Total Protein 6.0 - 8.3 g/dL 6.7 6.7 6.6  Total Bilirubin 0.2 - 1.2 mg/dL 0.3 0.4 0.3  Alkaline Phos 39 - 117 U/L 45 52 41  AST 0 - 37 U/L 13 15 23   ALT 0 - 35 U/L 12 14 19    Recent Labs    Imaging:  Imaging Results (Last 48 hours)    A/P: SYMPTOMATIC CHOLELITHIASIS (K80.20)  Story: I recommend proceeding with laparoscopic cholecystectomy. Discussed risks of surgery including bleeding, pain, scarring,  intraabdominal injury specifically to the common bile duct and sequelae, bile leak, retained common bile duct stone, conversion to open surgery, failure to resolve symptoms, blood clots/ pulmonary embolus, heart attack, pneumonia, stroke, death. Questions welcomed and answered to patient's satisfaction      Patient Active Problem List   Diagnosis Date Noted  . Mixed hyperlipidemia 11/22/2018  . Hyperglycemia 02/09/2018  . Plantar fasciitis of left foot 02/07/2018  .  Plantar fasciitis, bilateral 02/07/2018  . GERD with esophagitis 02/07/2018  . Esophageal dysphagia   . Chronic venous insufficiency 07/03/2017  . Varicose veins of bilateral lower extremities with other complications 0000000  . Controlled substance agreement signed 06/07/2017  . Bilateral leg edema 06/07/2017  . Depression 12/23/2015  . Hypersomnia 12/23/2015  . Excessive daytime sleepiness 10/06/2015  . Adult ADHD 09/02/2015  . Anovulation 05/19/2015  . Benign essential hypertension 05/19/2015  . Generalized anxiety disorder 05/19/2015  . Obesity 05/19/2015   Romana Juniper, MD  [facility]  See AMION to contact appropriate on-call provider

## 2019-04-10 ENCOUNTER — Encounter (HOSPITAL_COMMUNITY)
Admission: RE | Disposition: A | Discharge status: Home / Self Care | Payer: Self-pay | Source: Home / Self Care | Attending: Surgery

## 2019-04-10 ENCOUNTER — Ambulatory Visit (HOSPITAL_COMMUNITY): Payer: No Typology Code available for payment source

## 2019-04-10 ENCOUNTER — Ambulatory Visit (HOSPITAL_COMMUNITY)
Admission: RE | Admit: 2019-04-10 | Discharge: 2019-04-10 | Disposition: A | Discharge status: Home / Self Care | Payer: No Typology Code available for payment source | Attending: Surgery | Admitting: Surgery

## 2019-04-10 ENCOUNTER — Encounter (HOSPITAL_COMMUNITY): Payer: Self-pay | Admitting: Surgery

## 2019-04-10 ENCOUNTER — Other Ambulatory Visit: Payer: Self-pay

## 2019-04-10 DIAGNOSIS — Z6841 Body Mass Index (BMI) 40.0 and over, adult: Secondary | ICD-10-CM | POA: Insufficient documentation

## 2019-04-10 DIAGNOSIS — Z79899 Other long term (current) drug therapy: Secondary | ICD-10-CM | POA: Insufficient documentation

## 2019-04-10 DIAGNOSIS — K2100 Gastro-esophageal reflux disease with esophagitis, without bleeding: Secondary | ICD-10-CM | POA: Insufficient documentation

## 2019-04-10 DIAGNOSIS — K8010 Calculus of gallbladder with chronic cholecystitis without obstruction: Secondary | ICD-10-CM | POA: Insufficient documentation

## 2019-04-10 DIAGNOSIS — F909 Attention-deficit hyperactivity disorder, unspecified type: Secondary | ICD-10-CM | POA: Insufficient documentation

## 2019-04-10 DIAGNOSIS — Z20822 Contact with and (suspected) exposure to covid-19: Secondary | ICD-10-CM | POA: Insufficient documentation

## 2019-04-10 DIAGNOSIS — I10 Essential (primary) hypertension: Secondary | ICD-10-CM | POA: Insufficient documentation

## 2019-04-10 LAB — PREGNANCY, URINE: Preg Test, Ur: NEGATIVE

## 2019-04-10 SURGERY — CHOLECYSTECTOMY, ROBOT-ASSISTED, LAPAROSCOPIC
Anesthesia: General | Site: Abdomen

## 2019-04-10 MED ORDER — 0.9 % SODIUM CHLORIDE (POUR BTL) OPTIME
TOPICAL | Status: DC | PRN
  Administered 2019-04-10: 1000 mL

## 2019-04-10 MED ORDER — ONDANSETRON HCL 4 MG/2ML IJ SOLN
INTRAMUSCULAR | Status: AC
  Filled 2019-04-10: qty 2

## 2019-04-10 MED ORDER — GABAPENTIN 300 MG PO CAPS
300.0000 mg | ORAL_CAPSULE | ORAL | Status: AC
  Administered 2019-04-10: 300 mg via ORAL
  Filled 2019-04-10: qty 1

## 2019-04-10 MED ORDER — ONDANSETRON HCL 4 MG/2ML IJ SOLN
INTRAMUSCULAR | Status: DC | PRN
  Administered 2019-04-10: 4 mg via INTRAVENOUS

## 2019-04-10 MED ORDER — SODIUM CHLORIDE 0.9% FLUSH: 3.0000 mL | Freq: Two times a day (BID) | INTRAVENOUS | Status: DC

## 2019-04-10 MED ORDER — LACTATED RINGERS IV SOLN: INTRAVENOUS | Status: DC

## 2019-04-10 MED ORDER — BUPIVACAINE HCL (PF) 0.25 % IJ SOLN
INTRAMUSCULAR | Status: DC | PRN
  Administered 2019-04-10: 30 mL

## 2019-04-10 MED ORDER — SUGAMMADEX SODIUM 200 MG/2ML IV SOLN
INTRAVENOUS | Status: DC | PRN
  Administered 2019-04-10: 300 mg via INTRAVENOUS

## 2019-04-10 MED ORDER — OXYCODONE HCL 5 MG PO TABS
ORAL_TABLET | ORAL | Status: AC
  Filled 2019-04-10: qty 1

## 2019-04-10 MED ORDER — FENTANYL CITRATE (PF) 100 MCG/2ML IJ SOLN: 25.0000 ug | INTRAMUSCULAR | Status: DC | PRN

## 2019-04-10 MED ORDER — PROPOFOL 10 MG/ML IV BOLUS
INTRAVENOUS | Status: AC
  Filled 2019-04-10: qty 20

## 2019-04-10 MED ORDER — FENTANYL CITRATE (PF) 250 MCG/5ML IJ SOLN
INTRAMUSCULAR | Status: AC
  Filled 2019-04-10: qty 5

## 2019-04-10 MED ORDER — LIDOCAINE 2% (20 MG/ML) 5 ML SYRINGE
INTRAMUSCULAR | Status: AC
  Filled 2019-04-10: qty 5

## 2019-04-10 MED ORDER — TRAMADOL HCL 50 MG PO TABS: 50.0000 mg | ORAL_TABLET | Freq: Four times a day (QID) | ORAL | 0 refills | Status: DC | PRN

## 2019-04-10 MED ORDER — DOCUSATE SODIUM 100 MG PO CAPS: 100.0000 mg | ORAL_CAPSULE | Freq: Two times a day (BID) | ORAL | 0 refills | Status: AC

## 2019-04-10 MED ORDER — ACETAMINOPHEN 650 MG RE SUPP
650.0000 mg | RECTAL | Status: DC | PRN
  Filled 2019-04-10: qty 1

## 2019-04-10 MED ORDER — LACTATED RINGERS IR SOLN
Status: DC | PRN
  Administered 2019-04-10: 1000 mL

## 2019-04-10 MED ORDER — PHENYLEPHRINE 40 MCG/ML (10ML) SYRINGE FOR IV PUSH (FOR BLOOD PRESSURE SUPPORT)
PREFILLED_SYRINGE | INTRAVENOUS | Status: DC | PRN
  Administered 2019-04-10 (×2): 80 ug via INTRAVENOUS
  Administered 2019-04-10: 160 ug via INTRAVENOUS
  Administered 2019-04-10: 120 ug via INTRAVENOUS
  Administered 2019-04-10: 40 ug via INTRAVENOUS
  Administered 2019-04-10: 80 ug via INTRAVENOUS

## 2019-04-10 MED ORDER — INDOCYANINE GREEN 25 MG IV SOLR
2.5000 mg | Freq: Once | INTRAVENOUS | Status: AC
  Administered 2019-04-10: 2.5 mg via INTRAVENOUS
  Filled 2019-04-10: qty 1

## 2019-04-10 MED ORDER — FENTANYL CITRATE (PF) 100 MCG/2ML IJ SOLN
INTRAMUSCULAR | Status: DC | PRN
  Administered 2019-04-10: 150 ug via INTRAVENOUS
  Administered 2019-04-10 (×2): 50 ug via INTRAVENOUS

## 2019-04-10 MED ORDER — MEPERIDINE HCL 50 MG/ML IJ SOLN: 6.2500 mg | INTRAMUSCULAR | Status: DC | PRN

## 2019-04-10 MED ORDER — DEXMEDETOMIDINE HCL IN NACL 200 MCG/50ML IV SOLN
INTRAVENOUS | Status: DC | PRN
  Administered 2019-04-10: 20 ug via INTRAVENOUS

## 2019-04-10 MED ORDER — ROCURONIUM BROMIDE 10 MG/ML (PF) SYRINGE
PREFILLED_SYRINGE | INTRAVENOUS | Status: DC | PRN
  Administered 2019-04-10: 70 mg via INTRAVENOUS
  Administered 2019-04-10: 5 mg via INTRAVENOUS

## 2019-04-10 MED ORDER — ACETAMINOPHEN 500 MG PO TABS
1000.0000 mg | ORAL_TABLET | ORAL | Status: AC
  Administered 2019-04-10: 1000 mg via ORAL
  Filled 2019-04-10: qty 2

## 2019-04-10 MED ORDER — SODIUM CHLORIDE 0.9% FLUSH: 3.0000 mL | INTRAVENOUS | Status: DC | PRN

## 2019-04-10 MED ORDER — BUPIVACAINE LIPOSOME 1.3 % IJ SUSP
INTRAMUSCULAR | Status: DC | PRN
  Administered 2019-04-10: 20 mL

## 2019-04-10 MED ORDER — BUPIVACAINE HCL (PF) 0.25 % IJ SOLN
INTRAMUSCULAR | Status: AC
  Filled 2019-04-10: qty 30

## 2019-04-10 MED ORDER — DEXAMETHASONE SODIUM PHOSPHATE 10 MG/ML IJ SOLN
INTRAMUSCULAR | Status: DC | PRN
  Administered 2019-04-10: 8 mg via INTRAVENOUS

## 2019-04-10 MED ORDER — ONDANSETRON HCL 4 MG/2ML IJ SOLN: 4.0000 mg | Freq: Once | INTRAMUSCULAR | Status: DC | PRN

## 2019-04-10 MED ORDER — LIDOCAINE 2% (20 MG/ML) 5 ML SYRINGE
INTRAMUSCULAR | Status: DC | PRN
  Administered 2019-04-10: 100 mg via INTRAVENOUS

## 2019-04-10 MED ORDER — HYDROMORPHONE HCL 1 MG/ML IJ SOLN: 0.2500 mg | INTRAMUSCULAR | Status: DC | PRN

## 2019-04-10 MED ORDER — OXYCODONE HCL 5 MG PO TABS
5.0000 mg | ORAL_TABLET | ORAL | Status: DC | PRN
  Administered 2019-04-10 (×2): 5 mg via ORAL

## 2019-04-10 MED ORDER — DEXMEDETOMIDINE HCL IN NACL 200 MCG/50ML IV SOLN
INTRAVENOUS | Status: AC
  Filled 2019-04-10: qty 50

## 2019-04-10 MED ORDER — CHLORHEXIDINE GLUCONATE 4 % EX LIQD: 60.0000 mL | Freq: Once | CUTANEOUS | Status: DC

## 2019-04-10 MED ORDER — PROPOFOL 10 MG/ML IV BOLUS
INTRAVENOUS | Status: DC | PRN
  Administered 2019-04-10: 200 mg via INTRAVENOUS

## 2019-04-10 MED ORDER — ACETAMINOPHEN 325 MG PO TABS: 650.0000 mg | ORAL_TABLET | ORAL | Status: DC | PRN

## 2019-04-10 MED ORDER — ROCURONIUM BROMIDE 10 MG/ML (PF) SYRINGE
PREFILLED_SYRINGE | INTRAVENOUS | Status: AC
  Filled 2019-04-10: qty 10

## 2019-04-10 MED ORDER — MIDAZOLAM HCL 2 MG/2ML IJ SOLN
INTRAMUSCULAR | Status: AC
  Filled 2019-04-10: qty 2

## 2019-04-10 MED ORDER — PHENYLEPHRINE HCL-NACL 10-0.9 MG/250ML-% IV SOLN
INTRAVENOUS | Status: DC | PRN
  Administered 2019-04-10: 50 ug/min via INTRAVENOUS

## 2019-04-10 MED ORDER — MIDAZOLAM HCL 5 MG/5ML IJ SOLN
INTRAMUSCULAR | Status: DC | PRN
  Administered 2019-04-10: 2 mg via INTRAVENOUS

## 2019-04-10 MED ORDER — KETOROLAC TROMETHAMINE 30 MG/ML IJ SOLN: 30.0000 mg | Freq: Once | INTRAMUSCULAR | Status: DC | PRN

## 2019-04-10 MED ORDER — DEXAMETHASONE SODIUM PHOSPHATE 10 MG/ML IJ SOLN
INTRAMUSCULAR | Status: AC
  Filled 2019-04-10: qty 1

## 2019-04-10 MED ORDER — SODIUM CHLORIDE 0.9 % IV SOLN: 250.0000 mL | INTRAVENOUS | Status: DC | PRN

## 2019-04-10 MED FILL — DOK 100 MG SOFTGEL: 100 | 15 days supply | Qty: 30 | Fill #0

## 2019-04-10 MED FILL — traMADol HCL 50 MG TABS: 50 | 5 days supply | Qty: 20 | Fill #0

## 2019-04-10 SURGICAL SUPPLY — 55 items
APPLIER CLIP 5 13 M/L LIGAMAX5 (MISCELLANEOUS)
BLADE SURG SZ11 CARB STEEL (BLADE) ×2 IMPLANT
CHLORAPREP W/TINT 26 (MISCELLANEOUS) ×2 IMPLANT
CLIP APPLIE 5 13 M/L LIGAMAX5 (MISCELLANEOUS) IMPLANT
CLIP VESOLOCK LG 6/CT PURPLE (CLIP) ×4 IMPLANT
COVER TIP SHEARS 8 DVNC (MISCELLANEOUS) ×1 IMPLANT
COVER TIP SHEARS 8MM DA VINCI (MISCELLANEOUS) ×1
COVER WAND RF STERILE (DRAPES) ×2 IMPLANT
DECANTER SPIKE VIAL GLASS SM (MISCELLANEOUS) ×2 IMPLANT
DERMABOND ADVANCED (GAUZE/BANDAGES/DRESSINGS) ×1
DERMABOND ADVANCED .7 DNX12 (GAUZE/BANDAGES/DRESSINGS) ×1 IMPLANT
DRAPE ARM DVNC X/XI (DISPOSABLE) ×4 IMPLANT
DRAPE COLUMN DVNC XI (DISPOSABLE) ×1 IMPLANT
DRAPE DA VINCI XI ARM (DISPOSABLE) ×4
DRAPE DA VINCI XI COLUMN (DISPOSABLE) ×1
ELECT PENCIL ROCKER SW 15FT (MISCELLANEOUS) IMPLANT
ELECT REM PT RETURN 15FT ADLT (MISCELLANEOUS) ×2 IMPLANT
GAUZE SPONGE 2X2 8PLY STRL LF (GAUZE/BANDAGES/DRESSINGS) IMPLANT
GLOVE BIO SURGEON STRL SZ 6 (GLOVE) ×6 IMPLANT
GLOVE BIOGEL PI IND STRL 6.5 (GLOVE) ×1 IMPLANT
GLOVE BIOGEL PI INDICATOR 6.5 (GLOVE) ×1
GLOVE INDICATOR 6.5 STRL GRN (GLOVE) ×4 IMPLANT
GLOVE SURG SS PI 7.0 STRL IVOR (GLOVE) ×2 IMPLANT
GOWN STRL REUS W/TWL LRG LVL3 (GOWN DISPOSABLE) ×6 IMPLANT
GOWN STRL REUS W/TWL XL LVL3 (GOWN DISPOSABLE) ×2 IMPLANT
GRASPER SUT TROCAR 14GX15 (MISCELLANEOUS) IMPLANT
IRRIGATOR SUCT 8 DISP DVNC XI (IRRIGATION / IRRIGATOR) IMPLANT
IRRIGATOR SUCTION 8MM XI DISP (IRRIGATION / IRRIGATOR)
KIT BASIN OR (CUSTOM PROCEDURE TRAY) ×2 IMPLANT
KIT PROCEDURE DA VINCI SI (MISCELLANEOUS)
KIT PROCEDURE DVNC SI (MISCELLANEOUS) IMPLANT
KIT TURNOVER KIT A (KITS) IMPLANT
MANIFOLD NEPTUNE II (INSTRUMENTS) ×2 IMPLANT
NEEDLE HYPO 22GX1.5 SAFETY (NEEDLE) ×2 IMPLANT
NEEDLE INSUFFLATION 14GA 120MM (NEEDLE) ×2 IMPLANT
PACK CARDIOVASCULAR III (CUSTOM PROCEDURE TRAY) ×2 IMPLANT
SEAL CANN UNIV 5-8 DVNC XI (MISCELLANEOUS) ×4 IMPLANT
SEAL XI 5MM-8MM UNIVERSAL (MISCELLANEOUS) ×4
SEALER VESSEL DA VINCI XI (MISCELLANEOUS)
SEALER VESSEL EXT DVNC XI (MISCELLANEOUS) IMPLANT
SET IRRIG TUBING LAPAROSCOPIC (IRRIGATION / IRRIGATOR) ×2 IMPLANT
SOL ANTI FOG 6CC (MISCELLANEOUS) ×1 IMPLANT
SOLUTION ANTI FOG 6CC (MISCELLANEOUS) ×1
SOLUTION ELECTROLUBE (MISCELLANEOUS) ×2 IMPLANT
SPONGE GAUZE 2X2 STER 10/PKG (GAUZE/BANDAGES/DRESSINGS)
SPONGE LAP 18X18 RF (DISPOSABLE) ×2 IMPLANT
SUT MNCRL AB 4-0 PS2 18 (SUTURE) ×2 IMPLANT
SUT VICRYL 0 UR6 27IN ABS (SUTURE) ×2 IMPLANT
SYR CONTROL 10ML LL (SYRINGE) ×2 IMPLANT
SYS RETRIEVAL 5MM INZII UNIV (BASKET) ×2
SYSTEM RETRIEVL 5MM INZII UNIV (BASKET) ×1 IMPLANT
TOWEL OR 17X26 10 PK STRL BLUE (TOWEL DISPOSABLE) ×2 IMPLANT
TOWEL OR NON WOVEN STRL DISP B (DISPOSABLE) ×2 IMPLANT
TROCAR BLADELESS OPT 5 100 (ENDOMECHANICALS) IMPLANT
TUBING INSUFFLATION 10FT LAP (TUBING) ×2 IMPLANT

## 2019-04-10 NOTE — Anesthesia Preprocedure Evaluation (Addendum)
Anesthesia Evaluation  Patient identified by MRN, date of birth, ID band Patient awake    Reviewed: Allergy & Precautions, NPO status , Patient's Chart, lab work & pertinent test results  Airway Mallampati: I       Dental no notable dental hx. (+) Teeth Intact   Pulmonary neg pulmonary ROS,    Pulmonary exam normal breath sounds clear to auscultation       Cardiovascular hypertension, Pt. on medications and Pt. on home beta blockers Normal cardiovascular exam Rhythm:Regular Rate:Normal     Neuro/Psych PSYCHIATRIC DISORDERS Anxiety Depression negative neurological ROS     GI/Hepatic Neg liver ROS, GERD  Medicated and Controlled,  Endo/Other  Morbid obesity  Renal/GU negative Renal ROS  negative genitourinary   Musculoskeletal negative musculoskeletal ROS (+)   Abdominal (+) + obese,   Peds  Hematology negative hematology ROS (+)   Anesthesia Other Findings   Reproductive/Obstetrics                            Anesthesia Physical Anesthesia Plan  ASA: III  Anesthesia Plan: General   Post-op Pain Management:    Induction: Intravenous  PONV Risk Score and Plan: 4 or greater and Ondansetron, Dexamethasone and Midazolam  Airway Management Planned: Oral ETT  Additional Equipment: None  Intra-op Plan:   Post-operative Plan: Extubation in OR  Informed Consent: I have reviewed the patients History and Physical, chart, labs and discussed the procedure including the risks, benefits and alternatives for the proposed anesthesia with the patient or authorized representative who has indicated his/her understanding and acceptance.     Dental advisory given  Plan Discussed with: CRNA  Anesthesia Plan Comments:        Anesthesia Quick Evaluation

## 2019-04-10 NOTE — Anesthesia Postprocedure Evaluation (Signed)
Anesthesia Post Note  Patient: Cheryl Ayala  Procedure(s) Performed: XI ROBOTIC CHOLECYSTECTOMY (N/A Abdomen)     Patient location during evaluation: PACU Anesthesia Type: General Level of consciousness: sedated Pain management: pain level controlled Vital Signs Assessment: post-procedure vital signs reviewed and stable Respiratory status: spontaneous breathing Cardiovascular status: stable Postop Assessment: no apparent nausea or vomiting Anesthetic complications: no    Last Vitals:  Vitals:   04/10/19 0909 04/10/19 1305  BP: (!) 150/88 106/67  Pulse: 77 82  Resp: 18 16  Temp: 36.6 C 36.7 C  SpO2: 98% 100%    Last Pain:  Vitals:   04/10/19 1305  TempSrc:   PainSc: Asleep   Pain Goal:                   Huston Foley

## 2019-04-10 NOTE — Anesthesia Procedure Notes (Addendum)
Procedure Name: Intubation Date/Time: 04/10/2019 11:03 AM Performed by: Gean Maidens, CRNA Pre-anesthesia Checklist: Patient identified, Emergency Drugs available, Suction available and Patient being monitored Patient Re-evaluated:Patient Re-evaluated prior to induction Oxygen Delivery Method: Circle system utilized Preoxygenation: Pre-oxygenation with 100% oxygen Induction Type: IV induction Ventilation: Mask ventilation without difficulty Laryngoscope Size: Mac and 3 Grade View: Grade I Tube type: Oral Tube size: 7.0 mm Number of attempts: 1 Airway Equipment and Method: Stylet and Oral airway Placement Confirmation: ETT inserted through vocal cords under direct vision,  positive ETCO2 and breath sounds checked- equal and bilateral Secured at: 22 cm Tube secured with: Tape Dental Injury: Teeth and Oropharynx as per pre-operative assessment  Comments: Intubated by C. Devenger, SRNA

## 2019-04-10 NOTE — Transfer of Care (Signed)
Immediate Anesthesia Transfer of Care Note  Patient: Cheryl Ayala  Procedure(s) Performed: XI ROBOTIC CHOLECYSTECTOMY (N/A Abdomen)  Patient Location: PACU  Anesthesia Type:General  Level of Consciousness: awake, alert  and oriented  Airway & Oxygen Therapy: Patient Spontanous Breathing and Patient connected to face mask oxygen  Post-op Assessment: Report given to RN and Post -op Vital signs reviewed and stable  Post vital signs: Reviewed and stable  Last Vitals:  Vitals Value Taken Time  BP 106/67 04/10/19 1305  Temp 36.7 C 04/10/19 1305  Pulse 75 04/10/19 1309  Resp 16 04/10/19 1309  SpO2 100 % 04/10/19 1309  Vitals shown include unvalidated device data.  Last Pain:  Vitals:   04/10/19 1305  TempSrc:   PainSc: Asleep         Complications: No apparent anesthesia complications

## 2019-04-10 NOTE — Interval H&P Note (Signed)
History and Physical Interval Note:  04/10/2019 10:14 AM  Cheryl Ayala  has presented today for surgery, with the diagnosis of BILIARY COLIC.  The various methods of treatment have been discussed with the patient and family. After consideration of risks, benefits and other options for treatment, the patient has consented to  Procedure(s): XI ROBOTIC VS LAPAROSCOPIC CHOLECYSTECTOMY (N/A) as a surgical intervention.  The patient's history has been reviewed, patient examined, no change in status, stable for surgery.  I have reviewed the patient's chart and labs.  Questions were answered to the patient's satisfaction.     Javyn Havlin Rich Brave

## 2019-04-10 NOTE — Discharge Instructions (Signed)
LAPAROSCOPIC SURGERY: POST OP INSTRUCTIONS  ######################################################################  EAT Gradually transition to a high fiber diet with a fiber supplement over the next few weeks after discharge.  Start with a pureed / full liquid diet (see below)  WALK Walk an hour a day.  Control your pain to do that.    CONTROL PAIN Control pain so that you can walk, sleep, tolerate sneezing/coughing, go up/down stairs.  HAVE A BOWEL MOVEMENT DAILY Keep your bowels regular to avoid problems.  OK to try a laxative to override constipation.  OK to use an antidairrheal to slow down diarrhea.  Call if not better after 2 tries  CALL IF YOU HAVE PROBLEMS/CONCERNS Call if you are still struggling despite following these instructions. Call if you have concerns not answered by these instructions  ######################################################################    1. DIET: Follow a light bland diet & liquids the first 24 hours after arrival home, such as soup, liquids, starches, etc.  Be sure to drink plenty of fluids.  Quickly advance to a usual solid diet within a few days.  Avoid fast food or heavy meals as your are more likely to get nauseated or have irregular bowels.  A low-sugar, high-fiber diet for the rest of your life is ideal.  2. Take your usually prescribed home medications unless otherwise directed.  3. PAIN CONTROL: a. Pain is best controlled by a usual combination of three different methods TOGETHER: i. Ice/Heat ii. Over the counter pain medication iii. Prescription pain medication b. Most patients will experience some swelling and bruising around the incisions.  Ice packs or heating pads (30-60 minutes up to 6 times a day) will help. Use ice for the first few days to help decrease swelling and bruising, then switch to heat to help relax tight/sore spots and speed recovery.  Some people prefer to use ice alone, heat alone, alternating between ice & heat.   Experiment to what works for you.  Swelling and bruising can take several weeks to resolve.   c. It is helpful to take an over-the-counter pain medication regularly for the first few days: i. Naproxen (Aleve, etc)  Two 220mg tabs twice a day OR Ibuprofen (Advil, etc) Three 200mg tabs four times a day (every meal & bedtime) AND ii. Acetaminophen (Tylenol, etc) 500-650mg four times a day (every meal & bedtime) d. A  prescription for pain medication (such as oxycodone, hydrocodone, tramadol, gabapentin, methocarbamol, etc) should be given to you upon discharge.  Take your pain medication as prescribed.  i. If you are having problems/concerns with the prescription medicine (does not control pain, nausea, vomiting, rash, itching, etc), please call us [phone] to see if we need to switch you to a different pain medicine that will work better for you and/or control your side effect better. ii. If you need a refill on your pain medication, please give us 48 hour notice.  contact your pharmacy.  They will contact our office to request authorization. Prescriptions will not be filled after 5 pm or on week-ends  4. Avoid getting constipated.   a. Between the surgery and the pain medications, it is common to experience some constipation.   b. Increasing fluid intake and taking a fiber supplement (such as Metamucil, Citrucel, FiberCon, MiraLax, etc) 1-2 times a day regularly will usually help prevent this problem from occurring.   c. A mild laxative (prune juice, Milk of Magnesia, MiraLax, etc) should be taken according to package directions if there are no bowel movements after 48   hours.   5. Watch out for diarrhea.   a. If you have many loose bowel movements, simplify your diet to bland foods & liquids for a few days.   b. Stop any stool softeners and decrease your fiber supplement.   c. Switching to mild anti-diarrheal medications (Kayopectate, Pepto Bismol) can help.   d. If this worsens or does not  improve, please call us.  6. Wash / shower every day.  You may shower over the skin glue which is waterproof.  Continue to shower over incision(s) after the dressing is off.  7. Glue will flake off after about 2 weeks.  You may leave the incision open to air.  You may replace a dressing/Band-Aid to cover the incision for comfort if you wish.   8. ACTIVITIES as tolerated:   a. You may resume regular (light) daily activities beginning the next day--such as daily self-care, walking, climbing stairs--gradually increasing activities as tolerated.  If you can walk 30 minutes without difficulty, it is safe to try more intense activity such as jogging, treadmill, bicycling, low-impact aerobics, swimming, etc. b. Save the most intensive and strenuous activity for last such as sit-ups, heavy lifting, contact sports, etc  Refrain from any heavy lifting or straining until you are off narcotics for pain control.   c. DO NOT PUSH THROUGH PAIN.  Let pain be your guide: If it hurts to do something, don't do it.  Pain is your body warning you to avoid that activity for another week until the pain goes down. d. You may drive when you are no longer taking prescription pain medication, you can comfortably wear a seatbelt, and you can safely maneuver your car and apply brakes. e. Dennis Bast may have sexual intercourse when it is comfortable.  9. FOLLOW UP in our office a. Please call [facility] at [phone] to set up an appointment to see your surgeon in the office for a follow-up appointment approximately 2-3 weeks after your surgery. b. Make sure that you call for this appointment the day you arrive home to insure a convenient appointment time.  10. IF YOU HAVE DISABILITY OR FAMILY LEAVE FORMS, BRING THEM TO THE OFFICE FOR PROCESSING.  DO NOT GIVE THEM TO YOUR DOCTOR.   WHEN TO CALL us [phone]: 1. Poor pain control 2. Reactions / problems with new medications (rash/itching, nausea, etc)  3. Fever over 101.5 F  (38.5 C) 4. Inability to urinate 5. Nausea and/or vomiting 6. Worsening swelling or bruising 7. Continued bleeding from incision. 8. Increased pain, redness, or drainage from the incision   The clinic staff is available to answer your questions during regular business hours (8:30am-5pm).  Please don't hesitate to call and ask to speak to one of our nurses for clinical concerns.   If you have a medical emergency, go to the nearest emergency room or call 911.  A surgeon from [facility] is always on call at the [facility], [place], [place], [place], [place]  [address] ? MAIN: [phone] ? TOLL FREE: [phone] ?  FAX [phone] [email or web address] General Anesthesia, Adult, Care After This sheet gives you information about how to care for yourself after your procedure. Your health care provider may also give you more specific instructions. If you have problems or questions, contact your health care provider. What can I expect after the procedure? After the procedure, the following side effects are common:  Pain or discomfort at the  IV site.  Nausea.  Vomiting.  Sore throat.  Trouble concentrating.  Feeling cold or chills.  Weak or tired.  Sleepiness and fatigue.  Soreness and body aches. These side effects can affect parts of the body that were not involved in surgery. Follow these instructions at home:  For at least 24 hours after the procedure:  Have a responsible adult stay with you. It is important to have someone help care for you until you are awake and alert.  Rest as needed.  Do not: ? Participate in activities in which you could fall or become injured. ? Drive. ? Use heavy machinery. ? Drink alcohol. ? Take sleeping pills or medicines that cause drowsiness. ? Make important decisions or sign legal documents. ? Take care of children on your own. Eating and drinking  Follow any  instructions from your health care provider about eating or drinking restrictions.  When you feel hungry, start by eating small amounts of foods that are soft and easy to digest (bland), such as toast. Gradually return to your regular diet.  Drink enough fluid to keep your urine pale yellow.  If you vomit, rehydrate by drinking water, juice, or clear broth. General instructions  If you have sleep apnea, surgery and certain medicines can increase your risk for breathing problems. Follow instructions from your health care provider about wearing your sleep device: ? Anytime you are sleeping, including during daytime naps. ? While taking prescription pain medicines, sleeping medicines, or medicines that make you drowsy.  Return to your normal activities as told by your health care provider. Ask your health care provider what activities are safe for you.  Take over-the-counter and prescription medicines only as told by your health care provider.  If you smoke, do not smoke without supervision.  Keep all follow-up visits as told by your health care provider. This is important. Contact a health care provider if:  You have nausea or vomiting that does not get better with medicine.  You cannot eat or drink without vomiting.  You have pain that does not get better with medicine.  You are unable to pass urine.  You develop a skin rash.  You have a fever.  You have redness around your IV site that gets worse. Get help right away if:  You have difficulty breathing.  You have chest pain.  You have blood in your urine or stool, or you vomit blood. Summary  After the procedure, it is common to have a sore throat or nausea. It is also common to feel tired.  Have a responsible adult stay with you for the first 24 hours after general anesthesia. It is important to have someone help care for you until you are awake and alert.  When you feel hungry, start by eating small amounts of foods  that are soft and easy to digest (bland), such as toast. Gradually return to your regular diet.  Drink enough fluid to keep your urine pale yellow.  Return to your normal activities as told by your health care provider. Ask your health care provider what activities are safe for you. This information is not intended to replace advice given to you by your health care provider. Make sure you discuss any questions you have with your health care provider. Document Revised: 01/04/2017 Document Reviewed: 08/17/2016 Elsevier Patient Education  [employer].

## 2019-04-10 NOTE — Op Note (Signed)
Operative Note  Cheryl Ayala 35 y.o. female [id number]  04/10/2019  Surgeon: Clovis Riley MD FACS  Assistant: Kaylyn Lim MD FACS  Procedure performed: [facility] Robotic Cholecystectomy  Preop diagnosis: biliary colic Post-op diagnosis/intraop findings: Chronic cholecystitis  Specimens: gallbladder  Retained items: none  EBL: minimal  Complications: none  Description of procedure: After obtaining informed consent the patient was brought to the operating room. Antibiotics were administered. SCD's were applied. General endotracheal anesthesia was initiated and a formal time-out was performed. The abdomen was prepped and draped in the usual sterile fashion and the abdomen was entered using a left upper quadrant veress needle after instilling the site with local. Insufflation to 86mmHg was obtained, 76mm periumbilical trocar and camera inserted, and gross inspection revealed no evidence of injury from our entry or other intraabdominal abnormalities. Three additional 74mm trocars were introduced under direct visualization and following infiltration with local.  The patient was placed in steep reverse Trendelenburg.  The robot was then docked and robotic instruments inserted under direct visualization. The gallbladder was retracted cephalad.  Omental adhesions were carefully lysed bluntly and with hook electrocautery until the infundibulum could be grasped and retracted laterally.  A combination of hook electrocautery and blunt dissection was utilized to clear the peritoneum from the neck and cystic duct, circumferentially isolating the cystic artery and cystic duct and lifting the gallbladder from the cystic plate. The critical view of safety was achieved with the cystic artery, cystic duct, and liver bed visualized between them with no other structures.  Firefly view was concordant.  The artery was clipped with a single clip proximally, cauterized distally and divided.  The cystic duct was ligated  with 3 clips proximally and 1 distally and then divided sharply.  The gallbladder was dissected from the liver plate using electrocautery. Once freed the gallbladder was placed in an endocatch bag and removed intact through the left upper quadrant trocar site, which did have to be extended due to the large stone in the gallbladder.  A small amount of bleeding on the liver bed was controlled with cautery. The right upper quadrant was irrigated and aspirated, the effluent was clear. Hemostasis was once again confirmed, and reinspection of the abdomen revealed no injuries. The clips were well opposed without any bile leak from the duct or the liver bed on direct visualization nor with firefly. The left upper quadrant trocar site was closed with a 0 vicryl in the fascia under direct visualization using a PMI device. The abdomen was desufflated and all trocars removed. The skin incisions were closed with subcuticular monocryl and Dermabond. The patient was awakened, extubated and transported to the recovery room in stable condition.    All counts were correct at the completion of the case.

## 2019-04-13 LAB — SURGICAL PATHOLOGY

## 2019-04-23 ENCOUNTER — Encounter: Payer: Self-pay | Admitting: Nurse Practitioner

## 2019-04-23 DIAGNOSIS — F909 Attention-deficit hyperactivity disorder, unspecified type: Secondary | ICD-10-CM

## 2019-04-23 DIAGNOSIS — Z79899 Other long term (current) drug therapy: Secondary | ICD-10-CM

## 2019-04-23 DIAGNOSIS — G4719 Other hypersomnia: Secondary | ICD-10-CM

## 2019-04-23 MED ORDER — DEXTROAMPHETAMINE SULFATE ER 15 MG PO CP24: 15.0000 mg | ORAL_CAPSULE | Freq: Every day | ORAL | 0 refills | Status: DC

## 2019-04-23 MED FILL — D-AMPHETAMINE ER 15 MG CAP: 15 | 30 days supply | Qty: 30 | Fill #0

## 2019-05-12 MED FILL — OMEPRAZOLE 40 MG CPDR: 40 | 45 days supply | Qty: 90 | Fill #2

## 2019-05-27 ENCOUNTER — Encounter: Payer: Self-pay | Admitting: Nurse Practitioner

## 2019-05-27 DIAGNOSIS — G4719 Other hypersomnia: Secondary | ICD-10-CM

## 2019-05-27 DIAGNOSIS — F909 Attention-deficit hyperactivity disorder, unspecified type: Secondary | ICD-10-CM

## 2019-05-27 DIAGNOSIS — Z79899 Other long term (current) drug therapy: Secondary | ICD-10-CM

## 2019-05-29 MED ORDER — DEXTROAMPHETAMINE SULFATE ER 15 MG PO CP24: 15.0000 mg | ORAL_CAPSULE | Freq: Every day | ORAL | 0 refills | Status: DC

## 2019-06-08 MED FILL — PREGNYL 10,000 UNITS VIAL: 10000 | 30 days supply | Qty: 1 | Fill #0

## 2019-06-16 MED FILL — AMLODIPINE BESYLATE 5 MG TA: 5 | 90 days supply | Qty: 90 | Fill #1

## 2019-06-19 MED FILL — DOXYCYCLINE HYCLATE 100 MG: 100 | 5 days supply | Qty: 10 | Fill #0

## 2019-06-19 MED FILL — DIAZEPAM 10 MG TABS: 10 | 1 days supply | Qty: 1 | Fill #0

## 2019-06-19 MED FILL — traMADol HCL 50 MG TABS: 50 | 2 days supply | Qty: 10 | Fill #0

## 2019-07-09 ENCOUNTER — Encounter: Payer: Self-pay | Admitting: Nurse Practitioner

## 2019-07-10 MED FILL — DOTTI 0.1 MG/24HR PTTW: 0.1 | 30 days supply | Qty: 8 | Fill #1

## 2019-07-13 ENCOUNTER — Other Ambulatory Visit: Payer: Self-pay

## 2019-07-13 ENCOUNTER — Encounter: Payer: Self-pay | Admitting: Nurse Practitioner

## 2019-07-13 ENCOUNTER — Ambulatory Visit (INDEPENDENT_AMBULATORY_CARE_PROVIDER_SITE_OTHER): Payer: No Typology Code available for payment source | Admitting: Nurse Practitioner

## 2019-07-13 VITALS — BP 120/70 | HR 81 | Temp 96.8°F | Ht 71.0 in | Wt 311.6 lb

## 2019-07-13 DIAGNOSIS — R739 Hyperglycemia, unspecified: Secondary | ICD-10-CM

## 2019-07-13 DIAGNOSIS — I10 Essential (primary) hypertension: Secondary | ICD-10-CM

## 2019-07-13 DIAGNOSIS — Z79899 Other long term (current) drug therapy: Secondary | ICD-10-CM

## 2019-07-13 DIAGNOSIS — G4719 Other hypersomnia: Secondary | ICD-10-CM

## 2019-07-13 DIAGNOSIS — F909 Attention-deficit hyperactivity disorder, unspecified type: Secondary | ICD-10-CM

## 2019-07-13 DIAGNOSIS — E782 Mixed hyperlipidemia: Secondary | ICD-10-CM

## 2019-07-13 LAB — COMPREHENSIVE METABOLIC PANEL
ALT: 13 U/L (ref 0–35)
AST: 12 U/L (ref 0–37)
Albumin: 3.8 g/dL (ref 3.5–5.2)
Alkaline Phosphatase: 51 U/L (ref 39–117)
BUN: 11 mg/dL (ref 6–23)
CO2: 25 mEq/L (ref 19–32)
Calcium: 8.9 mg/dL (ref 8.4–10.5)
Chloride: 103 mEq/L (ref 96–112)
Creatinine, Ser: 0.82 mg/dL (ref 0.40–1.20)
GFR: 79.3 mL/min (ref >60.00)
Glucose, Bld: 121 mg/dL — ABNORMAL HIGH (ref 70–99)
Potassium: 3.8 mEq/L (ref 3.5–5.1)
Sodium: 139 mEq/L (ref 135–145)
Total Bilirubin: 0.3 mg/dL (ref 0.2–1.2)
Total Protein: 6.3 g/dL (ref 6.0–8.3)

## 2019-07-13 LAB — LIPID PANEL
Cholesterol: 190 mg/dL (ref 0–200)
HDL: 48.8 mg/dL (ref >39.00)
LDL Cholesterol: 118 mg/dL — ABNORMAL HIGH (ref 0–99)
NonHDL: 141.41
Total CHOL/HDL Ratio: 4
Triglycerides: 115 mg/dL (ref 0.0–149.0)
VLDL: 23 mg/dL (ref 0.0–40.0)

## 2019-07-13 LAB — TSH: TSH: 2.75 u[IU]/mL (ref 0.35–4.50)

## 2019-07-13 LAB — HEMOGLOBIN A1C: Hgb A1c MFr Bld: 6.2 % (ref 4.6–6.5)

## 2019-07-13 NOTE — Patient Instructions (Signed)
Go to lab for blood draw.

## 2019-07-13 NOTE — Progress Notes (Signed)
Subjective:  Patient ID: Cheryl Ayala, female    DOB: 1985/10/08  Age: 34 y.o. MRN: [id number]  CC: Follow-up (3 mo for HTN and ADD//pt states doesn't check BP--asking for refill for ADD meds until shes pregnant//no covid//no tetanus//reports taking 1 whole metform a day instead of halves)  HPI ADD:  last filled 05/29/2019, #30, uptodate with UDS and contract She is schedule for artificial insemination in 2weeks.  HTN: BP at goal BP Readings from Last 3 Encounters:  07/13/19 120/70  04/10/19 121/78  04/07/19 (!) 142/94   Hyperlipidemia: No exercise, no change in diet Declines use of any Statin medication Depression screen [facility] 2/9 07/13/2019 07/09/2018 04/08/2018  Decreased Interest 1 1 0  Down, Depressed, Hopeless 1 0 0  PHQ - 2 Score 2 1 0  Altered sleeping 1 0 -  Tired, decreased energy 2 3 -  Change in appetite 2 0 -  Feeling bad or failure about yourself  1 0 -  Trouble concentrating 2 0 -  Moving slowly or fidgety/restless 0 0 -  Suicidal thoughts 0 0 -  PHQ-9 Score 10 4 -   GAD 7 : Generalized Anxiety Score 07/13/2019 07/09/2018  Nervous, Anxious, on Edge 3 2  Control/stop worrying 1 2  Worry too much - different things 2 2  Trouble relaxing 1 1  Restless 0 0  Easily annoyed or irritable 2 2  Afraid - awful might happen 0 0  Total GAD 7 Score 9 9    Reviewed past Medical, Social and Family history today.  Outpatient Medications Prior to Visit  Medication Sig Dispense Refill  . amLODipine (NORVASC) 5 MG tablet TAKE 1 TABLET (5 MG TOTAL) BY MOUTH DAILY. 90 tablet 3  . estradiol (ESTRACE) 2 MG tablet Take 2 mg by mouth 2 (two) times daily.    Marland Kitchen ibuprofen (ADVIL) 200 MG tablet Take 600-800 mg by mouth every 8 (eight) hours as needed (for pain.).    Marland Kitchen labetalol (NORMODYNE) 200 MG tablet Take 1 tablet (200 mg total) by mouth 2 (two) times daily. 180 tablet 3  . metFORMIN (GLUCOPHAGE) 1000 MG tablet DAYS 1-3:TAKE 1/2 TAB IN THE AM, DAYS 4-6: 1/2 TAB IN AM AND 1/2 TAB  IN PM, DAYS 7-9: 1 TAB IN AM AND 1/2 TAB IN PM, THEN TAKE 1 TAB IN AM A    . omeprazole (PRILOSEC) 40 MG capsule Take 1 capsule (40 mg total) by mouth 2 (two) times daily before a meal. (Patient taking differently: Take 40 mg by mouth See admin instructions. Take 1 capsule (40 mg) by mouth scheduled every morning & take 1 capsule (20 mg) by mouth in the evening if needed for acid reflux/indigestion.) 90 capsule 3  . traMADol (ULTRAM) 50 MG tablet Take 1 tablet (50 mg total) by mouth every 6 (six) hours as needed. 20 tablet 0  . dextroamphetamine (DEXEDRINE SPANSULE) 15 MG 24 hr capsule Take 1 capsule (15 mg total) by mouth daily. 30 capsule 0  . progesterone 50 MG/ML injection Inject 50 mg into the muscle daily. (Patient not taking: Reported on 07/13/2019)     No facility-administered medications prior to visit.    ROS See HPI  Objective:  BP 120/70   Pulse 81   Temp (!) 96.8 F (36 C) (Tympanic)   Ht 5\' 11"  (1.803 m)   Wt (!) 311 lb 9.6 oz (141.3 kg)   SpO2 98%   BMI 43.46 kg/m   BP Readings from Last 3 Encounters:  07/13/19 120/70  04/10/19 121/78  04/07/19 (!) 142/94    Wt Readings from Last 3 Encounters:  07/13/19 (!) 311 lb 9.6 oz (141.3 kg)  04/10/19 (!) 309 lb 15.5 oz (140.6 kg)  04/07/19 (!) 310 lb (140.6 kg)    Physical Exam Constitutional:      Appearance: She is obese.  Cardiovascular:     Rate and Rhythm: Normal rate and regular rhythm.     Pulses: Normal pulses.     Heart sounds: Normal heart sounds.  Pulmonary:     Effort: Pulmonary effort is normal.     Breath sounds: Normal breath sounds.  Musculoskeletal:     Right lower leg: Edema present.     Left lower leg: Edema present.  Neurological:     Mental Status: She is alert and oriented to person, place, and time.    Lab Results  Component Value Date   WBC 9.4 04/07/2019   HGB 11.7 (L) 04/07/2019   HCT 36.9 04/07/2019   PLT 402 (H) 04/07/2019   GLUCOSE 121 (H) 07/13/2019   CHOL 190 07/13/2019    TRIG 115.0 07/13/2019   HDL 48.80 07/13/2019   LDLCALC 118 (H) 07/13/2019   ALT 13 07/13/2019   AST 12 07/13/2019   NA 139 07/13/2019   K 3.8 07/13/2019   CL 103 07/13/2019   CREATININE 0.82 07/13/2019   BUN 11 07/13/2019   CO2 25 07/13/2019   TSH 2.75 07/13/2019   HGBA1C 6.2 07/13/2019    Assessment & Plan:  This visit occurred during the SARS-CoV-2 public health emergency.  Safety protocols were in place, including screening questions prior to the visit, additional usage of staff PPE, and extensive cleaning of exam room while observing appropriate contact time as indicated for disinfecting solutions.   Cheryl Ayala was seen today for follow-up.  Diagnoses and all orders for this visit:  Benign essential hypertension -     Comprehensive metabolic panel -     TSH  Adult ADHD -     dextroamphetamine (DEXEDRINE SPANSULE) 15 MG 24 hr capsule; Take 1 capsule (15 mg total) by mouth daily.  Mixed hyperlipidemia -     Lipid panel -     Comprehensive metabolic panel  Hyperglycemia -     Hemoglobin A1c  Excessive daytime sleepiness -     dextroamphetamine (DEXEDRINE SPANSULE) 15 MG 24 hr capsule; Take 1 capsule (15 mg total) by mouth daily.  Encounter for medication management in attention deficit hyperactivity disorder (ADHD) -     dextroamphetamine (DEXEDRINE SPANSULE) 15 MG 24 hr capsule; Take 1 capsule (15 mg total) by mouth daily.   I am having Cheryl Ayala maintain her omeprazole, labetalol, amLODipine, ibuprofen, traMADol, metFORMIN, estradiol, progesterone, and dextroamphetamine.  Meds ordered this encounter  Medications  . dextroamphetamine (DEXEDRINE SPANSULE) 15 MG 24 hr capsule    Sig: Take 1 capsule (15 mg total) by mouth daily.    Dispense:  30 capsule    Refill:  0    Order Specific Question:   Supervising Provider    Answer:   Ronnald Nian [[id number]]    Problem List Items Addressed This Visit      Cardiovascular and Mediastinum   Benign essential  hypertension - Primary    Stable BP with persistent LE edema. BP Readings from Last 3 Encounters:  07/13/19 120/70  04/10/19 121/78  04/07/19 (!) 142/94        Relevant Orders   Comprehensive metabolic panel (Completed)  TSH (Completed)     Other   Adult ADHD    Up to date with UDS and contract PMP database reviewed and no red flags. Continue dexedrine She plans to d/c medication once pregnancy is confirmed      Relevant Medications   dextroamphetamine (DEXEDRINE SPANSULE) 15 MG 24 hr capsule   Excessive daytime sleepiness   Relevant Medications   dextroamphetamine (DEXEDRINE SPANSULE) 15 MG 24 hr capsule   Hyperglycemia    Metformin started by fertility clinic Last HgbA1c of 6.2      Relevant Orders   Hemoglobin A1c (Completed)   Mixed hyperlipidemia    She is no interested in use of any statin.  Lipid Panel     Component Value Date/Time   CHOL 190 07/13/2019 0907   TRIG 115.0 07/13/2019 0907   HDL 48.80 07/13/2019 0907   CHOLHDL 4 07/13/2019 0907   VLDL 23.0 07/13/2019 0907   LDLCALC 118 (H) 07/13/2019 0907        Relevant Orders   Lipid panel (Completed)   Comprehensive metabolic panel (Completed)    Other Visit Diagnoses    Encounter for medication management in attention deficit hyperactivity disorder (ADHD)       Relevant Medications   dextroamphetamine (DEXEDRINE SPANSULE) 15 MG 24 hr capsule      Follow-up: Return in about 3 months (around 10/13/2019) for HTN and ADD (video, 56mins).  Wilfred Lacy, NP

## 2019-07-14 MED ORDER — DEXTROAMPHETAMINE SULFATE ER 15 MG PO CP24: 15.0000 mg | ORAL_CAPSULE | Freq: Every day | ORAL | 0 refills | Status: DC

## 2019-07-14 MED FILL — D-AMPHETAMINE ER 15 MG CAP: 15 | 30 days supply | Qty: 30 | Fill #0

## 2019-07-16 NOTE — Assessment & Plan Note (Signed)
Metformin started by fertility clinic Last HgbA1c of 6.2

## 2019-07-16 NOTE — Assessment & Plan Note (Signed)
Up to date with UDS and contract PMP database reviewed and no red flags. Continue dexedrine She plans to d/c medication once pregnancy is confirmed

## 2019-07-16 NOTE — Assessment & Plan Note (Signed)
>>  ASSESSMENT AND PLAN FOR BENIGN ESSENTIAL HYPERTENSION WRITTEN ON 07/16/2019  3:47 PM BY Cheyann Blecha LUM, NP  Stable BP with persistent LE edema. BP Readings from Last 3 Encounters:  07/13/19 120/70  04/10/19 121/78  04/07/19 (!) 142/94

## 2019-07-16 NOTE — Assessment & Plan Note (Signed)
She is no interested in use of any statin.  Lipid Panel     Component Value Date/Time   CHOL 190 07/13/2019 0907   TRIG 115.0 07/13/2019 0907   HDL 48.80 07/13/2019 0907   CHOLHDL 4 07/13/2019 0907   VLDL 23.0 07/13/2019 0907   LDLCALC 118 (H) 07/13/2019 3344

## 2019-07-16 NOTE — Assessment & Plan Note (Signed)
Stable BP with persistent LE edema. BP Readings from Last 3 Encounters:  07/13/19 120/70  04/10/19 121/78  04/07/19 (!) 142/94

## 2019-08-04 MED FILL — DOTTI 0.1 MG/24HR PTTW: 0.1 | 30 days supply | Qty: 8 | Fill #2

## 2019-08-04 MED FILL — METFORMIN HCL 1000 MG TABS: 1000 | 30 days supply | Qty: 60 | Fill #1

## 2019-08-04 MED FILL — ESTRADIOL 2 MG TABS: 2 | 30 days supply | Qty: 60 | Fill #1

## 2019-08-12 MED FILL — PROGESTERONE OIL 50 MG/ML V: 50 | 30 days supply | Qty: 30 | Fill #1

## 2019-08-12 MED FILL — BD NEEDLE 18GX1 1/2: 18G X 1-1/2 | 30 days supply | Qty: 60 | Fill #0

## 2019-08-12 MED FILL — BD 3 ML SYR/NDLE 22G/1-1.5: 22G X 1-1/2 | 30 days supply | Qty: 30 | Fill #0

## 2019-08-14 MED FILL — FOLIC ACID 1 MG TABS: 1 | 30 days supply | Qty: 30 | Fill #0

## 2019-08-31 MED FILL — OMEPRAZOLE 40 MG CPDR: 40 | 45 days supply | Qty: 90 | Fill #3

## 2019-09-01 MED FILL — PROGESTERONE 200 MG CAPS: 200 | 14 days supply | Qty: 14 | Fill #0

## 2019-09-25 MED FILL — AZITHROMYCIN 250 MG TABLET: 250 | 5 days supply | Qty: 6 | Fill #0

## 2019-09-25 MED FILL — FOLIC ACID 1 MG TABS: 1 | 30 days supply | Qty: 30 | Fill #0

## 2019-09-25 MED FILL — LABETALOL HCL 200 MG TABS: 200 | 90 days supply | Qty: 180 | Fill #1

## 2019-09-28 ENCOUNTER — Other Ambulatory Visit: Payer: Self-pay | Admitting: Obstetrics & Gynecology

## 2019-09-28 DIAGNOSIS — Z3689 Encounter for other specified antenatal screening: Secondary | ICD-10-CM

## 2019-09-28 DIAGNOSIS — O30001 Twin pregnancy, unspecified number of placenta and unspecified number of amniotic sacs, first trimester: Secondary | ICD-10-CM

## 2019-09-28 DIAGNOSIS — O30009 Twin pregnancy, unspecified number of placenta and unspecified number of amniotic sacs, unspecified trimester: Secondary | ICD-10-CM

## 2019-10-06 MED FILL — SERTRALINE HCL 50 MG TABLET: 50 | 30 days supply | Qty: 30 | Fill #0

## 2019-10-06 MED FILL — LABETALOL HCL 300 MG TABS: 300 | 30 days supply | Qty: 120 | Fill #0

## 2019-11-10 MED FILL — LABETALOL HCL 300 MG TABS: 300 | 30 days supply | Qty: 120 | Fill #1

## 2019-11-11 ENCOUNTER — Encounter: Payer: Self-pay | Admitting: *Deleted

## 2019-11-16 ENCOUNTER — Other Ambulatory Visit: Payer: Self-pay | Admitting: Obstetrics & Gynecology

## 2019-11-16 ENCOUNTER — Ambulatory Visit: Payer: No Typology Code available for payment source | Admitting: *Deleted

## 2019-11-16 ENCOUNTER — Ambulatory Visit: Payer: No Typology Code available for payment source | Attending: Obstetrics & Gynecology

## 2019-11-16 ENCOUNTER — Ambulatory Visit: Payer: No Typology Code available for payment source

## 2019-11-16 ENCOUNTER — Other Ambulatory Visit: Payer: Self-pay

## 2019-11-16 ENCOUNTER — Encounter: Payer: Self-pay | Admitting: *Deleted

## 2019-11-16 VITALS — BP 138/84 | HR 86

## 2019-11-16 DIAGNOSIS — O30001 Twin pregnancy, unspecified number of placenta and unspecified number of amniotic sacs, first trimester: Secondary | ICD-10-CM

## 2019-11-16 DIAGNOSIS — O30009 Twin pregnancy, unspecified number of placenta and unspecified number of amniotic sacs, unspecified trimester: Secondary | ICD-10-CM

## 2019-11-16 DIAGNOSIS — O09512 Supervision of elderly primigravida, second trimester: Secondary | ICD-10-CM | POA: Insufficient documentation

## 2019-11-16 DIAGNOSIS — Z3689 Encounter for other specified antenatal screening: Secondary | ICD-10-CM | POA: Diagnosis not present

## 2019-11-17 ENCOUNTER — Other Ambulatory Visit: Payer: Self-pay | Admitting: *Deleted

## 2019-11-17 ENCOUNTER — Other Ambulatory Visit: Payer: Self-pay

## 2019-11-17 DIAGNOSIS — Z362 Encounter for other antenatal screening follow-up: Secondary | ICD-10-CM

## 2019-11-18 MED FILL — SERTRALINE HCL 50 MG TABLET: 50 | 30 days supply | Qty: 30 | Fill #1

## 2019-11-27 MED FILL — SERTRALINE HCL 50 MG TABLET: 50 | 30 days supply | Qty: 30 | Fill #1

## 2019-11-27 MED FILL — LABETALOL HCL 300 MG TABS: 300 | 30 days supply | Qty: 180 | Fill #0

## 2019-11-30 ENCOUNTER — Ambulatory Visit: Payer: No Typology Code available for payment source

## 2019-11-30 ENCOUNTER — Ambulatory Visit: Payer: No Typology Code available for payment source | Attending: Obstetrics and Gynecology | Admitting: *Deleted

## 2019-11-30 ENCOUNTER — Encounter: Payer: Self-pay | Admitting: *Deleted

## 2019-11-30 ENCOUNTER — Ambulatory Visit (HOSPITAL_BASED_OUTPATIENT_CLINIC_OR_DEPARTMENT_OTHER): Payer: No Typology Code available for payment source

## 2019-11-30 ENCOUNTER — Other Ambulatory Visit: Payer: Self-pay

## 2019-11-30 VITALS — BP 136/81 | HR 90

## 2019-11-30 DIAGNOSIS — O99212 Obesity complicating pregnancy, second trimester: Secondary | ICD-10-CM

## 2019-11-30 DIAGNOSIS — Z148 Genetic carrier of other disease: Secondary | ICD-10-CM

## 2019-11-30 DIAGNOSIS — E669 Obesity, unspecified: Secondary | ICD-10-CM | POA: Insufficient documentation

## 2019-11-30 DIAGNOSIS — Z362 Encounter for other antenatal screening follow-up: Secondary | ICD-10-CM | POA: Insufficient documentation

## 2019-11-30 DIAGNOSIS — O30032 Twin pregnancy, monochorionic/diamniotic, second trimester: Secondary | ICD-10-CM

## 2019-11-30 DIAGNOSIS — O09812 Supervision of pregnancy resulting from assisted reproductive technology, second trimester: Secondary | ICD-10-CM | POA: Insufficient documentation

## 2019-11-30 DIAGNOSIS — O10012 Pre-existing essential hypertension complicating pregnancy, second trimester: Secondary | ICD-10-CM | POA: Insufficient documentation

## 2019-11-30 DIAGNOSIS — O10919 Unspecified pre-existing hypertension complicating pregnancy, unspecified trimester: Secondary | ICD-10-CM

## 2019-11-30 DIAGNOSIS — O09522 Supervision of elderly multigravida, second trimester: Secondary | ICD-10-CM | POA: Insufficient documentation

## 2019-11-30 DIAGNOSIS — Z3A21 21 weeks gestation of pregnancy: Secondary | ICD-10-CM

## 2019-12-14 ENCOUNTER — Ambulatory Visit: Payer: No Typology Code available for payment source

## 2019-12-21 ENCOUNTER — Ambulatory Visit (HOSPITAL_BASED_OUTPATIENT_CLINIC_OR_DEPARTMENT_OTHER): Payer: No Typology Code available for payment source

## 2019-12-21 ENCOUNTER — Ambulatory Visit: Payer: No Typology Code available for payment source | Attending: Obstetrics and Gynecology | Admitting: *Deleted

## 2019-12-21 ENCOUNTER — Other Ambulatory Visit: Payer: Self-pay

## 2019-12-21 ENCOUNTER — Encounter: Payer: Self-pay | Admitting: *Deleted

## 2019-12-21 VITALS — BP 135/81 | HR 98

## 2019-12-21 DIAGNOSIS — Z362 Encounter for other antenatal screening follow-up: Secondary | ICD-10-CM

## 2019-12-21 DIAGNOSIS — O10012 Pre-existing essential hypertension complicating pregnancy, second trimester: Secondary | ICD-10-CM | POA: Diagnosis not present

## 2019-12-21 DIAGNOSIS — O09522 Supervision of elderly multigravida, second trimester: Secondary | ICD-10-CM

## 2019-12-21 DIAGNOSIS — O10019 Pre-existing essential hypertension complicating pregnancy, unspecified trimester: Secondary | ICD-10-CM | POA: Diagnosis not present

## 2019-12-21 DIAGNOSIS — Z3A24 24 weeks gestation of pregnancy: Secondary | ICD-10-CM

## 2019-12-21 DIAGNOSIS — O30032 Twin pregnancy, monochorionic/diamniotic, second trimester: Secondary | ICD-10-CM | POA: Diagnosis not present

## 2019-12-21 DIAGNOSIS — O99212 Obesity complicating pregnancy, second trimester: Secondary | ICD-10-CM | POA: Diagnosis not present

## 2019-12-21 DIAGNOSIS — O10912 Unspecified pre-existing hypertension complicating pregnancy, second trimester: Secondary | ICD-10-CM

## 2019-12-21 DIAGNOSIS — Z148 Genetic carrier of other disease: Secondary | ICD-10-CM

## 2019-12-21 DIAGNOSIS — O09812 Supervision of pregnancy resulting from assisted reproductive technology, second trimester: Secondary | ICD-10-CM

## 2019-12-28 ENCOUNTER — Encounter: Payer: Self-pay | Admitting: Nurse Practitioner

## 2019-12-28 MED FILL — LABETALOL HCL 300 MG TABS: 300 | 30 days supply | Qty: 180 | Fill #1

## 2020-01-04 ENCOUNTER — Inpatient Hospital Stay (HOSPITAL_COMMUNITY): Payer: No Typology Code available for payment source

## 2020-01-04 ENCOUNTER — Encounter (HOSPITAL_COMMUNITY): Payer: Self-pay | Admitting: Obstetrics and Gynecology

## 2020-01-04 ENCOUNTER — Inpatient Hospital Stay (HOSPITAL_COMMUNITY)
Admission: AD | Admit: 2020-01-04 | Discharge: 2020-01-28 | DRG: 783 | Disposition: A | Discharge status: Home / Self Care | Payer: No Typology Code available for payment source | Attending: Obstetrics & Gynecology | Admitting: Obstetrics & Gynecology

## 2020-01-04 ENCOUNTER — Other Ambulatory Visit: Payer: Self-pay

## 2020-01-04 ENCOUNTER — Other Ambulatory Visit (HOSPITAL_COMMUNITY): Payer: Self-pay | Admitting: Obstetrics & Gynecology

## 2020-01-04 DIAGNOSIS — Z3A27 27 weeks gestation of pregnancy: Secondary | ICD-10-CM

## 2020-01-04 DIAGNOSIS — O99344 Other mental disorders complicating childbirth: Secondary | ICD-10-CM | POA: Diagnosis present

## 2020-01-04 DIAGNOSIS — O10012 Pre-existing essential hypertension complicating pregnancy, second trimester: Secondary | ICD-10-CM

## 2020-01-04 DIAGNOSIS — Z3A26 26 weeks gestation of pregnancy: Secondary | ICD-10-CM

## 2020-01-04 DIAGNOSIS — O42912 Preterm premature rupture of membranes, unspecified as to length of time between rupture and onset of labor, second trimester: Secondary | ICD-10-CM | POA: Diagnosis present

## 2020-01-04 DIAGNOSIS — Z302 Encounter for sterilization: Secondary | ICD-10-CM

## 2020-01-04 DIAGNOSIS — O30039 Twin pregnancy, monochorionic/diamniotic, unspecified trimester: Secondary | ICD-10-CM

## 2020-01-04 DIAGNOSIS — O30032 Twin pregnancy, monochorionic/diamniotic, second trimester: Secondary | ICD-10-CM

## 2020-01-04 DIAGNOSIS — N390 Urinary tract infection, site not specified: Secondary | ICD-10-CM | POA: Diagnosis not present

## 2020-01-04 DIAGNOSIS — K219 Gastro-esophageal reflux disease without esophagitis: Secondary | ICD-10-CM | POA: Diagnosis present

## 2020-01-04 DIAGNOSIS — O1002 Pre-existing essential hypertension complicating childbirth: Secondary | ICD-10-CM | POA: Diagnosis present

## 2020-01-04 DIAGNOSIS — O09812 Supervision of pregnancy resulting from assisted reproductive technology, second trimester: Secondary | ICD-10-CM

## 2020-01-04 DIAGNOSIS — O42919 Preterm premature rupture of membranes, unspecified as to length of time between rupture and onset of labor, unspecified trimester: Secondary | ICD-10-CM | POA: Diagnosis present

## 2020-01-04 DIAGNOSIS — O99214 Obesity complicating childbirth: Secondary | ICD-10-CM | POA: Diagnosis present

## 2020-01-04 DIAGNOSIS — O9081 Anemia of the puerperium: Secondary | ICD-10-CM | POA: Diagnosis not present

## 2020-01-04 DIAGNOSIS — O42913 Preterm premature rupture of membranes, unspecified as to length of time between rupture and onset of labor, third trimester: Secondary | ICD-10-CM | POA: Diagnosis not present

## 2020-01-04 DIAGNOSIS — Z148 Genetic carrier of other disease: Secondary | ICD-10-CM

## 2020-01-04 DIAGNOSIS — O288 Other abnormal findings on antenatal screening of mother: Secondary | ICD-10-CM

## 2020-01-04 DIAGNOSIS — O4103X1 Oligohydramnios, third trimester, fetus 1: Secondary | ICD-10-CM | POA: Diagnosis not present

## 2020-01-04 DIAGNOSIS — D62 Acute posthemorrhagic anemia: Secondary | ICD-10-CM | POA: Diagnosis not present

## 2020-01-04 DIAGNOSIS — O99213 Obesity complicating pregnancy, third trimester: Secondary | ICD-10-CM | POA: Diagnosis not present

## 2020-01-04 DIAGNOSIS — O4290 Premature rupture of membranes, unspecified as to length of time between rupture and onset of labor, unspecified weeks of gestation: Secondary | ICD-10-CM

## 2020-01-04 DIAGNOSIS — O24425 Gestational diabetes mellitus in childbirth, controlled by oral hypoglycemic drugs: Secondary | ICD-10-CM | POA: Diagnosis present

## 2020-01-04 DIAGNOSIS — Z8759 Personal history of other complications of pregnancy, childbirth and the puerperium: Secondary | ICD-10-CM

## 2020-01-04 DIAGNOSIS — Z20822 Contact with and (suspected) exposure to covid-19: Secondary | ICD-10-CM | POA: Diagnosis present

## 2020-01-04 DIAGNOSIS — O99212 Obesity complicating pregnancy, second trimester: Secondary | ICD-10-CM | POA: Diagnosis not present

## 2020-01-04 DIAGNOSIS — O4292 Full-term premature rupture of membranes, unspecified as to length of time between rupture and onset of labor: Secondary | ICD-10-CM | POA: Diagnosis not present

## 2020-01-04 DIAGNOSIS — E669 Obesity, unspecified: Secondary | ICD-10-CM | POA: Diagnosis not present

## 2020-01-04 DIAGNOSIS — O10013 Pre-existing essential hypertension complicating pregnancy, third trimester: Secondary | ICD-10-CM | POA: Diagnosis not present

## 2020-01-04 DIAGNOSIS — Z880 Allergy status to penicillin: Secondary | ICD-10-CM | POA: Diagnosis not present

## 2020-01-04 DIAGNOSIS — O9962 Diseases of the digestive system complicating childbirth: Secondary | ICD-10-CM | POA: Diagnosis present

## 2020-01-04 DIAGNOSIS — O30033 Twin pregnancy, monochorionic/diamniotic, third trimester: Secondary | ICD-10-CM | POA: Diagnosis not present

## 2020-01-04 DIAGNOSIS — Z3A28 28 weeks gestation of pregnancy: Secondary | ICD-10-CM

## 2020-01-04 DIAGNOSIS — O09522 Supervision of elderly multigravida, second trimester: Secondary | ICD-10-CM

## 2020-01-04 DIAGNOSIS — F419 Anxiety disorder, unspecified: Secondary | ICD-10-CM | POA: Diagnosis present

## 2020-01-04 LAB — RESP PANEL BY RT-PCR (FLU A&B, COVID) ARPGX2
Influenza A by PCR: NEGATIVE
Influenza B by PCR: NEGATIVE
SARS Coronavirus 2 by RT PCR: NEGATIVE

## 2020-01-04 LAB — CBC
HCT: 32 % — ABNORMAL LOW (ref 36.0–46.0)
Hemoglobin: 9.8 g/dL — ABNORMAL LOW (ref 12.0–15.0)
MCH: 23.1 pg — ABNORMAL LOW (ref 26.0–34.0)
MCHC: 30.6 g/dL (ref 30.0–36.0)
MCV: 75.3 fL — ABNORMAL LOW (ref 80.0–100.0)
Platelets: 410 10*3/uL — ABNORMAL HIGH (ref 150–400)
RBC: 4.25 MIL/uL (ref 3.87–5.11)
RDW: 14.9 % (ref 11.5–15.5)
WBC: 16.1 10*3/uL — ABNORMAL HIGH (ref 4.0–10.5)
nRBC: 0.2 % (ref 0.0–0.2)

## 2020-01-04 LAB — TYPE AND SCREEN
ABO/RH(D): O POS
Antibody Screen: NEGATIVE

## 2020-01-04 LAB — WET PREP, GENITAL
Clue Cells Wet Prep HPF POC: NONE SEEN
Sperm: NONE SEEN
Trich, Wet Prep: NONE SEEN
Yeast Wet Prep HPF POC: NONE SEEN

## 2020-01-04 MED ORDER — CEFAZOLIN SODIUM-DEXTROSE 1-4 GM/50ML-% IV SOLN
1.0000 g | Freq: Three times a day (TID) | INTRAVENOUS | Status: AC
  Administered 2020-01-04 – 2020-01-06 (×6): 1 g via INTRAVENOUS
  Filled 2020-01-04 (×7): qty 50

## 2020-01-04 MED ORDER — LABETALOL HCL 200 MG PO TABS
800.0000 mg | ORAL_TABLET | Freq: Three times a day (TID) | ORAL | Status: DC
Start: 2020-01-04 — End: 2020-01-14
  Administered 2020-01-04 – 2020-01-14 (×29): 800 mg via ORAL
  Filled 2020-01-04 (×25): qty 4
  Filled 2020-01-04: qty 8
  Filled 2020-01-04 (×4): qty 4

## 2020-01-04 MED ORDER — AZITHROMYCIN 250 MG PO TABS
1000.0000 mg | ORAL_TABLET | Freq: Once | ORAL | Status: AC
  Administered 2020-01-04: 1000 mg via ORAL
  Filled 2020-01-04: qty 4

## 2020-01-04 MED ORDER — LACTATED RINGERS IV SOLN: INTRAVENOUS | Status: AC

## 2020-01-04 MED ORDER — CALCIUM CARBONATE ANTACID 500 MG PO CHEW: 2.0000 | CHEWABLE_TABLET | ORAL | Status: DC | PRN

## 2020-01-04 MED ORDER — CEPHALEXIN 500 MG PO CAPS
500.0000 mg | ORAL_CAPSULE | Freq: Four times a day (QID) | ORAL | Status: DC
  Administered 2020-01-06 – 2020-01-09 (×10): 500 mg via ORAL
  Filled 2020-01-04 (×10): qty 1

## 2020-01-04 MED ORDER — PRENATAL MULTIVITAMIN CH
1.0000 | ORAL_TABLET | Freq: Every day | ORAL | Status: DC
  Administered 2020-01-05 – 2020-01-24 (×20): 1 via ORAL
  Filled 2020-01-04 (×20): qty 1

## 2020-01-04 MED ORDER — MAGNESIUM SULFATE BOLUS VIA INFUSION
4.0000 g | Freq: Once | INTRAVENOUS | Status: AC
  Administered 2020-01-04: 4 g via INTRAVENOUS
  Filled 2020-01-04: qty 1000

## 2020-01-04 MED ORDER — PANTOPRAZOLE SODIUM 40 MG PO TBEC
40.0000 mg | DELAYED_RELEASE_TABLET | Freq: Every day | ORAL | Status: DC
  Administered 2020-01-04 – 2020-01-24 (×21): 40 mg via ORAL
  Filled 2020-01-04 (×21): qty 1

## 2020-01-04 MED ORDER — SERTRALINE HCL 50 MG PO TABS
50.0000 mg | ORAL_TABLET | Freq: Every day | ORAL | Status: DC
  Administered 2020-01-04 – 2020-01-24 (×21): 50 mg via ORAL
  Filled 2020-01-04 (×21): qty 1

## 2020-01-04 MED ORDER — BETAMETHASONE SOD PHOS & ACET 6 (3-3) MG/ML IJ SUSP
12.0000 mg | INTRAMUSCULAR | Status: AC
  Administered 2020-01-04 – 2020-01-05 (×2): 12 mg via INTRAMUSCULAR
  Filled 2020-01-04: qty 5

## 2020-01-04 MED ORDER — PRENATAL MULTIVITAMIN CH: 1.0000 | ORAL_TABLET | Freq: Every day | ORAL | Status: DC

## 2020-01-04 MED ORDER — MAGNESIUM SULFATE 40 GM/1000ML IV SOLN
2.0000 g/h | INTRAVENOUS | Status: AC
  Administered 2020-01-04: 2 g/h via INTRAVENOUS
  Filled 2020-01-04: qty 1000

## 2020-01-04 MED FILL — LABETALOL HCL 200 MG TABLET: 200 | 30 days supply | Qty: 90 | Fill #0

## 2020-01-04 NOTE — H&P (Signed)
Antepartum History and Physical   Cheryl Ayala is a 34 y.o. female G1P0 that presented for grossly ruptured membranes with Mo-Di Twin pregnancy at [redacted]w[redacted]d. Patient reports LOF at ~6:40 PM.  She denies contractions but has light intermittent cramping.  She denies vaginal bleeding. She reports fetal movement. She denies fever, chills, SOB, HA.   This is an IVF pregnancy and is otherwise complicated by chronic hypertension for which she takes labetalol.  She was seen in office today and increased to a dose of 800 mg TID.  OB History    Gravida  1   Para      Term      Preterm      AB      Living  0     SAB      IAB      Ectopic      Multiple      Live Births             Past Medical History:  Diagnosis Date  . Anxiety   . GERD (gastroesophageal reflux disease)   . Hypertension   . Plantar fasciitis   . Venous insufficiency    Past Surgical History:  Procedure Laterality Date  . CHOLECYSTECTOMY, LAPAROSCOPIC  04/09/2019  . ESOPHAGEAL MANOMETRY N/A 09/25/2017   Procedure: ESOPHAGEAL MANOMETRY (EM);  Surgeon: Mauri Pole, MD;  Location: [facility];  Service: Endoscopy;  Laterality: N/A;  . IVF    . PLANTAR FASCIA RELEASE Bilateral    2019 and 2020  . WISDOM TOOTH EXTRACTION  2013   Family History: family history includes AAA (abdominal aortic aneurysm) in her father; Alzheimer's disease in her maternal grandmother; Anxiety disorder in her father and sister; Cancer in her mother; Depression in her father and sister; Gout in her maternal grandfather; Heart disease in her father, maternal grandmother, and paternal grandfather; Hyperlipidemia in her father; Hypertension in her father; Stroke in her maternal grandmother. Social History:  reports that she has never smoked. She has never used smokeless tobacco. She reports current alcohol use. She reports that she does not use drugs.     Maternal Diabetes: Glucola collected in office today 12/20  Genetic  Screening: insufficient cells on NIPS x 2 Maternal Ultrasounds/Referrals: fetal echo x 2 WNL Fetal Ultrasounds or other Referrals:  MFM Maternal Substance Abuse: No Significant Maternal Medications:  Labetalol, sertraline Significant Maternal Lab Results:  None Other Comments:  None  Review of Systems History Dilation: Closed Effacement (%): 50 Station: -3 Exam by:: Gaylan Gerold, CNM Blood pressure 137/78, pulse 92, resp. rate 20, last menstrual period 07/02/2019, SpO2 100 %. Exam Physical Exam  Prenatal labs: ABO, Rh:   Antibody:   Rubella:   RPR:    HBsAg:    HIV:    GBS:     Assessment/Plan: . Admit to [facility] for PPROM in the setting of cHTN and Mo-Di twins . Continuous EFM and toco . BMZ 12/20 - 12/21 . Mag gtt for neuroprotection . PPROM - latency antibiotics. Cervix visually closed per MAU. Will monitor status closely for labor, chorio . cHTN - medication dose increased in office today to 800 mg of labetalol TID.  Marland Kitchen Diet: NPO overnight, can consider diet if stable. Marland Kitchen DVT Ppx: SCDs . 1-hr gtt drawn in office today, will follow up results. . MFM [place] to assess which amnion ruptured, as well as growth, position  Carlyon Shadow 01/04/2020, 9:17 PM

## 2020-01-04 NOTE — MAU Provider Note (Signed)
Chief Complaint:  Rupture of Membranes   Event Date/Time   First Provider Initiated Contact with Patient 01/04/20 1948     HPI: Cheryl Ayala is a 34 y.o. G1P0 at [redacted]w[redacted]d (mono/di twins) who presents to maternity admissions reporting large LOF at 6:40pm this evening. She noticed some mucus after urinating about an hour prior and then felt her water release.  Past Medical History:  Diagnosis Date  . Anxiety   . GERD (gastroesophageal reflux disease)   . Hypertension   . Plantar fasciitis   . Venous insufficiency    OB History  Gravida Para Term Preterm AB Living  1         0  SAB IAB Ectopic Multiple Live Births               # Outcome Date GA Lbr Len/2nd Weight Sex Delivery Anes PTL Lv  1 Current            Past Surgical History:  Procedure Laterality Date  . CHOLECYSTECTOMY, LAPAROSCOPIC  04/09/2019  . ESOPHAGEAL MANOMETRY N/A 09/25/2017   Procedure: ESOPHAGEAL MANOMETRY (EM);  Surgeon: Mauri Pole, MD;  Location: [facility];  Service: Endoscopy;  Laterality: N/A;  . IVF    . PLANTAR FASCIA RELEASE Bilateral    2019 and 2020  . WISDOM TOOTH EXTRACTION  2013   Family History  Problem Relation Age of Onset  . Cancer Mother   . Hypertension Father   . Hyperlipidemia Father   . Anxiety disorder Father   . Depression Father   . Heart disease Father   . AAA (abdominal aortic aneurysm) Father   . Depression Sister   . Anxiety disorder Sister   . Heart disease Maternal Grandmother   . Alzheimer's disease Maternal Grandmother   . Stroke Maternal Grandmother   . Gout Maternal Grandfather   . Heart disease Paternal Grandfather    Social History   Tobacco Use  . Smoking status: Never Smoker  . Smokeless tobacco: Never Used  Vaping Use  . Vaping Use: Never used  Substance Use Topics  . Alcohol use: Yes    Comment: once every 6 months   . Drug use: Never   Allergies  Allergen Reactions  . Penicillins Rash    Has patient had a PCN reaction causing  immediate rash, facial/tongue/throat swelling, SOB or lightheadedness with hypotension: Yes Has patient had a PCN reaction causing severe rash involving mucus membranes or skin necrosis: No Has patient had a PCN reaction that required hospitalization: No Has patient had a PCN reaction occurring within the last 10 years: No If all of the above answers are "NO", then may proceed with Cephalosporin use.   . Sulfa Antibiotics Rash   Medications Prior to Admission  Medication Sig Dispense Refill Last Dose  . labetalol (NORMODYNE) 200 MG tablet Take 1 tablet (200 mg total) by mouth 2 (two) times daily. (Patient taking differently: Take 800 mg by mouth 3 (three) times daily.) 180 tablet 3 01/04/2020 at Unknown time  . Prenatal Vit-Fe Fumarate-FA (PRENATAL MULTIVITAMIN) TABS tablet Take 1 tablet by mouth daily at 12 noon.   01/03/2020 at Unknown time  . sertraline (ZOLOFT) 50 MG tablet Take 50 mg by mouth daily.   01/03/2020 at Unknown time  . dextroamphetamine (DEXEDRINE SPANSULE) 15 MG 24 hr capsule Take 1 capsule (15 mg total) by mouth daily. 30 capsule 0   . ibuprofen (ADVIL) 200 MG tablet Take 600-800 mg by mouth every 8 (eight) hours  as needed (for pain.). (Patient not taking: Reported on 11/16/2019)     . metFORMIN (GLUCOPHAGE) 1000 MG tablet DAYS 1-3:TAKE 1/2 TAB IN THE AM, DAYS 4-6: 1/2 TAB IN AM AND 1/2 TAB IN PM, DAYS 7-9: 1 TAB IN AM AND 1/2 TAB IN PM, THEN TAKE 1 TAB IN AM A (Patient not taking: Reported on 11/16/2019)     . omeprazole (PRILOSEC) 40 MG capsule Take 1 capsule (40 mg total) by mouth 2 (two) times daily before a meal. (Patient taking differently: Take 40 mg by mouth See admin instructions. Take 1 capsule (40 mg) by mouth scheduled every morning & take 1 capsule (20 mg) by mouth in the evening if needed for acid reflux/indigestion.) 90 capsule 3     I have reviewed patient's Past Medical Hx, Surgical Hx, Family Hx, Social Hx, medications and allergies.   ROS:  Review of Systems   All other systems reviewed and are negative.   Physical Exam   Patient Vitals for the past 24 hrs:  BP Pulse  01/04/20 2022 137/78 92  01/04/20 1945 (!) 148/80 86   Constitutional: Well-developed, well-nourished female in no acute distress.  Cardiovascular: normal rate & rhythm, no murmur Respiratory: normal effort, lung sounds clear throughout GI: Abd soft, non-tender, gravid appropriate for gestational age. Pos BS x 4 MS: Extremities nontender, no edema, normal ROM Neurologic: Alert and oriented x 4.  GU: no CVA tenderness Pelvic: NEFG, grossly ruptured, no blood noted  Dilation: Closed Effacement (%): 50 Cervical Position: Posterior Station: -3 Presentation: Vertex Exam by:: Gaylan Gerold, CNM  Fetal Tracing A: reactive  Baseline: 145 Variability: moderate Accelerations: present Decelerations: none Toco: irregular  Fetal Tracing B: reactive Baseline: 155 Variability: moderate Accelerations: present Decelerations: none Toco: irregular  MDM & MAU Course: Ancef and zithromax, magnesium bolus, BMZ ordered US MFM OB Limited ordered Sterile spec exam performed to confirm ROM  Assessment: 1. Preterm premature rupture of membranes (PPROM) with unknown onset of labor     Plan: Admit to [facility], care turned over to Dr. Mardelle Matte at [address], CNM, MSN, [facility] 01/04/20 8:56 PM

## 2020-01-04 NOTE — MAU Note (Signed)
Pt stated her water broke about 6:40 this evening. Has been having back pain yesterday. At [facility] office  Today no problem except HBP on b/P meds that they increased today. Good fetal movement felt

## 2020-01-05 DIAGNOSIS — O42919 Preterm premature rupture of membranes, unspecified as to length of time between rupture and onset of labor, unspecified trimester: Secondary | ICD-10-CM

## 2020-01-05 DIAGNOSIS — Z8759 Personal history of other complications of pregnancy, childbirth and the puerperium: Secondary | ICD-10-CM

## 2020-01-05 HISTORY — DX: Preterm premature rupture of membranes, unspecified as to length of time between rupture and onset of labor, unspecified trimester: O42.919

## 2020-01-05 LAB — GC/CHLAMYDIA PROBE AMP (~~LOC~~) NOT AT ARMC
Chlamydia: NEGATIVE
Comment: NEGATIVE
Comment: NORMAL
Neisseria Gonorrhea: NEGATIVE

## 2020-01-05 NOTE — Progress Notes (Signed)
S: Doing well. Feeling anxious but understands need for hospitalization.  Denies contractions or bleeding. Has continued to leak fluid. +FM x 2. No s/s PIH.   O:  Vitals:   01/05/20 0600 01/05/20 0802  BP: (!) 142/80 124/78  Pulse: 83 80  Resp: 20 20  Temp: 97.7 F (36.5 C) 97.9 F (36.6 C)  SpO2: 98% 98%     A/P: 34yo G1P0 @ 26.5 wga with mono-di twins admitted at 71.4 wga with PPROM of twin A  # PPROM - counseled regarding risks of expectant management including abruption, infection, and IUFD. Discussed indications for delivery including infection, labor, abruption or non-reassuring fetal status - Delivery at 38 wga - BMZ for FLM - first dose 12/20, second dose due tonight - Latency abx - No s/s of abruption, infection, or labor. Cervix closed an admission.   # cHTN:  - Labetalol 800mg  TID - better control - no s/s SIPE - restart baby asa once mag off  # MWB: - advance diet to regular - Hep lock once Mag off  # FWB - monodi twins - NICU consultation ordered - NST q shift - Stop Mag at 12 hour mark for CP proph given stability, restart prn labor/delivery - MFM [place] 12/20:   --A EFW 43%ile, oligo  --B EFW 69%ile, normal AFI  --8% discordancy - [place] q 2 weeks for TTTS surveillance  # GBS: unknown  # ROD: for primary CS with BTL. Is 436% certain of her choice even in lieu of risk of prematurity. Risks of ectopic, regret, and failure understood.   # Proph: SCDs  # Dispo  Lucillie Garfinkel MD

## 2020-01-06 LAB — CULTURE, BETA STREP (GROUP B ONLY)

## 2020-01-06 MED ORDER — SIMETHICONE 80 MG PO CHEW
80.0000 mg | CHEWABLE_TABLET | Freq: Once | ORAL | Status: AC
  Administered 2020-01-08: 80 mg via ORAL
  Filled 2020-01-06 (×2): qty 1

## 2020-01-06 MED ORDER — HEPARIN SODIUM (PORCINE) 20000 UNIT/ML IJ SOLN
12500.0000 [IU] | Freq: Two times a day (BID) | INTRAMUSCULAR | Status: DC
  Filled 2020-01-06 (×4): qty 0.63

## 2020-01-06 MED ORDER — HEPARIN SODIUM (PORCINE) 10000 UNIT/ML IJ SOLN
12500.0000 [IU] | Freq: Two times a day (BID) | INTRAMUSCULAR | Status: DC
  Administered 2020-01-06 – 2020-01-08 (×4): 12500 [IU] via SUBCUTANEOUS
  Filled 2020-01-06: qty 1.25
  Filled 2020-01-06: qty 2
  Filled 2020-01-06 (×3): qty 1.25
  Filled 2020-01-06: qty 2

## 2020-01-06 MED ORDER — DOCUSATE SODIUM 100 MG PO CAPS
100.0000 mg | ORAL_CAPSULE | Freq: Every day | ORAL | Status: DC
  Administered 2020-01-21: 100 mg via ORAL
  Filled 2020-01-06 (×16): qty 1

## 2020-01-06 MED ORDER — ASPIRIN 81 MG PO CHEW
81.0000 mg | CHEWABLE_TABLET | Freq: Every day | ORAL | Status: DC
  Administered 2020-01-06 – 2020-01-24 (×19): 81 mg via ORAL
  Filled 2020-01-06 (×19): qty 1

## 2020-01-06 MED ORDER — FERROUS SULFATE 325 (65 FE) MG PO TABS
325.0000 mg | ORAL_TABLET | Freq: Two times a day (BID) | ORAL | Status: DC
  Administered 2020-01-06 – 2020-01-07 (×4): 325 mg via ORAL
  Filled 2020-01-06 (×5): qty 1

## 2020-01-06 MED ORDER — HEPARIN SODIUM (PORCINE) 10000 UNIT/ML IJ SOLN: 10000.0000 [IU] | Freq: Two times a day (BID) | INTRAMUSCULAR | Status: DC

## 2020-01-06 MED ORDER — ENOXAPARIN SODIUM 40 MG/0.4ML ~~LOC~~ SOLN
40.0000 mg | Freq: Two times a day (BID) | SUBCUTANEOUS | Status: DC
  Administered 2020-01-06: 40 mg via SUBCUTANEOUS
  Filled 2020-01-06: qty 0.4

## 2020-01-06 NOTE — Progress Notes (Addendum)
No current c/o.  Active FM x 2.  Scant LOF continues.  No abdominal pain or CTX.  No fever or chills.  Denies HA, CP/SOB, RUQ pain, or visual disturbance.    Vitals:   01/06/20 0408 01/06/20 0812  BP: 130/70 (!) 147/86  Pulse: 87 91  Resp: 19 18  Temp: 98.1 F (36.7 C) 98.6 F (37 C)  SpO2: 96% 98%   NST Cat I x 2  CBC    Component Value Date/Time   WBC 16.1 (H) 01/04/2020 2008   RBC 4.25 01/04/2020 2008   HGB 9.8 (L) 01/04/2020 2008   HCT 32.0 (L) 01/04/2020 2008   PLT 410 (H) 01/04/2020 2008   MCV 75.3 (L) 01/04/2020 2008   MCH 23.1 (L) 01/04/2020 2008   MCHC 30.6 01/04/2020 2008   RDW 14.9 01/04/2020 2008   Gen: A&O x 3 Abd: soft, NT Ext: no c/c/e  34yo G1 at [redacted]w[redacted]d with mo/di twins, PPROM (Twin A), CHTN -PPROM: D3/7 latency abx; stable.  S/P magnesium sulfate x 12 hours for NP.  BMZ mature tonight at 2100.  NICU consult placed and pending.  Patient is extensively counseled re: complication of PPROM including infection, PTL, and abruption.  Patient to closely monitor for s/sx of these.  Plan delivery at 34 weeks or earlier prn. -Mo/Di twins-Reassuring monitoring.  NST q shift.  Patient plans primary C/S with BTL regardless of presentation and GA at delivery. U/S q 2 weeks (last 12/20). -CHTN-Continue labetalol 800 TID.  Labs wnl and asymptomatic.  Low dose ASA restarted -DVT ppx-SCDs and will start 12,500 BID unfractionated heparin given bedrest and likely prolonged period of immobility with obesity.  -Elevated 1h GCT in office.  3h GTT planned 2 weeks after BMZ administration -Chronic anemia-FeSO4 and colace started  Linda Hedges, DO

## 2020-01-07 LAB — CBC
HCT: 28 % — ABNORMAL LOW (ref 36.0–46.0)
Hemoglobin: 8.4 g/dL — ABNORMAL LOW (ref 12.0–15.0)
MCH: 22.8 pg — ABNORMAL LOW (ref 26.0–34.0)
MCHC: 30 g/dL (ref 30.0–36.0)
MCV: 76.1 fL — ABNORMAL LOW (ref 80.0–100.0)
Platelets: 386 10*3/uL (ref 150–400)
RBC: 3.68 MIL/uL — ABNORMAL LOW (ref 3.87–5.11)
RDW: 15.3 % (ref 11.5–15.5)
WBC: 16.4 10*3/uL — ABNORMAL HIGH (ref 4.0–10.5)
nRBC: 0.6 % — ABNORMAL HIGH (ref 0.0–0.2)

## 2020-01-07 LAB — TYPE AND SCREEN
ABO/RH(D): O POS
Antibody Screen: NEGATIVE

## 2020-01-07 MED ORDER — HEPARIN SODIUM (PORCINE) 20000 UNIT/ML IJ SOLN: 12500.0000 [IU] | Freq: Two times a day (BID) | INTRAMUSCULAR | Status: DC

## 2020-01-07 MED ORDER — HEPARIN SODIUM (PORCINE) 5000 UNIT/ML IJ SOLN: 5000.0000 [IU] | Freq: Two times a day (BID) | INTRAMUSCULAR | Status: DC

## 2020-01-07 NOTE — Progress Notes (Signed)
27 0/7  Small trickles of fluid, no pain, no bleeding  Today's Vitals   01/06/20 2141 01/06/20 2149 01/06/20 2303 01/07/20 0421  BP:  (!) 155/90 (!) 148/90 132/72  Pulse:  98 82 77  Resp:   19   Temp:  98.3 F (36.8 C) 98.1 F (36.7 C) 98 F (36.7 C)  TempSrc:  Oral Oral Oral  SpO2:  98% 98% 98%  Weight:      Height:      PainSc: 0-No pain      Body mass index is 48.82 kg/m.   Uterus NT  NST Cat one x 2  A/P: 34 yo G1P0 27 0/7 wks         Twins mo/di          PPROM A-latency atb D4                           - magnesium sulfate for 12 hours, neuroprotection, done                           -BMZ 12/21-12/22          FWB- NST Cat one x 2                   -U/S 12/20, repeat 2 weeks          Primary C/S with BTL for delivery regardless of gestational age or presentation.           CHTN- labetalol 800mg  TID                    - ASA 81mg  qd          DVT ppx-SCD and unfractionated heparin 12,500 BID          Elevated 1 hr GCT > needs 3 hour GTT 2 weeks after BMZ          Chronic Anemia-FeSO4 and colace          NICU consult pending

## 2020-01-08 MED ORDER — SODIUM CHLORIDE 0.9 % IV SOLN
500.0000 mg | Freq: Once | INTRAVENOUS | Status: AC
  Administered 2020-01-08: 500 mg via INTRAVENOUS
  Filled 2020-01-08: qty 25

## 2020-01-08 MED ORDER — SIMETHICONE 80 MG PO CHEW
80.0000 mg | CHEWABLE_TABLET | Freq: Four times a day (QID) | ORAL | Status: DC | PRN
  Administered 2020-01-08 – 2020-01-09 (×2): 80 mg via ORAL
  Filled 2020-01-08 (×2): qty 1

## 2020-01-08 MED ORDER — NIFEDIPINE ER OSMOTIC RELEASE 30 MG PO TB24
30.0000 mg | ORAL_TABLET | Freq: Every day | ORAL | Status: DC
  Administered 2020-01-08 – 2020-01-24 (×17): 30 mg via ORAL
  Filled 2020-01-08 (×17): qty 1

## 2020-01-08 MED ORDER — SODIUM CHLORIDE 0.9 % IV SOLN
INTRAVENOUS | Status: DC | PRN
  Administered 2020-01-08 – 2020-01-11 (×3): 250 mL via INTRAVENOUS

## 2020-01-08 MED ORDER — HEPARIN SODIUM (PORCINE) 10000 UNIT/ML IJ SOLN
7500.0000 [IU] | Freq: Two times a day (BID) | INTRAMUSCULAR | Status: DC
  Administered 2020-01-08 – 2020-01-21 (×27): 7500 [IU] via SUBCUTANEOUS
  Filled 2020-01-08 (×29): qty 1

## 2020-01-08 NOTE — Progress Notes (Signed)
Patient ID: Cheryl Ayala, female   DOB: 1985/03/06, 34 y.o.   MRN: [id number] Cheryl Ayala is doing well Babies are active Leaking mostly when getting up, clear Mild, rare ctxs  VSSAF BPs elevated but not severe  98.5 F (36.9 C) 69 -- 20 145/81Abnormal   FHRs 140s no decels Ctxs 3/Hr  Abd Gravid, nt Legs:  Neg homans bilaterally   Mo/Di Twins at 45 1/7 PPROM A - cont in hospital with increased rest.  No sxs of chorio S/P Magnesium for neuroprotection S/P BMX x 2 Currently on D5/7 latency Abx C/S for delivery with BTL  (Vtx/transverse) CHTN - stable on labetalol 800mg  TID, baby asa DVT PPx - encouraged regular use of SCDs.  Discussed with Pharmacy and will change Unfractionated Heparin to 7500U BID c/w ACOG 2nd trimester recommendations. Anemia - Pharmacist rec decreasing oral Fe to q other day.  Discussed with patient,and she has a hard time with GI side effects.  Last Hgb 8.4.  Will give IV Venofer 500mg  x 1.  DL

## 2020-01-09 LAB — COMPREHENSIVE METABOLIC PANEL
ALT: 15 U/L (ref 0–44)
AST: 20 U/L (ref 15–41)
Albumin: 2 g/dL — ABNORMAL LOW (ref 3.5–5.0)
Alkaline Phosphatase: 60 U/L (ref 38–126)
Anion gap: 10 (ref 5–15)
BUN: 6 mg/dL (ref 6–20)
CO2: 24 mmol/L (ref 22–32)
Calcium: 8.4 mg/dL — ABNORMAL LOW (ref 8.9–10.3)
Chloride: 103 mmol/L (ref 98–111)
Creatinine, Ser: 0.61 mg/dL (ref 0.44–1.00)
GFR, Estimated: >60 mL/min (ref >60)
Glucose, Bld: 95 mg/dL (ref 70–99)
Potassium: 4 mmol/L (ref 3.5–5.1)
Sodium: 137 mmol/L (ref 135–145)
Total Bilirubin: 0.5 mg/dL (ref 0.3–1.2)
Total Protein: 5.5 g/dL — ABNORMAL LOW (ref 6.5–8.1)

## 2020-01-09 LAB — URINALYSIS, ROUTINE W REFLEX MICROSCOPIC
Bacteria, UA: NONE SEEN
Bilirubin Urine: NEGATIVE
Glucose, UA: NEGATIVE mg/dL
Ketones, ur: NEGATIVE mg/dL
Nitrite: NEGATIVE
Protein, ur: 30 mg/dL — AB
Specific Gravity, Urine: 1.014 (ref 1.005–1.030)
pH: 7 (ref 5.0–8.0)

## 2020-01-09 LAB — CBC
HCT: 28.6 % — ABNORMAL LOW (ref 36.0–46.0)
Hemoglobin: 9.2 g/dL — ABNORMAL LOW (ref 12.0–15.0)
MCH: 23.5 pg — ABNORMAL LOW (ref 26.0–34.0)
MCHC: 32.2 g/dL (ref 30.0–36.0)
MCV: 73.1 fL — ABNORMAL LOW (ref 80.0–100.0)
Platelets: 373 10*3/uL (ref 150–400)
RBC: 3.91 MIL/uL (ref 3.87–5.11)
RDW: 15.2 % (ref 11.5–15.5)
WBC: 16.9 10*3/uL — ABNORMAL HIGH (ref 4.0–10.5)
nRBC: 0.6 % — ABNORMAL HIGH (ref 0.0–0.2)

## 2020-01-09 MED ORDER — ACETAMINOPHEN 500 MG PO TABS
1000.0000 mg | ORAL_TABLET | Freq: Four times a day (QID) | ORAL | Status: DC | PRN
  Administered 2020-01-09 – 2020-01-24 (×4): 1000 mg via ORAL
  Filled 2020-01-09 (×4): qty 2

## 2020-01-09 MED ORDER — SODIUM CHLORIDE 0.9 % IV SOLN
1.0000 g | INTRAVENOUS | Status: DC
  Administered 2020-01-09 – 2020-01-11 (×3): 1 g via INTRAVENOUS
  Filled 2020-01-09: qty 1
  Filled 2020-01-09: qty 10
  Filled 2020-01-09: qty 1
  Filled 2020-01-09: qty 10

## 2020-01-09 NOTE — Progress Notes (Signed)
Patient ID: [name], female   DOB: 1985-08-27, 34 y.o.   MRN: [id number] Cheryl Ayala is doing great. Some urgency sxs today Had a smear of blood on pad but none since Babies are active Leaking mostly when getting up, clear No ctxs  VSSAF BPs elevated but not severe  98.5 F (36.9 C) 69 -- 20 145/81Abnormal   FHRs 140s no decels Ctxs none  Abd Gravid, nt Legs:  Neg homans bilaterally   Mo/Di Twins at 92 2/7 PPROM A - cont in hospital with increased rest.  No sxs of chorio S/P Magnesium for neuroprotection S/P BMX x 2 Currently on D6/7 latency Abx C/S for delivery with BTL  (Vtx/transverse) CHTN - Added Procardia 30mg  XL yesterday and on labetalol 800mg  TID,Now with good control.  Normal labs today (cmet) baby asa DVT PPx - encouraged regular use of SCDs.  Discussed with Pharmacy and will change Unfractionated Heparin to 7500U BID c/w ACOG 2nd trimester recommendations. Anemia - Pharmacist rec decreasing oral Fe to q other day.  Discussed with patient,and she has a hard time with GI side effects.  Hgb improved to 9.4.  Received IV Venofer 500mg  x 1.  DL

## 2020-01-10 LAB — CULTURE, OB URINE

## 2020-01-10 NOTE — Progress Notes (Signed)
Patient ID: Cheryl Ayala, female   DOB: 03/10/1985, 34 y.o.   MRN: [id number] Saki is doing great.  Her urinary sxs and cramping went away after Rocepin given Babies are active Leaking mostly when getting up, clear No ctxs  VSSAF BPs 123/78 this am   FHRs 140s no decels Ctxs none  Abd Gravid, nt Legs: Neg homans bilaterally   Mo/Di Twins at 56 3/7 PPROM A- cont in hospital with increased rest. No sxs of chorio S/P Magnesium for neuroprotection S/P BMX x 2 Currently on D6/7 latency Abx ( now IV Rocephin until Urine culture returns) C/S for delivery with BTL(Vtx/transverse) CHTN - Added Procardia 30mg  XL yesterday and on labetalol 800mg  TID,Now with good control.  Normal labs today (cmet) baby asa DVT PPx- encouraged regular use of SCDs. Discussed with Pharmacy and will change Unfractionated Heparin to 7500U BID c/w ACOG 2nd trimester recommendations. Anemia- Pharmacist rec decreasing oral Fe to q other day. Discussed with patient,and she has a hard time with GI side effects. Hgb improved to 9.4. Received IV Venofer 500mg  x 1. UTI - Changed to IV rocephin until culture returns .  Got UTI while on keflex.  Sxs have improved since the change DL

## 2020-01-11 ENCOUNTER — Inpatient Hospital Stay (HOSPITAL_BASED_OUTPATIENT_CLINIC_OR_DEPARTMENT_OTHER): Payer: No Typology Code available for payment source

## 2020-01-11 DIAGNOSIS — O30032 Twin pregnancy, monochorionic/diamniotic, second trimester: Secondary | ICD-10-CM | POA: Diagnosis not present

## 2020-01-11 DIAGNOSIS — O4292 Full-term premature rupture of membranes, unspecified as to length of time between rupture and onset of labor: Secondary | ICD-10-CM | POA: Diagnosis not present

## 2020-01-11 DIAGNOSIS — Z3A27 27 weeks gestation of pregnancy: Secondary | ICD-10-CM

## 2020-01-11 DIAGNOSIS — O10012 Pre-existing essential hypertension complicating pregnancy, second trimester: Secondary | ICD-10-CM | POA: Diagnosis not present

## 2020-01-11 DIAGNOSIS — O09522 Supervision of elderly multigravida, second trimester: Secondary | ICD-10-CM | POA: Diagnosis not present

## 2020-01-11 DIAGNOSIS — O09812 Supervision of pregnancy resulting from assisted reproductive technology, second trimester: Secondary | ICD-10-CM

## 2020-01-11 DIAGNOSIS — Z148 Genetic carrier of other disease: Secondary | ICD-10-CM

## 2020-01-11 LAB — CBC WITH DIFFERENTIAL/PLATELET
Abs Immature Granulocytes: 0.88 10*3/uL — ABNORMAL HIGH (ref 0.00–0.07)
Basophils Absolute: 0.1 10*3/uL (ref 0.0–0.1)
Basophils Relative: 1 %
Eosinophils Absolute: 0.3 10*3/uL (ref 0.0–0.5)
Eosinophils Relative: 1 %
HCT: 29.2 % — ABNORMAL LOW (ref 36.0–46.0)
Hemoglobin: 9.4 g/dL — ABNORMAL LOW (ref 12.0–15.0)
Immature Granulocytes: 5 %
Lymphocytes Relative: 18 %
Lymphs Abs: 3.5 10*3/uL (ref 0.7–4.0)
MCH: 24.3 pg — ABNORMAL LOW (ref 26.0–34.0)
MCHC: 32.2 g/dL (ref 30.0–36.0)
MCV: 75.5 fL — ABNORMAL LOW (ref 80.0–100.0)
Monocytes Absolute: 1.2 10*3/uL — ABNORMAL HIGH (ref 0.1–1.0)
Monocytes Relative: 6 %
Neutro Abs: 13.2 10*3/uL — ABNORMAL HIGH (ref 1.7–7.7)
Neutrophils Relative %: 69 %
Platelets: 403 10*3/uL — ABNORMAL HIGH (ref 150–400)
RBC: 3.87 MIL/uL (ref 3.87–5.11)
RDW: 15.9 % — ABNORMAL HIGH (ref 11.5–15.5)
WBC: 19.2 10*3/uL — ABNORMAL HIGH (ref 4.0–10.5)
nRBC: 0.4 % — ABNORMAL HIGH (ref 0.0–0.2)

## 2020-01-11 LAB — TYPE AND SCREEN
ABO/RH(D): O POS
Antibody Screen: NEGATIVE

## 2020-01-11 MED ORDER — SODIUM CHLORIDE 0.9% FLUSH
3.0000 mL | Freq: Two times a day (BID) | INTRAVENOUS | Status: DC
  Administered 2020-01-11 – 2020-01-24 (×2): 3 mL via INTRAVENOUS

## 2020-01-11 NOTE — Progress Notes (Signed)
Antepartum Progress Note  Cheryl Ayala is a 34 y.o. G1P0 at [redacted]w[redacted]d, hospital day 8, admitted with Mo Di twins with PPROM of baby A.  This morning she reports no acute events, urinary symptoms have not returned. Denies fever, chills, SOB, CP.  Denies contractions, abdominal pain, cramping, vaginal bleeding.  Reports FM x 2.  Today's Vitals   01/10/20 2215 01/11/20 0155 01/11/20 0554 01/11/20 0748  BP: 140/80 125/75 (!) 148/74 (!) 146/80  Pulse: 79 78 75 82  Resp:  16 17 17   Temp:  97.9 F (36.6 C) 98.2 F (36.8 C) 97.8 F (36.6 C)  TempSrc:  Oral Oral Oral  SpO2: 98% 97% 99% 99%  Weight:      Height:      PainSc:       Body mass index is 48.82 kg/m.  Gen: alert, no distress Chest: nonlabored breaths CV: no peripheral edema Abdomen: gravid, soft, nontender Ext: No signs of DVT  FHT: reactive and reassuring x 2  A/P: Mo/Di Twins, [redacted]w[redacted]d - PPROM of twin A  - s/p neuroprotective mag  - s/p BMZ 12/20, 12/21  - Day 7 of latency Abx today.  IV rocephin added for dirty UA and urinary symptoms with good improvement.  - cHTN: labetalol 800 mg TID and procardia 30 mg XL QD. Stable.  - DVT PPx: 7500U heparin BID  - Anemia - s/p 1 x IV venofer. HGB 9.4  - Continue IV rocephin while UCx pending.  - Last growth was in office 12/6, OB 14/6 on admission was limited.  Will reorder to assess growth in setting of mo/di twins, cHTN, and PPROM

## 2020-01-11 NOTE — Consult Note (Signed)
Neonatology Consult Note:  At the request of the patients obstetrician Dr. Leonette Monarch I met with [name] who is a 34 y.o. G1P0 at [redacted]w[redacted]d admitted with Mo Di twins with PPROM of baby A.    She is s/p treatment with neuroprotective magnesium sulfate, s/p BMZ 12/20, 12/21.  She has received 7 days of latency antibiotics and is currently being treated with IV rocephin for a rule out UTI.  Hypertension is treated with labetalol and procardia and she is receiving heparin for DVT prophylaxis.    We discussed morbidity/mortality at this gestional age, delivery room resuscitation, including intubation and surfactant in DR.  Discussed mechanical ventilation and risk for chronic lung disease, risk for IVH with potential for motor / cognitive deficits, ROP, NEC, sepsis, as well as temperature instability and feeding immaturity.  Discussed NG / OG feeds, benefits of MBM in reducing incidence of NEC.   Discussed likely length of stay.  Thank you for allowing [facility] participate in her care.  Please call with questions.  BHiginio Roger DO  Neonatologist  The total length of face-to-face or floor / unit time for this encounter was 30 minutes.  Counseling and / or coordination of care was greater than fifty percent of the time.

## 2020-01-12 NOTE — Progress Notes (Signed)
Problem List: Twin IUP at 13 w 5 days  Mono/Di PPROM (A )  Chronic Hypertension Anemia  S:  Patient is resting well. No complaints. Good fetal movement. No contractions. No urinary symptoms.  O:  BP 117/67 (BP Location: Right Arm)   Pulse 87   Temp 97.6 F (36.4 C) (Oral)   Resp 19   Ht 5\' 11"  (1.803 m)   Wt (!) 158.8 kg   LMP 07/02/2019   SpO2 98%   BMI 48.82 kg/m  No results found for this or any previous visit (from the past 24 hour(s)).  Abdomen is soft and non tender  Ultrasound yesterday - 6% growth discrepancy  AFI sac A WNL  IMPRESSION:  1.  TWIN IUP (Mono/Di) at 27 w 5 days  PPROM (A) 12/20  no evidence of chorioamnionitis Status post magnesium for neuroprotection and steroid series 12/20-12/21 Completed latency antibiotics  C Section and BTL for delivery   2.  Chronic hypertension Labetalol 800 mg po tid Procardia 30 mg xl qd  3. Anemia - status post iv iron   4.  Urinary symptoms - resolved  Discontinue Rocephin  5.  DVT prophylaxis - 7500 units heparin BID

## 2020-01-12 NOTE — Progress Notes (Signed)
Initial Nutrition Assessment  DOCUMENTATION CODES:  Morbid obesity  INTERVENTION:  Regular diet Pt may order double protein portions and snacks TID if she makes request when ordering meals   NUTRITION DIAGNOSIS:   Increased nutrient needs related to  (twin pregnancy and fetal growth requirements) as evidenced by  (27 weeks IUP).  GOAL:   Patient will meet greater than or equal to 90% of their needs  MONITOR:   Weight trends  REASON FOR ASSESSMENT:   Antenatal,LOS   ASSESSMENT:   27 5/7 weeks adm due to PROM twin A. wt at 10 weeks, 148 kg, BMI 45.6. 23 lb weight gain to date   Diet Order:   Diet Order            Diet regular Room service appropriate? Yes; Fluid consistency: Thin  Diet effective now                EDUCATION NEEDS:   No education needs have been identified at this time  Skin:  Skin Assessment: Reviewed RN Assessment  Last BM:     Height:   Ht Readings from Last 1 Encounters:  01/04/20 5\' 11"  (1.803 m)    Weight:   Wt Readings from Last 1 Encounters:  01/04/20 (!) 158.8 kg    Ideal Body Weight:   155 lbs  BMI:  Body mass index is 48.82 kg/m.  Estimated Nutritional Needs:   Kcal:  3100-3300  Protein:  130-140 g  Fluid:  3.4 L    01/06/20 M.M LDN Neonatal Nutrition Support Specialist/RD III

## 2020-01-13 ENCOUNTER — Inpatient Hospital Stay (HOSPITAL_BASED_OUTPATIENT_CLINIC_OR_DEPARTMENT_OTHER): Payer: No Typology Code available for payment source

## 2020-01-13 DIAGNOSIS — O99212 Obesity complicating pregnancy, second trimester: Secondary | ICD-10-CM | POA: Diagnosis not present

## 2020-01-13 DIAGNOSIS — E669 Obesity, unspecified: Secondary | ICD-10-CM | POA: Diagnosis not present

## 2020-01-13 DIAGNOSIS — Z148 Genetic carrier of other disease: Secondary | ICD-10-CM

## 2020-01-13 DIAGNOSIS — O42912 Preterm premature rupture of membranes, unspecified as to length of time between rupture and onset of labor, second trimester: Secondary | ICD-10-CM | POA: Diagnosis not present

## 2020-01-13 DIAGNOSIS — O09522 Supervision of elderly multigravida, second trimester: Secondary | ICD-10-CM

## 2020-01-13 DIAGNOSIS — O10012 Pre-existing essential hypertension complicating pregnancy, second trimester: Secondary | ICD-10-CM

## 2020-01-13 DIAGNOSIS — O30032 Twin pregnancy, monochorionic/diamniotic, second trimester: Secondary | ICD-10-CM

## 2020-01-13 DIAGNOSIS — O09812 Supervision of pregnancy resulting from assisted reproductive technology, second trimester: Secondary | ICD-10-CM

## 2020-01-13 DIAGNOSIS — Z3A27 27 weeks gestation of pregnancy: Secondary | ICD-10-CM

## 2020-01-13 LAB — TYPE AND SCREEN
ABO/RH(D): O POS
Antibody Screen: NEGATIVE

## 2020-01-13 LAB — CBC
HCT: 33.2 % — ABNORMAL LOW (ref 36.0–46.0)
Hemoglobin: 10 g/dL — ABNORMAL LOW (ref 12.0–15.0)
MCH: 23.4 pg — ABNORMAL LOW (ref 26.0–34.0)
MCHC: 30.1 g/dL (ref 30.0–36.0)
MCV: 77.6 fL — ABNORMAL LOW (ref 80.0–100.0)
Platelets: 407 10*3/uL — ABNORMAL HIGH (ref 150–400)
RBC: 4.28 MIL/uL (ref 3.87–5.11)
RDW: 17.3 % — ABNORMAL HIGH (ref 11.5–15.5)
WBC: 19.4 10*3/uL — ABNORMAL HIGH (ref 4.0–10.5)
nRBC: 0.2 % (ref 0.0–0.2)

## 2020-01-13 LAB — APTT: aPTT: 27 seconds (ref 24–36)

## 2020-01-13 NOTE — Progress Notes (Signed)
No c/o.  Active FM x 2.  No abdominal tenderness/pain or subjective fever/chills.  No CTX or vaginal bleeding.  No HA, vision change, RUQ pain, CP/SOB.  Vitals:   01/12/20 2356 01/13/20 0418  BP: (!) 138/93 133/72  Pulse: 86 79  Resp: 18 20  Temp:  97.7 F (36.5 C)  SpO2: 98% 99%   Gen: A&O x 3 Abd: soft, non-tender Ext: no c/c/e.    Fetal monitoring: unable to clearly distinguish Twin A from B on NST last night; U/S done with BPP 8/8 x 2  34yo G1 at [redacted]w[redacted]d with mo/di twins, PPROM (Twin A), CHTN -PPROM: s/p latency abx.  S/P magnesium sulfate x 12 hours for NP.  BMZ mature (12/20 & 21).  S/P NICU consult. Patient is extensively counseled re: complication of PPROM including infection, PTL, and abruption.  Patient to closely monitor for s/sx of these.  Plan delivery at 34 weeks or earlier prn. -Mo/Di twins-Reassuring monitoring.  NST q day.  Patient plans primary C/S with BTL regardless of presentation and GA at delivery. BPP weekly. -CHTN-Continue labetalol 800 TID and Procardia 30XL QD.  -DVT ppx-SCDs prophylactic dose heparin.  -Elevated 1h GCT in office.  3h GTT planned in 1 week (2 weeks after BMZ administration) -Chronic anemia-s/p iron infusion  Mitchel Honour, DO

## 2020-01-13 NOTE — Progress Notes (Signed)
Patient complains of dizziness. Dr. Langston Masker notified of patient vital signs and complaints. Orders received to hold scheduled dose of labetalol until next scheduled dose.

## 2020-01-14 MED ORDER — LABETALOL HCL 200 MG PO TABS
600.0000 mg | ORAL_TABLET | Freq: Once | ORAL | Status: AC
  Administered 2020-01-14: 600 mg via ORAL
  Filled 2020-01-14: qty 3

## 2020-01-14 MED ORDER — LABETALOL HCL 200 MG PO TABS
800.0000 mg | ORAL_TABLET | Freq: Two times a day (BID) | ORAL | Status: DC
  Administered 2020-01-15 – 2020-01-24 (×20): 800 mg via ORAL
  Filled 2020-01-14 (×20): qty 4

## 2020-01-14 NOTE — Progress Notes (Signed)
Pt with c/o dizziness/low BP at around same time in afternoon each day.  Will decrease Labetalol from 800 mg TID to BID. Hold Labetalol dose this evening and give next Labetalol dose tomorrow AM.    Rosie Fate MD

## 2020-01-14 NOTE — Progress Notes (Signed)
No c/o.  Doing well. FM x 2. Denies PIH s/s, vaginal bleeding or ctxn.   Vitals:   01/14/20 0454 01/14/20 0806  BP:  113/62  Pulse: 78 98  Resp:  18  Temp:  97.6 F (36.4 C)  SpO2:  98%   Gen: A&O x 3 Abd: soft, non-tender Ext: No edema b/l  NST overnight reactive  34yo G1 at 28.0 wga with mo/di twins, PPROM (Twin A), CHTN - PPROM:   -- s/p latency abx.    -- S/P magnesium sulfate x 12 hours for NP.    -- BMZ complete (12/20 & 21).    -- S/P NICU consult.   -- s/p extensive counseling.  -- Plan delivery at 34 weeks or earlier prn. - Mo/Di twins-Reassuring monitoring  -- NST q day  -- Weekly BPP (q Wed)  -- Patient plans primary C/S with BTL regardless of presentation and GA at delivery.  - CHTN-Continue labetalol 800 TID and Procardia 30XL QD.  - DVT ppx-SCDs prophylactic dose heparin.  - Elevated 1h GCT in office.  3h GTT planned on January 3rd (2 weeks after BMZ administration) - Chronic anemia-s/p iron infusion  Belva Agee MD

## 2020-01-14 NOTE — Progress Notes (Signed)
Pt complaining of dizziness following afternoon labetalol dose. BP 90/50. Dr. Elon Spanner notified. RN to discontinue labetalol.

## 2020-01-15 NOTE — Progress Notes (Signed)
Antepartum Progress Note  Cheryl Ayala is a 34 y.o. G1P0 at [redacted]w[redacted]d with mo-di twins admitted for PPROM of baby A.  Patient reports no issues overnight.  Yesterday she experienced lightheadedness during her afternoon labetalol dose. This has been adjusted.  She denies fever, chills, SOB, CP, abdominal pain or cramping. Leaking clear fluid.  Denies vaginal bleeding.  Reports good fetal movement x 2.    Vitals:   01/14/20 2247 01/15/20 0428  BP: (!) 144/97 (!) 145/71  Pulse: 92 86  Resp: 18 17  Temp: 98.2 F (36.8 C) 97.8 F (36.6 C)  SpO2: 98% 97%   Gen: A&O x 3 Abd: soft, non-tender Ext: No edema b/l  NST overnight reactive  34yo G1 at 28.1 wga with mo/di twins, PPROM (Twin A), CHTN - PPROM:   -- s/p latency abx.    -- S/P magnesium sulfate x 12 hours for NP.    -- BMZ complete (12/20 & 21).    -- S/P NICU consult.   -- s/p extensive counseling.  -- Plan delivery at 34 weeks or earlier prn. - Mo/Di twins-Reassuring monitoring  -- NST q day  -- Weekly BPP (q Wed)  -- Patient plans primary C/S with BTL regardless of presentation and GA at delivery.  - CHTN- labetalol switched from 800 mg TID to BID for mid-day hypotension.  Continue Procardia 30XL QD.  - DVT ppx-SCDs prophylactic dose heparin.  - Elevated 1h GCT in office.  3h GTT planned on January 3rd (2 weeks after BMZ administration) - Chronic anemia-s/p iron infusion  Nilda Simmer MD

## 2020-01-16 LAB — TYPE AND SCREEN
ABO/RH(D): O POS
Antibody Screen: NEGATIVE

## 2020-01-16 LAB — CBC
HCT: 32.1 % — ABNORMAL LOW (ref 36.0–46.0)
Hemoglobin: 9.8 g/dL — ABNORMAL LOW (ref 12.0–15.0)
MCH: 23.7 pg — ABNORMAL LOW (ref 26.0–34.0)
MCHC: 30.5 g/dL (ref 30.0–36.0)
MCV: 77.7 fL — ABNORMAL LOW (ref 80.0–100.0)
Platelets: 340 10*3/uL (ref 150–400)
RBC: 4.13 MIL/uL (ref 3.87–5.11)
RDW: 17.5 % — ABNORMAL HIGH (ref 11.5–15.5)
WBC: 17.8 10*3/uL — ABNORMAL HIGH (ref 4.0–10.5)
nRBC: 0.2 % (ref 0.0–0.2)

## 2020-01-16 NOTE — Progress Notes (Signed)
Antepartum Progress Note  Cheryl Ayala is a 35 y.o. G1P0 at [redacted]w[redacted]d with mo-di twins admitted for PPROM of baby A.  Patient reports no issues overnight.  She denies fever, chills, SOB, CP, abdominal pain or cramping. Leaking clear fluid.  Denies vaginal bleeding.  Reports good fetal movement x 2.    Vitals:   01/16/20 0505 01/16/20 0749  BP: (!) 125/92 123/81  Pulse: 85 89  Resp: 18 18  Temp: 97.6 F (36.4 C) 97.9 F (36.6 C)  SpO2: 97% 97%   Gen: A&O x 3 Abd: soft, non-tender Ext: No edema b/l  NST overnight reactive  34yo G1 at 28.2 wga with mo/di twins, PPROM (Twin A), CHTN - PPROM:   -- s/p latency abx.    -- S/P magnesium sulfate x 12 hours for NP.    -- BMZ complete (12/20 & 21).    -- S/P NICU consult.   -- s/p extensive counseling.  -- Plan delivery at 34 weeks or earlier prn. - Mo/Di twins-Reassuring monitoring  -- NST q day  -- Weekly BPP (q Wed)  -- Patient plans primary C/S with BTL regardless of presentation and GA at delivery.  - CHTN- Normal and mild range.  Continue labetalol 800 mg BID and Procardia 30XL QD.  - DVT ppx-SCDs prophylactic dose heparin.  - Elevated 1h GCT in office.  3h GTT planned on January 3rd (2 weeks after BMZ administration) - Chronic anemia-s/p iron infusion  Nilda Simmer MD

## 2020-01-17 MED ORDER — CEFDINIR 300 MG PO CAPS
300.0000 mg | ORAL_CAPSULE | Freq: Two times a day (BID) | ORAL | Status: AC
  Administered 2020-01-17 – 2020-01-22 (×10): 300 mg via ORAL
  Filled 2020-01-17 (×11): qty 1

## 2020-01-17 NOTE — Progress Notes (Signed)
Antepartum Progress Note  Cheryl Ayala is a 35 y.o. G1P0 at [redacted]w[redacted]d with mo-di twins admitted for PPROM of baby A.  Patient reports no issues overnight.  She denies fever, chills, SOB, CP, abdominal pain or cramping. Leaking clear fluid.  Denies vaginal bleeding.  Reports good fetal movement x 2.    Vitals:   01/16/20 2318 01/17/20 0507  BP: 116/70 (!) 143/86  Pulse: 85 81  Resp: 18 18  Temp: 97.9 F (36.6 C) 97.6 F (36.4 C)  SpO2: 98% 99%   Gen: A&O x 3 Abd: soft, non-tender Ext: No edema b/l  NST overnight reactive  34yo G1 at 28.3 wga with mo/di twins, PPROM (Twin A), CHTN - PPROM:   -- s/p latency abx.    -- S/P magnesium sulfate x 12 hours for NP.    -- BMZ complete (12/20 & 21).    -- S/P NICU consult.   -- s/p extensive counseling.  -- Plan delivery at 34 weeks or earlier prn. - Mo/Di twins-Reassuring monitoring  -- NST q day  -- Weekly BPP (q Wed)  -- Patient plans primary C/S with BTL regardless of presentation and GA at delivery.  - CHTN- Normal and mild range.  Continue labetalol 800 mg BID and Procardia 30XL QD.  - DVT ppx-SCDs prophylactic dose heparin.  - Elevated 1h GCT in office.  3h GTT planned on January 3rd (2 weeks after BMZ administration) - Chronic anemia-s/p iron infusion  Nilda Simmer MD

## 2020-01-18 LAB — GLUCOSE, 3 HOUR GESTATIONAL: Glucose, GTT - 3 Hour: 154 mg/dL — ABNORMAL HIGH (ref 70–144)

## 2020-01-18 LAB — GLUCOSE, FASTING GESTATIONAL: Glucose Tolerance, Fasting: 127 mg/dL

## 2020-01-18 LAB — CULTURE, OB URINE: Culture: NO GROWTH

## 2020-01-18 LAB — GLUCOSE, 2 HOUR GESTATIONAL: Glucose Tolerance, 2 hour: 203 mg/dL — ABNORMAL HIGH (ref 70–164)

## 2020-01-18 LAB — GLUCOSE, 1 HOUR GESTATIONAL: Glucose Tolerance, 1 hour: 242 mg/dL — ABNORMAL HIGH (ref 70–189)

## 2020-01-18 NOTE — Progress Notes (Signed)
Was having urinary urgency and scant blood with wiping yesterday. Started Omnicef last pm and she states urgency and spotting improved. Scant leaking, no pain, no SOB  Today's Vitals   01/17/20 2313 01/18/20 0257 01/18/20 0755 01/18/20 0800  BP: (!) 145/92 118/78 (!) 141/100   Pulse: 92 91 (!) 104   Resp: 18 17    Temp: 97.8 F (36.6 C) 97.6 F (36.4 C) 97.9 F (36.6 C)   TempSrc: Oral Oral Oral   SpO2: 98% 96% 99%   Weight:      Height:      PainSc:    0-No pain   Body mass index is 48.82 kg/m.   Uterus NT, soft  NST cat one x 2  A/P: Mo/Di Twins 28 4/7 wks                 NST qd                 Weekly BPP                 Primary C/S with BTL regardless of presentation or GA         PPROM of A- -- s/p latency abx.              -- S/P magnesium sulfate x 12 hours for NP.              -- BMZ complete (12/20 & 21).               -- S/PNICU consult.             -- s/p extensive counseling.             --Plan delivery at 34 weeks or earlier prn         DVT ppx-SCD                       -heparin 7,500 U q12 hours         CHTN-currently on labetalol 800mg  BID and nifedipine 30mg  XL qd                     Was light headed on labetalol 800mg  TID and dose decreased to BID. This am BP up. Will observe and if it remains elevated will change labetalol to 600mg  TID          Elevated 1 hour GTT in office. Three hour GTT underway this am          Chronic anemia-S/P IV iron

## 2020-01-18 NOTE — Progress Notes (Signed)
Pt given glucola to drink for 3hr GTT after fasting glucose drawn.

## 2020-01-18 NOTE — Progress Notes (Signed)
Nutrition  Noted elevated results of 3 hours GTT  Diet order changed to CHO modified gestational diabetic diet.  CHO count increased slightly in order to meet est needs of pt with this BMI and twins  45 g CHO at B'fast, 60 g CHO and lunch and dinner. Snack CHO count remains at 30 g  Encourage pt to order double protein portions  [employer]Odis Luster LDN Neonatal Nutrition Support Specialist/RD III

## 2020-01-18 NOTE — Progress Notes (Signed)
Inpatient Diabetes Program Recommendations  AACE/ADA: New Consensus Statement on Inpatient Glycemic Control (2015)  Target Ranges:  Prepandial:   less than 140 mg/dL      Peak postprandial:   less than 180 mg/dL (1-2 hours)      Critically ill patients:  140 - 180 mg/dL   Lab Results  Component Value Date   HGBA1C 6.2 07/13/2019    Review of Glycemic Control Results for JESSICCA, STITZER (MRN [id number]) as of 01/18/2020 13:56  Ref. Range 01/18/2020 10:52  Glucose, GTT - 3 Hour Latest Ref Range: 70 - 144 mg/dL 938 (H)     Inpatient Diabetes Program Recommendations:     Noted consult for 28 4/7, twins, pprom, gestational DM.  Please consider: Novolog 0-14 units TID & HS.  Will plan on speaking with patient and follow trends.    Will continue to follow while inpatient.  Thank you, Dulce Sellar, RN, BSN Diabetes Coordinator Inpatient Diabetes Program [phone] (team pager from 8a-5p)

## 2020-01-19 LAB — GLUCOSE, CAPILLARY
Glucose-Capillary: 100 mg/dL — ABNORMAL HIGH (ref 70–99)
Glucose-Capillary: 113 mg/dL — ABNORMAL HIGH (ref 70–99)
Glucose-Capillary: 96 mg/dL (ref 70–99)
Glucose-Capillary: 141 mg/dL — ABNORMAL HIGH (ref 70–99)

## 2020-01-19 MED ORDER — INSULIN ASPART 100 UNIT/ML ~~LOC~~ SOLN: 0.0000 [IU] | Freq: Three times a day (TID) | SUBCUTANEOUS | Status: DC

## 2020-01-19 NOTE — Progress Notes (Signed)
Inpatient Diabetes Program Recommendations  Diabetes Treatment Program Recommendations  ADA Standards of Care 2018 Diabetes in Pregnancy Target Glucose Ranges:  Fasting: 60 - 90 mg/dL Preprandial: 60 - 105 mg/dL 1 hr postprandial: Less than 152m/dL (from first bite of meal) 2 hr postprandial: Less than 120 mg/dL (from first bite of meal)    Lab Results  Component Value Date   GLUCAP 113 (H) 01/19/2020   HGBA1C 6.2 07/13/2019    Review of Glycemic Control Results for VDRIANA, DAZEY(MRN [id number] as of 01/19/2020 14:30  Ref. Range 01/19/2020 08:30 01/19/2020 11:19  Glucose-Capillary Latest Ref Range: 70 - 99 mg/dL 100 (H) 113 (H)   Spoke with patient regarding new diagnosis of GDM.  Explained what a A1c is and what it measures. Also reviewed goal A1c with patient, importance of good glucose control @ home, and blood sugar goals. Reviewed patho of GDM, risk for developing type 2 DM in the future, impact of DM in pregnancy, neonatal hypoglycemia, hyperinsulinemia in the neonate, increased metabolism, hormonal fluctuations that could increase insulin resistance, target goals in pregnancy, postprandial readings, vascular changes and commorbidities. Patient to remain inpatient. Explained recommended frequency. Patient current refusing insulin. Discussed insulin order, current glucose trends, and sliding scale. Currently patient within target goals for pregnancy; will monitor. Dietitian has met with patient. Currently, drinking Dr PMalachi Bondswith case at bedside. Discussed how simple sugars such as Dr PMalachi Bondscould increase glucose trends. Reviewed alternatives. Patient did not seem motivated to make changes.  Has no further questions at this time.   Thanks, LBronson Curb MSN, RNC-OB Diabetes Coordinator [phone](8a-5p)

## 2020-01-19 NOTE — Progress Notes (Signed)
No c/o.  Doing well. FM x 2. Denies PIH s/s, vaginal bleeding or ctxn.   Vitals:   01/19/20 0309 01/19/20 0808  BP: 126/73 133/71  Pulse: 88 90  Resp: 18 18  Temp: 97.6 F (36.4 C) 97.9 F (36.6 C)  SpO2: 100% 95%   Gen: A&O x 3 Abd: soft, non-tender Ext: No edema b/l  NST overnight reactive  34yo G1 at 28.5 wga with mo/di twins, PPROM (Twin A), CHTN - PPROM:   -- s/p latency abx.    -- S/P magnesium sulfate x 12 hours for NP.    -- BMZ complete (12/20 & 21).    -- S/P NICU consult.   -- s/p extensive counseling.  -- Plan delivery at 34 weeks or earlier prn. - Mo/Di twins-Reassuring monitoring  -- NST q day  -- Weekly BPP (q Wed)  -- Patient plans primary C/S with BTL regardless of presentation and GA at delivery.  - CHTN-Continue labetalol 800 BID and Procardia 30XL QD.  - UTI - cont 5 day course of cefdinir - s/s improving - DVT ppx-SCDs prophylactic dose heparin.  - A2GDM  -- diet d/w pt in detail  --BS per protocol  --DM coordinator following  -- SSI 0-14 units aspart TID with meals - Chronic anemia-s/p iron infusion  Belva Agee MD

## 2020-01-19 NOTE — Progress Notes (Signed)
Patient declines insulin at this time.  She was recently diagnosed and wants to try diet for a few days.  Will cancel insulin order and monitor for a week.  Restart insulin prn.  Rosie Fate MD

## 2020-01-20 ENCOUNTER — Inpatient Hospital Stay (HOSPITAL_BASED_OUTPATIENT_CLINIC_OR_DEPARTMENT_OTHER): Payer: No Typology Code available for payment source

## 2020-01-20 DIAGNOSIS — Z148 Genetic carrier of other disease: Secondary | ICD-10-CM

## 2020-01-20 DIAGNOSIS — O30033 Twin pregnancy, monochorionic/diamniotic, third trimester: Secondary | ICD-10-CM | POA: Diagnosis not present

## 2020-01-20 DIAGNOSIS — O99213 Obesity complicating pregnancy, third trimester: Secondary | ICD-10-CM

## 2020-01-20 DIAGNOSIS — O10013 Pre-existing essential hypertension complicating pregnancy, third trimester: Secondary | ICD-10-CM

## 2020-01-20 DIAGNOSIS — O09523 Supervision of elderly multigravida, third trimester: Secondary | ICD-10-CM

## 2020-01-20 DIAGNOSIS — O42913 Preterm premature rupture of membranes, unspecified as to length of time between rupture and onset of labor, third trimester: Secondary | ICD-10-CM

## 2020-01-20 DIAGNOSIS — Z3A28 28 weeks gestation of pregnancy: Secondary | ICD-10-CM

## 2020-01-20 DIAGNOSIS — O09813 Supervision of pregnancy resulting from assisted reproductive technology, third trimester: Secondary | ICD-10-CM

## 2020-01-20 DIAGNOSIS — E669 Obesity, unspecified: Secondary | ICD-10-CM

## 2020-01-20 LAB — GLUCOSE, CAPILLARY
Glucose-Capillary: 113 mg/dL — ABNORMAL HIGH (ref 70–99)
Glucose-Capillary: 111 mg/dL — ABNORMAL HIGH (ref 70–99)
Glucose-Capillary: 109 mg/dL — ABNORMAL HIGH (ref 70–99)
Glucose-Capillary: 138 mg/dL — ABNORMAL HIGH (ref 70–99)

## 2020-01-20 MED ORDER — GLYBURIDE 2.5 MG PO TABS
2.5000 mg | ORAL_TABLET | Freq: Every day | ORAL | Status: DC
  Administered 2020-01-20 – 2020-01-24 (×5): 2.5 mg via ORAL
  Filled 2020-01-20 (×6): qty 1

## 2020-01-20 NOTE — Progress Notes (Signed)
PROBLEM LIST:  1 MONO/DI TWINS AT 28 W 6 DAYS  2 PPROM OF A ON 12/20  3 GDM  4 CHRONIC HYPERTENSION  5 ANEMIA   S:  Patient is doing well. No complaints. Requesting wheelchair ride to courtyard for fresh air. Good FM.  O:  BP 127/77 (BP Location: Left Arm)   Pulse 87   Temp 98.4 F (36.9 C) (Oral)   Resp 16   Ht 5\' 11"  (1.803 m)   Wt (!) 158.8 kg   LMP 07/02/2019   SpO2 99%   BMI 48.82 kg/m  Results for orders placed or performed during the hospital encounter of 01/04/20 (from the past 24 hour(s))  Glucose, capillary     Status: Abnormal   Collection Time: 01/19/20 11:19 AM  Result Value Ref Range   Glucose-Capillary 113 (H) 70 - 99 mg/dL  Glucose, capillary     Status: None   Collection Time: 01/19/20  5:21 PM  Result Value Ref Range   Glucose-Capillary 96 70 - 99 mg/dL  Glucose, capillary     Status: Abnormal   Collection Time: 01/19/20  9:10 PM  Result Value Ref Range   Glucose-Capillary 141 (H) 70 - 99 mg/dL  Glucose, capillary     Status: Abnormal   Collection Time: 01/20/20  6:14 AM  Result Value Ref Range   Glucose-Capillary 113 (H) 70 - 99 mg/dL   Abdomen is soft and non tender  Scheduled Meds: . aspirin  81 mg Oral Daily  . cefdinir  300 mg Oral Q12H  . docusate sodium  100 mg Oral Daily  . glyBURIDE  2.5 mg Oral QHS  . heparin injection (subcutaneous)  7,500 Units Subcutaneous Q12H  . labetalol  800 mg Oral BID  . NIFEdipine  30 mg Oral Daily  . pantoprazole  40 mg Oral Daily  . prenatal multivitamin  1 tablet Oral Q1200  . sertraline  50 mg Oral Daily  . sodium chloride flush  3 mL Intravenous Q12H   Continuous Infusions: . sodium chloride 250 mL (01/11/20 1354)   PRN Meds:.sodium chloride, acetaminophen, calcium carbonate, simethicone   IMPRESSION:  1 - Mono / Di Twin pregnancy at 36 w 6 days  For C Section and BTL   2 - PPROM of twin a  Status post magnesium, steroids, latency antibiotics Currently no sign of  chorioamnionitis Continue bpp - today and weekly nst every day   3 - GDM FBS still elevated Recommend adding Glyburide at bedtime  Patient declined insulin yesterday  4 - Chronic Hypertension Continue Labetalol and Procardia  5 - DVT prophylaxis Continue SCD/heparin  6 - Anemia  Improved

## 2020-01-21 LAB — TYPE AND SCREEN
ABO/RH(D): O POS
Antibody Screen: NEGATIVE

## 2020-01-21 LAB — GLUCOSE, CAPILLARY
Glucose-Capillary: 93 mg/dL (ref 70–99)
Glucose-Capillary: 93 mg/dL (ref 70–99)
Glucose-Capillary: 96 mg/dL (ref 70–99)
Glucose-Capillary: 95 mg/dL (ref 70–99)

## 2020-01-21 NOTE — Progress Notes (Signed)
Antepartum Progress Note  Cheryl Ayala is a 35 y.o. G1P0 with Mo-Di twins, admitted for PPROM baby A.  Also followed for cHTN.   Patient reports no overnight events. Doing well. FM x 2. Denies PIH s/s, vaginal bleeding or ctxn. Still has slow leaking of clear fluid.   Vitals:   01/20/20 2243 01/21/20 0625  BP: (!) 140/95 (!) 114/43  Pulse: 91 89  Resp: 17 18  Temp: 97.8 F (36.6 C) (!) 97.4 F (36.3 C)  SpO2: 99% 99%   Gen: A&O x 3 Abd: soft, non-tender Ext: No edema b/l  NST overnight reactive  34yo G1 at 29.0 wga with mo/di twins, PPROM (Twin A), CHTN - PPROM:   -- s/p latency abx.    -- S/P magnesium sulfate x 12 hours for NP.    -- BMZ complete (12/20 & 21).    -- S/P NICU consult.   -- s/p extensive counseling.  -- Plan delivery at 34 weeks or earlier prn. - Mo/Di twins-Reassuring monitoring  -- NST q day  -- Weekly BPP (q Wed)  -- Patient plans primary C/S with BTL regardless of presentation and GA at delivery.  - CHTN-Continue labetalol 800 BID and Procardia 30XL QD.  - UTI - cont 5 day course of cefdinir - s/s improving - DVT ppx-SCDs prophylactic dose heparin.  - A2GDM  -- diet d/w pt in detail  --BS per protocol  --DM coordinator following  --glyburide added at bedtime, patient previously declined insulin - Chronic anemia-s/p iron infusion - improved

## 2020-01-22 ENCOUNTER — Inpatient Hospital Stay (HOSPITAL_BASED_OUTPATIENT_CLINIC_OR_DEPARTMENT_OTHER): Payer: No Typology Code available for payment source

## 2020-01-22 DIAGNOSIS — O09813 Supervision of pregnancy resulting from assisted reproductive technology, third trimester: Secondary | ICD-10-CM

## 2020-01-22 DIAGNOSIS — O99213 Obesity complicating pregnancy, third trimester: Secondary | ICD-10-CM

## 2020-01-22 DIAGNOSIS — Z3A29 29 weeks gestation of pregnancy: Secondary | ICD-10-CM

## 2020-01-22 DIAGNOSIS — E669 Obesity, unspecified: Secondary | ICD-10-CM

## 2020-01-22 DIAGNOSIS — Z148 Genetic carrier of other disease: Secondary | ICD-10-CM

## 2020-01-22 DIAGNOSIS — O288 Other abnormal findings on antenatal screening of mother: Secondary | ICD-10-CM | POA: Diagnosis not present

## 2020-01-22 DIAGNOSIS — O10013 Pre-existing essential hypertension complicating pregnancy, third trimester: Secondary | ICD-10-CM

## 2020-01-22 DIAGNOSIS — O30033 Twin pregnancy, monochorionic/diamniotic, third trimester: Secondary | ICD-10-CM

## 2020-01-22 DIAGNOSIS — O42913 Preterm premature rupture of membranes, unspecified as to length of time between rupture and onset of labor, third trimester: Secondary | ICD-10-CM | POA: Diagnosis not present

## 2020-01-22 DIAGNOSIS — O09523 Supervision of elderly multigravida, third trimester: Secondary | ICD-10-CM

## 2020-01-22 DIAGNOSIS — O352XX0 Maternal care for (suspected) hereditary disease in fetus, not applicable or unspecified: Secondary | ICD-10-CM

## 2020-01-22 LAB — GLUCOSE, CAPILLARY
Glucose-Capillary: 83 mg/dL (ref 70–99)
Glucose-Capillary: 145 mg/dL — ABNORMAL HIGH (ref 70–99)
Glucose-Capillary: 95 mg/dL (ref 70–99)
Glucose-Capillary: 101 mg/dL — ABNORMAL HIGH (ref 70–99)

## 2020-01-22 MED ORDER — FERROUS SULFATE 325 (65 FE) MG PO TABS
325.0000 mg | ORAL_TABLET | ORAL | Status: DC
  Administered 2020-01-24: 325 mg via ORAL
  Filled 2020-01-22 (×3): qty 1

## 2020-01-22 MED ORDER — HEPARIN SODIUM (PORCINE) 10000 UNIT/ML IJ SOLN
10000.0000 [IU] | Freq: Two times a day (BID) | INTRAMUSCULAR | Status: DC
  Administered 2020-01-22 – 2020-01-24 (×5): 10000 [IU] via SUBCUTANEOUS
  Filled 2020-01-22 (×8): qty 1

## 2020-01-22 NOTE — Progress Notes (Signed)
No c/o.  Active FM x 2.  No CTX or VB.  Occasional gush of fluid.  No subjective fever or chills.  No abdominal tenderness.    Vitals:   01/22/20 0558 01/22/20 0744  BP: (!) 107/56 129/88  Pulse: 88 89  Resp: 16 17  Temp: 98.1 F (36.7 C) 97.8 F (36.6 C)  SpO2: 100% 98%   Cat I NST x 2  Gen: A&O x 3 Abd: soft, non-tender Ext: no c/c/e.    Last u/s 1/5 BPP 8/8 x 2.  Last growth 12/27 with 6% growth discordance  34yo G1 at [redacted]w[redacted]d with mo/di twins, PPROM (Twin A), CHTN, A2DM -PPROM: s/p latency abx. S/P magnesium sulfate x 12 hours for NP. BMZ mature (12/20 & 21).  S/PNICU consult.Patient is extensively counseled re: complication of PPROM including infection, PTL, and abruption. Patient to closely monitor for s/sx of these. Plan delivery at 34 weeks or earlier prn. -Mo/Di twins-Reassuring monitoring. Patient plans primary C/S with BTL regardless of presentation and GA at delivery. BPP weekly on Wednesdays.  Next growth u/s due 1/17.   -CHTN-Continue labetalol 800 BID and Procardia 30XL QD. Excellent control. -DVT ppx-SCDs and prophylactic dose heparin; increased to 10,000 units SQ BID since in third trimester.  PTT ordered in one week. -A2DM-glyburide 2.5 mg qhs with excellent control.  Patient declines insulin. -UTI-cefdinir D -Chronic anemia-s/p iron infusion and will start qod FeSO4  Linda Hedges, DO

## 2020-01-23 LAB — CBC WITH DIFFERENTIAL/PLATELET
Abs Immature Granulocytes: 0.34 10*3/uL — ABNORMAL HIGH (ref 0.00–0.07)
Basophils Absolute: 0.1 10*3/uL (ref 0.0–0.1)
Basophils Relative: 1 %
Eosinophils Absolute: 0.1 10*3/uL (ref 0.0–0.5)
Eosinophils Relative: 1 %
HCT: 31.8 % — ABNORMAL LOW (ref 36.0–46.0)
Hemoglobin: 9.7 g/dL — ABNORMAL LOW (ref 12.0–15.0)
Immature Granulocytes: 2 %
Lymphocytes Relative: 24 %
Lymphs Abs: 3.3 10*3/uL (ref 0.7–4.0)
MCH: 23.7 pg — ABNORMAL LOW (ref 26.0–34.0)
MCHC: 30.5 g/dL (ref 30.0–36.0)
MCV: 77.8 fL — ABNORMAL LOW (ref 80.0–100.0)
Monocytes Absolute: 0.7 10*3/uL (ref 0.1–1.0)
Monocytes Relative: 5 %
Neutro Abs: 9.4 10*3/uL — ABNORMAL HIGH (ref 1.7–7.7)
Neutrophils Relative %: 67 %
Platelets: 328 10*3/uL (ref 150–400)
RBC: 4.09 MIL/uL (ref 3.87–5.11)
RDW: 18.2 % — ABNORMAL HIGH (ref 11.5–15.5)
WBC: 14 10*3/uL — ABNORMAL HIGH (ref 4.0–10.5)
nRBC: 0.2 % (ref 0.0–0.2)

## 2020-01-23 LAB — GLUCOSE, CAPILLARY
Glucose-Capillary: 93 mg/dL (ref 70–99)
Glucose-Capillary: 119 mg/dL — ABNORMAL HIGH (ref 70–99)
Glucose-Capillary: 146 mg/dL — ABNORMAL HIGH (ref 70–99)
Glucose-Capillary: 101 mg/dL — ABNORMAL HIGH (ref 70–99)

## 2020-01-23 NOTE — Progress Notes (Signed)
No c/o.  Active FM x 2.  No CTX or VB.  Occasional gush of fluid.  No fever or chills.  No fundal tenderness.    Vitals:   01/22/20 2159 01/23/20 0730  BP: (!) 149/98 137/73  Pulse: 96 88  Resp: 19 18  Temp: 97.7 F (36.5 C) 97.7 F (36.5 C)  SpO2: 99% 98%    CBC    Component Value Date/Time   WBC 14.0 (H) 01/23/2020 0645   RBC 4.09 01/23/2020 0645   HGB 9.7 (L) 01/23/2020 0645   HCT 31.8 (L) 01/23/2020 0645   PLT 328 01/23/2020 0645   MCV 77.8 (L) 01/23/2020 0645   MCH 23.7 (L) 01/23/2020 0645   MCHC 30.5 01/23/2020 0645   RDW 18.2 (H) 01/23/2020 0645   LYMPHSABS 3.3 01/23/2020 0645   MONOABS 0.7 01/23/2020 0645   EOSABS 0.1 01/23/2020 0645   BASOSABS 0.1 01/23/2020 0645   CBG (last 3)  Recent Labs    01/22/20 2139 01/23/20 0601 01/23/20 1108  GLUCAP 101* 93 119*     NST non-reactive last night; BPP 8/8 x 2  Gen: A&O x 3 Abd: soft, non-tender Ext: no c/c/e.   Last u/s 1/5 BPP 8/8 x 2.  Last growth 12/27 with 6% growth discordance  34yo G1 at [redacted]w[redacted]d with mo/di twins, PPROM (Twin A), CHTN, A2DM -PPROM:s/platency abx.S/P magnesium sulfate x 12 hours for NP. BMZ mature(12/20 &21). S/PNICU consult.Patient is extensively counseled re: complication of PPROM including infection, PTL, and abruption. Patient to closely monitor for s/sx of these. Plan delivery at 34 weeks or earlier prn. -Mo/Di twins-Reassuring monitoring. Patient plans primary C/S with BTL regardless of presentation and GA at delivery.BPP weekly on Wednesdays.  Next growth u/s due 1/17.   -CHTN-Continue labetalol 800 BIDand Procardia 30XL QD. Excellent control. -DVT ppx-SCDsand prophylactic dose heparin -A2DM-glyburide 2.5 mg qhs with excellent control.  Patient declines insulin. -UTI-s/p 5 day course cefdinir (completed yesterday) -Chronic anemia-s/p iron infusion FeSO4 325 qod  Linda Hedges, DO

## 2020-01-23 NOTE — Lactation Note (Addendum)
Lactation Consultation Note  Patient Name: EMMERSEN GARRAWAY [id number] Date: 01/23/2020   Age:35 y.o.  (PN-LC consult)  [place] informed [facility] that she is expecting twins and plans to have a scheduled C/S around [redacted] weeks gestation. Jenicka is PN woman at 69 w 2 days and she was  unable to attend any BF classes in her pregnancy. LC discussed the  benefits of breastfeeding for mom and baby. What to expect with multiples and breastfeeding,  and infant's in  NICU. LC discussed the first few days / week how breast  milk changes from colostrum to mature milk. Informed mom if her and infants are separated because of medical condition, she can still BF and provide EBM to twins. Mom is [employer] Employee and [place] discussed  what DEBP will be available to her. LC discussed breastfeeding positions  with multiples and latching ( twins)  at the breast. LC used booklet " Understanding Breastfeeding -Your Guide to a Healthy Start." Maidie understands if she has any breastfeeding  questions or concerns she can call [place] services.  (No charge for [facility] services) Maternal Data    Feeding    LATCH Score                   Interventions    Lactation Tools Discussed/Used     Consult Status      Vicente Serene 01/23/2020, 7:17 PM

## 2020-01-24 ENCOUNTER — Inpatient Hospital Stay (HOSPITAL_BASED_OUTPATIENT_CLINIC_OR_DEPARTMENT_OTHER): Payer: No Typology Code available for payment source

## 2020-01-24 DIAGNOSIS — O30039 Twin pregnancy, monochorionic/diamniotic, unspecified trimester: Secondary | ICD-10-CM | POA: Diagnosis not present

## 2020-01-24 DIAGNOSIS — O288 Other abnormal findings on antenatal screening of mother: Secondary | ICD-10-CM

## 2020-01-24 DIAGNOSIS — O352XX0 Maternal care for (suspected) hereditary disease in fetus, not applicable or unspecified: Secondary | ICD-10-CM

## 2020-01-24 DIAGNOSIS — O99213 Obesity complicating pregnancy, third trimester: Secondary | ICD-10-CM

## 2020-01-24 DIAGNOSIS — O09813 Supervision of pregnancy resulting from assisted reproductive technology, third trimester: Secondary | ICD-10-CM

## 2020-01-24 DIAGNOSIS — E669 Obesity, unspecified: Secondary | ICD-10-CM

## 2020-01-24 DIAGNOSIS — O10013 Pre-existing essential hypertension complicating pregnancy, third trimester: Secondary | ICD-10-CM

## 2020-01-24 DIAGNOSIS — O30033 Twin pregnancy, monochorionic/diamniotic, third trimester: Secondary | ICD-10-CM

## 2020-01-24 DIAGNOSIS — O42913 Preterm premature rupture of membranes, unspecified as to length of time between rupture and onset of labor, third trimester: Secondary | ICD-10-CM

## 2020-01-24 DIAGNOSIS — Z3A29 29 weeks gestation of pregnancy: Secondary | ICD-10-CM

## 2020-01-24 DIAGNOSIS — O4103X1 Oligohydramnios, third trimester, fetus 1: Secondary | ICD-10-CM | POA: Diagnosis not present

## 2020-01-24 DIAGNOSIS — Z148 Genetic carrier of other disease: Secondary | ICD-10-CM

## 2020-01-24 DIAGNOSIS — O09523 Supervision of elderly multigravida, third trimester: Secondary | ICD-10-CM

## 2020-01-24 LAB — TYPE AND SCREEN
ABO/RH(D): O POS
Antibody Screen: NEGATIVE

## 2020-01-24 LAB — GLUCOSE, CAPILLARY
Glucose-Capillary: 90 mg/dL (ref 70–99)
Glucose-Capillary: 141 mg/dL — ABNORMAL HIGH (ref 70–99)
Glucose-Capillary: 96 mg/dL (ref 70–99)
Glucose-Capillary: 96 mg/dL (ref 70–99)

## 2020-01-24 MED ORDER — MAGNESIUM SULFATE 40 GM/1000ML IV SOLN
2.0000 g/h | INTRAVENOUS | Status: DC
  Administered 2020-01-24: 2 g/h via INTRAVENOUS
  Filled 2020-01-24: qty 1000

## 2020-01-24 MED ORDER — LACTATED RINGERS IV SOLN: INTRAVENOUS | Status: DC

## 2020-01-24 MED ORDER — MAGNESIUM SULFATE BOLUS VIA INFUSION
4.0000 g | Freq: Once | INTRAVENOUS | Status: AC
  Administered 2020-01-24: 4 g via INTRAVENOUS
  Filled 2020-01-24: qty 1000

## 2020-01-24 NOTE — Progress Notes (Signed)
No c/o.  Active FM x 2.  No signs or symptoms of infection, labor or abruption.  Vitals:   01/24/20 0603 01/24/20 0748  BP: 125/66 138/79  Pulse: 90 89  Resp: 18 18  Temp: 98.2 F (36.8 C) 97.6 F (36.4 C)  SpO2: 99% 97%    CBG (last 3)  Recent Labs    01/23/20 1613 01/23/20 2222 01/24/20 0601  GLUCAP 146* 101* 90    NST reactive x 2  Gen: A&O x 3 Abd: soft, non-tender Ext: no c/c/e.   Last u/s 1/5 BPP 8/8 x 2. Last growth 12/27 with 6% growth discordance  35yo G1 at [redacted]w[redacted]d with mo/di twins, PPROM (Twin A), CHTN, A2DM -PPROM:s/platency abx.S/P magnesium sulfate x 12 hours for NP. BMZ mature(12/20 &21). S/PNICU consult.Patient is extensively counseled re: complication of PPROM including infection, PTL, and abruption. Patient to closely monitor for s/sx of these. Plan delivery at 34 weeks or earlier prn.  Planning C/S with BTL for delivery regardless of gestational age.  Patient would like wound vac applied postop. -Mo/Di twins-Reassuring monitoring. BPP weekly on Wednesdays. Next growth u/s due 1/17.  -CHTN-Continue labetalol 800BIDand Procardia 30XL QD.Excellent control. -DVT ppx-SCDsandprophylactic dose heparin -A2DM-glyburide 2.5 mg qhs with excellent control. Patient declines insulin. -Chronic anemia-s/p iron infusionFeSO4 325 qod  Linda Hedges, DO

## 2020-01-24 NOTE — Progress Notes (Signed)
PC received from Dr. Lynnette Caffey to HOLD 10 PM dose of heparin.

## 2020-01-24 NOTE — Progress Notes (Addendum)
I received a call from Dr. Donalee Citrin regarding recent BPP.  Earlier this PM, around 2 pm, BPP 4/10 (NST, fluid and breathing in Twin A).  Plan was made to repeat BPP at 8 pm tonight.  Again, BPP 4/10 but this time, Twin A was breathing but 2 off for tone.  Recommendation by Dr. Donalee Citrin was to deliver in the morning.  OR was called and patient posted at 730.    Patient counseled re: this recommendation.  She is counseled re: risk of bleeding, infection, scarring and damage to surrounding structures.  She is counseled re: implication in future pregnancies including risk of uterine rupture and abnormal placentation.  Patient previously strongly expressed desire for BTL.  I again asked if she would like this and she states that she does.  She understands that given early gestational age and non-reassuring monitoring of Twin A, babies are likely to have a long NICU stay and uncertain associated morbidity and possibly mortality.  She reports that she had IVF with this pregnancy and has more embryos that she can use if she changes her mind.  All questions were answered and we will put the twins on continuous monitoring until delivery.    Last heparin dose (10,000 units for DVT prophylaxis was given this am at 0942).  Patient has been made NPO.  T&S was drawn this morning and CBC is ordered.  Will continue to check CBG per routine orders as patient is A2DM on glyburide 2.5 mg QHS.  Patient declines another NICU consult.  Dr. Donalee Citrin sees no benefit to second course of BMZ (last given 12/20 and 21).  Will start magnesium sulfate for neuroprotection.    Linda Hedges, DO

## 2020-01-25 ENCOUNTER — Inpatient Hospital Stay (HOSPITAL_COMMUNITY): Payer: No Typology Code available for payment source | Admitting: Anesthesiology

## 2020-01-25 ENCOUNTER — Encounter (HOSPITAL_COMMUNITY): Payer: Self-pay | Admitting: Obstetrics and Gynecology

## 2020-01-25 ENCOUNTER — Encounter (HOSPITAL_COMMUNITY)
Admission: AD | Disposition: A | Discharge status: Home / Self Care | Payer: Self-pay | Source: Home / Self Care | Attending: Obstetrics and Gynecology

## 2020-01-25 LAB — CBC WITH DIFFERENTIAL/PLATELET
Abs Immature Granulocytes: 0.36 10*3/uL — ABNORMAL HIGH (ref 0.00–0.07)
Basophils Absolute: 0.1 10*3/uL (ref 0.0–0.1)
Basophils Relative: 1 %
Eosinophils Absolute: 0.2 10*3/uL (ref 0.0–0.5)
Eosinophils Relative: 1 %
HCT: 30.8 % — ABNORMAL LOW (ref 36.0–46.0)
Hemoglobin: 9.6 g/dL — ABNORMAL LOW (ref 12.0–15.0)
Immature Granulocytes: 3 %
Lymphocytes Relative: 23 %
Lymphs Abs: 3.3 10*3/uL (ref 0.7–4.0)
MCH: 23.9 pg — ABNORMAL LOW (ref 26.0–34.0)
MCHC: 31.2 g/dL (ref 30.0–36.0)
MCV: 76.6 fL — ABNORMAL LOW (ref 80.0–100.0)
Monocytes Absolute: 0.8 10*3/uL (ref 0.1–1.0)
Monocytes Relative: 6 %
Neutro Abs: 9.4 10*3/uL — ABNORMAL HIGH (ref 1.7–7.7)
Neutrophils Relative %: 66 %
Platelets: 372 10*3/uL (ref 150–400)
RBC: 4.02 MIL/uL (ref 3.87–5.11)
RDW: 18.5 % — ABNORMAL HIGH (ref 11.5–15.5)
WBC: 14 10*3/uL — ABNORMAL HIGH (ref 4.0–10.5)
nRBC: 0.2 % (ref 0.0–0.2)

## 2020-01-25 LAB — CBC
HCT: 30.4 % — ABNORMAL LOW (ref 36.0–46.0)
Hemoglobin: 10 g/dL — ABNORMAL LOW (ref 12.0–15.0)
MCH: 24.9 pg — ABNORMAL LOW (ref 26.0–34.0)
MCHC: 32.9 g/dL (ref 30.0–36.0)
MCV: 75.8 fL — ABNORMAL LOW (ref 80.0–100.0)
Platelets: 361 10*3/uL (ref 150–400)
RBC: 4.01 MIL/uL (ref 3.87–5.11)
RDW: 18.5 % — ABNORMAL HIGH (ref 11.5–15.5)
WBC: 19.3 10*3/uL — ABNORMAL HIGH (ref 4.0–10.5)
nRBC: 0.1 % (ref 0.0–0.2)

## 2020-01-25 LAB — CREATININE, SERUM
Creatinine, Ser: 0.75 mg/dL (ref 0.44–1.00)
GFR, Estimated: >60 mL/min (ref >60)

## 2020-01-25 LAB — RESP PANEL BY RT-PCR (FLU A&B, COVID) ARPGX2
Influenza A by PCR: NEGATIVE
Influenza B by PCR: NEGATIVE
SARS Coronavirus 2 by RT PCR: NEGATIVE

## 2020-01-25 LAB — GLUCOSE, CAPILLARY
Glucose-Capillary: 80 mg/dL (ref 70–99)
Glucose-Capillary: 111 mg/dL — ABNORMAL HIGH (ref 70–99)

## 2020-01-25 SURGERY — Surgical Case
Anesthesia: Spinal

## 2020-01-25 MED ORDER — ENOXAPARIN SODIUM 80 MG/0.8ML ~~LOC~~ SOLN
0.5000 mg/kg | Freq: Every day | SUBCUTANEOUS | Status: DC
  Administered 2020-01-25 – 2020-01-27 (×3): 75 mg via SUBCUTANEOUS
  Filled 2020-01-25 (×3): qty 0.8

## 2020-01-25 MED ORDER — METOCLOPRAMIDE HCL 5 MG/ML IJ SOLN
INTRAMUSCULAR | Status: AC
  Filled 2020-01-25: qty 2

## 2020-01-25 MED ORDER — PHENYLEPHRINE HCL (PRESSORS) 10 MG/ML IV SOLN
INTRAVENOUS | Status: DC | PRN
  Administered 2020-01-25: 80 ug via INTRAVENOUS
  Administered 2020-01-25 (×3): 120 ug via INTRAVENOUS
  Administered 2020-01-25: 80 ug via INTRAVENOUS

## 2020-01-25 MED ORDER — ONDANSETRON HCL 4 MG/2ML IJ SOLN: 4.0000 mg | Freq: Three times a day (TID) | INTRAMUSCULAR | Status: DC | PRN

## 2020-01-25 MED ORDER — DEXAMETHASONE SODIUM PHOSPHATE 4 MG/ML IJ SOLN
INTRAMUSCULAR | Status: AC
  Filled 2020-01-25: qty 2

## 2020-01-25 MED ORDER — SCOPOLAMINE 1 MG/3DAYS TD PT72
MEDICATED_PATCH | TRANSDERMAL | Status: DC | PRN
  Administered 2020-01-25: 1 via TRANSDERMAL

## 2020-01-25 MED ORDER — PHENYLEPHRINE HCL-NACL 20-0.9 MG/250ML-% IV SOLN
INTRAVENOUS | Status: AC
  Filled 2020-01-25: qty 250

## 2020-01-25 MED ORDER — GENTAMICIN SULFATE 40 MG/ML IJ SOLN
5.0000 mg/kg | INTRAVENOUS | Status: AC
  Administered 2020-01-25: 750 mg via INTRAVENOUS
  Filled 2020-01-25: qty 19

## 2020-01-25 MED ORDER — FENTANYL CITRATE (PF) 100 MCG/2ML IJ SOLN
INTRAMUSCULAR | Status: DC | PRN
  Administered 2020-01-25: 15 ug via INTRATHECAL
  Administered 2020-01-25: 120 ug via INTRATHECAL

## 2020-01-25 MED ORDER — KETOROLAC TROMETHAMINE 30 MG/ML IJ SOLN
30.0000 mg | Freq: Four times a day (QID) | INTRAMUSCULAR | Status: DC | PRN
  Administered 2020-01-25: 30 mg via INTRAMUSCULAR

## 2020-01-25 MED ORDER — ZOLPIDEM TARTRATE 5 MG PO TABS: 5.0000 mg | ORAL_TABLET | Freq: Every evening | ORAL | Status: DC | PRN

## 2020-01-25 MED ORDER — OXYTOCIN-SODIUM CHLORIDE 30-0.9 UT/500ML-% IV SOLN
INTRAVENOUS | Status: AC
  Filled 2020-01-25: qty 500

## 2020-01-25 MED ORDER — OXYTOCIN-SODIUM CHLORIDE 30-0.9 UT/500ML-% IV SOLN
INTRAVENOUS | Status: DC | PRN
  Administered 2020-01-25: 450 mL via INTRAVENOUS

## 2020-01-25 MED ORDER — CLINDAMYCIN PHOSPHATE 900 MG/50ML IV SOLN
900.0000 mg | INTRAVENOUS | Status: AC
  Administered 2020-01-25: 900 mg via INTRAVENOUS
  Filled 2020-01-25: qty 50

## 2020-01-25 MED ORDER — MENTHOL 3 MG MT LOZG: 1.0000 | LOZENGE | OROMUCOSAL | Status: DC | PRN

## 2020-01-25 MED ORDER — PRENATAL MULTIVITAMIN CH: 1.0000 | ORAL_TABLET | Freq: Every day | ORAL | Status: DC

## 2020-01-25 MED ORDER — FENTANYL CITRATE (PF) 100 MCG/2ML IJ SOLN
INTRAMUSCULAR | Status: AC
  Filled 2020-01-25: qty 2

## 2020-01-25 MED ORDER — BUPIVACAINE IN DEXTROSE 0.75-8.25 % IT SOLN
INTRATHECAL | Status: DC | PRN
  Administered 2020-01-25: 1.8 mL via INTRATHECAL

## 2020-01-25 MED ORDER — NALBUPHINE HCL 10 MG/ML IJ SOLN: 5.0000 mg | INTRAMUSCULAR | Status: DC | PRN

## 2020-01-25 MED ORDER — SODIUM CHLORIDE 0.9% FLUSH: 3.0000 mL | INTRAVENOUS | Status: DC | PRN

## 2020-01-25 MED ORDER — COCONUT OIL OIL: 1.0000 "application " | TOPICAL_OIL | Status: DC | PRN

## 2020-01-25 MED ORDER — SENNOSIDES-DOCUSATE SODIUM 8.6-50 MG PO TABS
2.0000 | ORAL_TABLET | Freq: Every day | ORAL | Status: DC
  Filled 2020-01-25 (×3): qty 2

## 2020-01-25 MED ORDER — SIMETHICONE 80 MG PO CHEW: 80.0000 mg | CHEWABLE_TABLET | ORAL | Status: DC | PRN

## 2020-01-25 MED ORDER — OXYTOCIN-SODIUM CHLORIDE 30-0.9 UT/500ML-% IV SOLN
2.5000 [IU]/h | INTRAVENOUS | Status: AC
  Filled 2020-01-25: qty 500

## 2020-01-25 MED ORDER — ACETAMINOPHEN 325 MG PO TABS
650.0000 mg | ORAL_TABLET | ORAL | Status: DC | PRN
  Administered 2020-01-25 – 2020-01-28 (×4): 650 mg via ORAL
  Filled 2020-01-25 (×4): qty 2

## 2020-01-25 MED ORDER — FENTANYL CITRATE (PF) 100 MCG/2ML IJ SOLN
25.0000 ug | INTRAMUSCULAR | Status: DC | PRN
  Administered 2020-01-25: 25 ug via INTRAVENOUS

## 2020-01-25 MED ORDER — METOCLOPRAMIDE HCL 5 MG/ML IJ SOLN
INTRAMUSCULAR | Status: DC | PRN
  Administered 2020-01-25: 10 mg via INTRAVENOUS

## 2020-01-25 MED ORDER — SOD CITRATE-CITRIC ACID 500-334 MG/5ML PO SOLN
30.0000 mL | Freq: Once | ORAL | Status: AC
  Administered 2020-01-25: 30 mL via ORAL
  Filled 2020-01-25: qty 15

## 2020-01-25 MED ORDER — SIMETHICONE 80 MG PO CHEW
80.0000 mg | CHEWABLE_TABLET | Freq: Three times a day (TID) | ORAL | Status: DC
  Administered 2020-01-25 – 2020-01-26 (×2): 80 mg via ORAL
  Filled 2020-01-25 (×7): qty 1

## 2020-01-25 MED ORDER — NALBUPHINE HCL 10 MG/ML IJ SOLN: 5.0000 mg | Freq: Once | INTRAMUSCULAR | Status: DC | PRN

## 2020-01-25 MED ORDER — KETOROLAC TROMETHAMINE 30 MG/ML IJ SOLN: 30.0000 mg | Freq: Four times a day (QID) | INTRAMUSCULAR | Status: DC | PRN

## 2020-01-25 MED ORDER — LACTATED RINGERS IV SOLN: INTRAVENOUS | Status: DC

## 2020-01-25 MED ORDER — DIPHENHYDRAMINE HCL 25 MG PO CAPS: 25.0000 mg | ORAL_CAPSULE | ORAL | Status: DC | PRN

## 2020-01-25 MED ORDER — MEPERIDINE HCL 25 MG/ML IJ SOLN
6.2500 mg | INTRAMUSCULAR | Status: DC | PRN
Start: 2020-01-25 — End: 2020-01-25

## 2020-01-25 MED ORDER — LABETALOL HCL 200 MG PO TABS
800.0000 mg | ORAL_TABLET | Freq: Two times a day (BID) | ORAL | Status: DC
  Administered 2020-01-25 – 2020-01-28 (×7): 800 mg via ORAL
  Filled 2020-01-25 (×7): qty 4

## 2020-01-25 MED ORDER — ONDANSETRON HCL 4 MG/2ML IJ SOLN
INTRAMUSCULAR | Status: AC
  Filled 2020-01-25: qty 2

## 2020-01-25 MED ORDER — NALOXONE HCL 4 MG/10ML IJ SOLN
1.0000 ug/kg/h | INTRAVENOUS | Status: DC | PRN
  Filled 2020-01-25: qty 5

## 2020-01-25 MED ORDER — STERILE WATER FOR IRRIGATION IR SOLN
Status: DC | PRN
  Administered 2020-01-25: 1

## 2020-01-25 MED ORDER — PHENYLEPHRINE HCL-NACL 20-0.9 MG/250ML-% IV SOLN
INTRAVENOUS | Status: DC | PRN
  Administered 2020-01-25: 60 ug/min via INTRAVENOUS

## 2020-01-25 MED ORDER — FERROUS SULFATE 325 (65 FE) MG PO TABS: 325.0000 mg | ORAL_TABLET | ORAL | Status: DC

## 2020-01-25 MED ORDER — MORPHINE SULFATE (PF) 0.5 MG/ML IJ SOLN
INTRAMUSCULAR | Status: DC | PRN
  Administered 2020-01-25: .15 mg via INTRATHECAL

## 2020-01-25 MED ORDER — SODIUM CHLORIDE 0.9 % IR SOLN
Status: DC | PRN
  Administered 2020-01-25: 1

## 2020-01-25 MED ORDER — PHENYLEPHRINE 40 MCG/ML (10ML) SYRINGE FOR IV PUSH (FOR BLOOD PRESSURE SUPPORT)
PREFILLED_SYRINGE | INTRAVENOUS | Status: AC
  Filled 2020-01-25: qty 10

## 2020-01-25 MED ORDER — TETANUS-DIPHTH-ACELL PERTUSSIS 5-2.5-18.5 LF-MCG/0.5 IM SUSY: 0.5000 mL | PREFILLED_SYRINGE | Freq: Once | INTRAMUSCULAR | Status: DC

## 2020-01-25 MED ORDER — DIPHENHYDRAMINE HCL 25 MG PO CAPS: 25.0000 mg | ORAL_CAPSULE | Freq: Four times a day (QID) | ORAL | Status: DC | PRN

## 2020-01-25 MED ORDER — WITCH HAZEL-GLYCERIN EX PADS
1.0000 | MEDICATED_PAD | CUTANEOUS | Status: DC | PRN
Start: 2020-01-25 — End: 2020-01-28

## 2020-01-25 MED ORDER — KETOROLAC TROMETHAMINE 30 MG/ML IJ SOLN
INTRAMUSCULAR | Status: AC
  Filled 2020-01-25: qty 1

## 2020-01-25 MED ORDER — DIBUCAINE (PERIANAL) 1 % EX OINT: 1.0000 "application " | TOPICAL_OINTMENT | CUTANEOUS | Status: DC | PRN

## 2020-01-25 MED ORDER — NIFEDIPINE ER OSMOTIC RELEASE 30 MG PO TB24
30.0000 mg | ORAL_TABLET | Freq: Every day | ORAL | Status: DC
  Administered 2020-01-26 – 2020-01-28 (×3): 30 mg via ORAL
  Filled 2020-01-25 (×4): qty 1

## 2020-01-25 MED ORDER — PROMETHAZINE HCL 25 MG/ML IJ SOLN: 6.2500 mg | INTRAMUSCULAR | Status: DC | PRN

## 2020-01-25 MED ORDER — NALOXONE HCL 0.4 MG/ML IJ SOLN: 0.4000 mg | INTRAMUSCULAR | Status: DC | PRN

## 2020-01-25 MED ORDER — SERTRALINE HCL 50 MG PO TABS
50.0000 mg | ORAL_TABLET | Freq: Every day | ORAL | Status: DC
  Administered 2020-01-25 – 2020-01-27 (×3): 50 mg via ORAL
  Filled 2020-01-25 (×3): qty 1

## 2020-01-25 MED ORDER — DEXAMETHASONE SODIUM PHOSPHATE 4 MG/ML IJ SOLN
INTRAMUSCULAR | Status: DC | PRN
  Administered 2020-01-25: 8 mg via INTRAVENOUS

## 2020-01-25 MED ORDER — LACTATED RINGERS IV SOLN: INTRAVENOUS | Status: DC | PRN

## 2020-01-25 MED ORDER — ONDANSETRON HCL 4 MG/2ML IJ SOLN
INTRAMUSCULAR | Status: DC | PRN
  Administered 2020-01-25: 4 mg via INTRAVENOUS

## 2020-01-25 MED ORDER — IBUPROFEN 800 MG PO TABS
800.0000 mg | ORAL_TABLET | Freq: Four times a day (QID) | ORAL | Status: DC
  Administered 2020-01-25 – 2020-01-28 (×11): 800 mg via ORAL
  Filled 2020-01-25 (×12): qty 1

## 2020-01-25 MED ORDER — OXYCODONE HCL 5 MG PO TABS
5.0000 mg | ORAL_TABLET | ORAL | Status: DC | PRN
  Administered 2020-01-26 – 2020-01-27 (×3): 5 mg via ORAL
  Filled 2020-01-25 (×4): qty 1

## 2020-01-25 MED ORDER — MORPHINE SULFATE (PF) 0.5 MG/ML IJ SOLN
INTRAMUSCULAR | Status: AC
  Filled 2020-01-25: qty 10

## 2020-01-25 MED ORDER — DIPHENHYDRAMINE HCL 50 MG/ML IJ SOLN: 12.5000 mg | INTRAMUSCULAR | Status: DC | PRN

## 2020-01-25 SURGICAL SUPPLY — 35 items
BENZOIN TINCTURE PRP APPL 2/3 (GAUZE/BANDAGES/DRESSINGS) ×2 IMPLANT
CHLORAPREP W/TINT 26ML (MISCELLANEOUS) ×2 IMPLANT
CLAMP CORD UMBIL (MISCELLANEOUS) IMPLANT
CLOTH BEACON ORANGE TIMEOUT ST (SAFETY) ×2 IMPLANT
DERMABOND ADVANCED (GAUZE/BANDAGES/DRESSINGS)
DERMABOND ADVANCED .7 DNX12 (GAUZE/BANDAGES/DRESSINGS) IMPLANT
DRESSING PREVENA PLUS CUSTOM (GAUZE/BANDAGES/DRESSINGS) ×1 IMPLANT
DRSG OPSITE POSTOP 4X10 (GAUZE/BANDAGES/DRESSINGS) ×2 IMPLANT
DRSG PREVENA PLUS CUSTOM (GAUZE/BANDAGES/DRESSINGS) ×2
ELECT REM PT RETURN 9FT ADLT (ELECTROSURGICAL) ×2
ELECTRODE REM PT RTRN 9FT ADLT (ELECTROSURGICAL) ×1 IMPLANT
EXTRACTOR VACUUM KIWI (MISCELLANEOUS) IMPLANT
GLOVE BIO SURGEON STRL SZ 6 (GLOVE) ×2 IMPLANT
GLOVE BIOGEL PI IND STRL 6 (GLOVE) ×2 IMPLANT
GLOVE BIOGEL PI IND STRL 7.0 (GLOVE) ×1 IMPLANT
GLOVE BIOGEL PI INDICATOR 6 (GLOVE) ×2
GLOVE BIOGEL PI INDICATOR 7.0 (GLOVE) ×1
GOWN STRL REUS W/TWL LRG LVL3 (GOWN DISPOSABLE) ×4 IMPLANT
KIT ABG SYR 3ML LUER SLIP (SYRINGE) ×2 IMPLANT
NEEDLE HYPO 25X5/8 SAFETYGLIDE (NEEDLE) ×2 IMPLANT
NS IRRIG 1000ML POUR BTL (IV SOLUTION) ×2 IMPLANT
PACK C SECTION WH (CUSTOM PROCEDURE TRAY) ×2 IMPLANT
PAD OB MATERNITY 4.3X12.25 (PERSONAL CARE ITEMS) ×2 IMPLANT
PENCIL SMOKE EVAC W/HOLSTER (ELECTROSURGICAL) ×2 IMPLANT
STRIP CLOSURE SKIN 1/2X4 (GAUZE/BANDAGES/DRESSINGS) ×2 IMPLANT
SUT CHROMIC 0 CTX 36 (SUTURE) ×6 IMPLANT
SUT MON AB 2-0 CT1 27 (SUTURE) ×2 IMPLANT
SUT PDS AB 0 CT1 27 (SUTURE) ×2 IMPLANT
SUT PLAIN 0 NONE (SUTURE) IMPLANT
SUT PLAIN 2 0 XLH (SUTURE) ×2 IMPLANT
SUT VIC AB 0 CT1 36 (SUTURE) IMPLANT
SUT VIC AB 4-0 KS 27 (SUTURE) IMPLANT
TOWEL OR 17X24 6PK STRL BLUE (TOWEL DISPOSABLE) ×2 IMPLANT
TRAY FOLEY W/BAG SLVR 14FR LF (SET/KITS/TRAYS/PACK) IMPLANT
WATER STERILE IRR 1000ML POUR (IV SOLUTION) ×2 IMPLANT

## 2020-01-25 NOTE — Anesthesia Preprocedure Evaluation (Addendum)
Anesthesia Evaluation  Patient identified by MRN, date of birth, ID band Patient awake    Reviewed: Allergy & Precautions, NPO status , Patient's Chart, lab work & pertinent test results, reviewed documented beta blocker date and time   Airway Mallampati: III  TM Distance: >3 FB Neck ROM: Full    Dental no notable dental hx. (+) Teeth Intact   Pulmonary neg pulmonary ROS,    Pulmonary exam normal breath sounds clear to auscultation       Cardiovascular hypertension, Pt. on medications and Pt. on home beta blockers Normal cardiovascular exam Rhythm:Regular Rate:Normal     Neuro/Psych PSYCHIATRIC DISORDERS Anxiety Depression ADHDnegative neurological ROS     GI/Hepatic Neg liver ROS, GERD  Medicated and Controlled,Dysphagia   Endo/Other  diabetes, Well Controlled, Gestational, Oral Hypoglycemic AgentsMorbid obesityHyperlipidemia  Renal/GU negative Renal ROS     Musculoskeletal negative musculoskeletal ROS (+)   Abdominal (+) + obese,   Peds  Hematology  (+) anemia ,   Anesthesia Other Findings   Reproductive/Obstetrics (+) Pregnancy Twin gestation 48 weeks Non reassuring FHR tracing SPROM Desires sterilization                          Anesthesia Physical Anesthesia Plan  ASA: III and emergent  Anesthesia Plan: Spinal   Post-op Pain Management:    Induction:   PONV Risk Score and Plan: 4 or greater and Scopolamine patch - Pre-op, Treatment may vary due to age or medical condition and Ondansetron  Airway Management Planned: Natural Airway  Additional Equipment:   Intra-op Plan:   Post-operative Plan:   Informed Consent: I have reviewed the patients History and Physical, chart, labs and discussed the procedure including the risks, benefits and alternatives for the proposed anesthesia with the patient or authorized representative who has indicated his/her understanding and  acceptance.       Plan Discussed with: Anesthesiologist and CRNA  Anesthesia Plan Comments:         Anesthesia Quick Evaluation

## 2020-01-25 NOTE — Lactation Note (Signed)
This note was copied from a baby's chart. Lactation Consultation Note  Patient Name: Cheryl Ayala [id number] Date: 01/25/2020 Reason for consult: Initial assessment;NICU baby;Multiple gestation;Preterm <34wks Age:36 hours  LC to mother's room for initial consult. Pump is set up and mother initiated pumping earlier today. At present, she is resting. Mother denies hx of breast surgery/trauma. 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 LC provided brief ed and will plan f/u tomorrow when after mother has had an opportunity to rest. Patient was provided with the opportunity to ask questions. All concerns were addressed.    Consult Status Consult Status: Follow-up Follow-up type: [place], [place] IBCLC 01/25/2020, 2:20 PM

## 2020-01-25 NOTE — Transfer of Care (Signed)
Immediate Anesthesia Transfer of Care Note  Patient: [name]  Procedure(s) Performed: CESAREAN SECTION (N/A )  Patient Location: PACU  Anesthesia Type:Spinal  Level of Consciousness: awake, alert  and oriented  Airway & Oxygen Therapy: Patient Spontanous Breathing  Post-op Assessment: Report given to RN and Post -op Vital signs reviewed and stable  Post vital signs: Reviewed and stable  Last Vitals:  Vitals Value Taken Time  BP 141/90 01/25/20 1019  Temp    Pulse 78 01/25/20 1020  Resp 15 01/25/20 1020  SpO2 98 % 01/25/20 1020  Vitals shown include unvalidated device data.  Last Pain:  Vitals:   01/25/20 0726  TempSrc:   PainSc: 0-No pain      Patients Stated Pain Goal: 0 (03/47/42 5956)  Complications: No complications documented.

## 2020-01-25 NOTE — Anesthesia Procedure Notes (Signed)
Spinal  Patient location during procedure: OR Start time: 01/25/2020 7:54 AM End time: 01/25/2020 7:57 AM Staffing Performed: anesthesiologist  Anesthesiologist: Josephine Igo, MD Preanesthetic Checklist Completed: patient identified, IV checked, site marked, risks and benefits discussed, surgical consent, monitors and equipment checked, pre-op evaluation and timeout performed Spinal Block Patient position: sitting Prep: DuraPrep and site prepped and draped Patient monitoring: heart rate, cardiac monitor, continuous pulse ox and blood pressure Approach: midline Location: L3-4 Injection technique: single-shot Needle Needle type: Pencan  Needle gauge: 24 G Needle length: 9 cm Needle insertion depth: 8 cm Assessment Sensory level: T4 Additional Notes Patient tolerated procedure well. Adequate sensory level.

## 2020-01-25 NOTE — Progress Notes (Signed)
No change in maternal or fetal status.  Proceed with C/S and BTL.  Linda Hedges, DO

## 2020-01-25 NOTE — Anesthesia Postprocedure Evaluation (Signed)
Anesthesia Post Note  Patient: Cheryl Ayala  Procedure(s) Performed: CESAREAN SECTION (N/A )     Patient location during evaluation: PACU Anesthesia Type: Spinal Level of consciousness: oriented and awake and alert Pain management: pain level controlled Vital Signs Assessment: post-procedure vital signs reviewed and stable Respiratory status: spontaneous breathing, respiratory function stable and nonlabored ventilation Cardiovascular status: blood pressure returned to baseline and stable Postop Assessment: no headache, no backache, no apparent nausea or vomiting, spinal receding and patient able to bend at knees Anesthetic complications: no   No complications documented.  Last Vitals:  Vitals:   01/25/20 1101 01/25/20 1102  BP: (!) 155/91   Pulse: 85 85  Resp: 16 14  Temp:    SpO2: 96% 97%    Last Pain:  Vitals:   01/25/20 1102  TempSrc:   PainSc: 2    Pain Goal: Patients Stated Pain Goal: 0 (01/24/20 1950)  LLE Motor Response: Purposeful movement (01/25/20 1101) LLE Sensation: Numbness,Tingling (01/25/20 1101) RLE Motor Response: Purposeful movement (01/25/20 1101) RLE Sensation: Numbness,Tingling (01/25/20 1101)     Epidural/Spinal Function Cutaneous sensation: Tingles (01/25/20 1045), Patient able to flex knees: Yes (01/25/20 1045), Patient able to lift hips off bed: No (01/25/20 1045), Back pain beyond tenderness at insertion site: No (01/25/20 1045), Progressively worsening motor and/or sensory loss: No (01/25/20 1045), Bowel and/or bladder incontinence post epidural: No (01/25/20 1045)  Sherod Cisse A.

## 2020-01-25 NOTE — Op Note (Signed)
[name] PROCEDURE DATE: 01/04/2020 - 01/25/2020  PREOPERATIVE DIAGNOSIS: Intrauterine pregnancy at  [redacted]w[redacted]d weeks gestation, Mo/di twins with PPROM twin A, non-reassuring monitoring of twin A, A2DM, CHTN, Class III obesity, desire for sterility  POSTOPERATIVE DIAGNOSIS: The same  PROCEDURE:  Primary Low Transverse Cesarean Section, Bilateral Tubal Ligation  SURGEON:  Dr. Linda Hedges  INDICATIONS: Cheryl Ayala is a 35 y.o. G1P0 at [redacted]w[redacted]d scheduled for cesarean section secondary to above.  The risks of cesarean section discussed with the patient included but were not limited to: bleeding which may require transfusion or reoperation; infection which may require antibiotics; injury to bowel, bladder, ureters or other surrounding organs; injury to the fetus; need for additional procedures including hysterectomy in the event of a life-threatening hemorrhage; placental abnormalities wth subsequent pregnancies, incisional problems, thromboembolic phenomenon and other postoperative/anesthesia complications. She is informed of the risk of failure, regret and ectopic with bilateral tubal ligation.  The patient concurred with the proposed plan, giving informed written consent for the procedure.    FINDINGS:  Twin A-Viable female infant in cephalic presentation, APGARs 5,8: weight pending  clear amniotic fluid.  Twin B-Viable female infant in cephalic presentation, APGARs pending, clear amniotic fluid.  Intact placenta, three vessel cord.  Grossly normal uterus, ovaries and fallopian tubes. .   ANESTHESIA: Spinal ESTIMATED BLOOD LOSS: 462 ml SPECIMENS: Placenta and bilateral tubal segments sent to pathology COMPLICATIONS: None immediate  PROCEDURE IN DETAIL:  The patient received intravenous antibiotics and had sequential compression devices applied to her lower extremities while in the preoperative area.  She was then taken to the operating room where spinal anesthesia was administered and was found  to be adequate. She was then placed in a dorsal supine position with a leftward tilt, and prepped and draped in a sterile manner.  A foley catheter was placed into her bladder and attached to constant gravity.  Traxi retractor was applied.  Abdominal prep was performed.  After an adequate timeout was performed, a Pfannenstiel skin incision was made with scalpel and carried through to the underlying layer of fascia. The fascia was incised in the midline and this incision was extended bilaterally using the Mayo scissors. Kocher clamps were applied to the superior aspect of the fascial incision and the underlying rectus muscles were dissected off bluntly. A similar process was carried out on the inferior aspect of the facial incision. The rectus muscles were separated in the midline bluntly and the peritoneum was entered bluntly.   A transverse hysterotomy was made with a scalpel and extended bilaterally bluntly. The bladder blade was then removed. Twin A was successfully delivered, and cord was clamped and cut and infant was handed over to awaiting neonatology team. Attention was then turned to Twin B.  Difficulty with fundal pressure necessitated use of Kiwi vaccuum for Twin B delivery.  There were 3 pop offs and ultimately, Dr. Gaetano Net was asked to come into the OR for assistance and was able to deliver Twin B with vaccuum assistance.  The cord was clamped and cut and infant was handed over to awaiting neonatology team.  Uterine massage was then administered and the placenta delivered intact with three-vessel cord x 2. The uterus was cleared of clot and debris.  The hysterotomy was closed with 0 chromic.  A second imbricating suture of 0-chromic was used to reinforce the incision and aid in hemostasis.  Attention was then turned to the fallopian tubes.  The left tube was elevated, doubly tied with plain  gut.  Tubal knuckle was excised with excellent hemostasis noted.  The contralateral tube was treated in the same  fashion.  The peritoneum and rectus muscles were noted to be hemostatic and were reapproximated using 3-0 monocryl in a running fashion.  The fascia was closed with 0-PDS in a running fashion with good restoration of anatomy.  The subcutaneus tissue was copiously irrigated and reapproximated using plain gut suture in three interrupted stitches.  The skin was closed with 4-0 vicryl in a subcuticular stitch.  Wound vac was placed.    Pt tolerated the procedure will.  All counts were correct x2.  Pt went to the recovery room in stable condition.

## 2020-01-26 ENCOUNTER — Ambulatory Visit: Payer: No Typology Code available for payment source

## 2020-01-26 LAB — CBC
HCT: 26.6 % — ABNORMAL LOW (ref 36.0–46.0)
Hemoglobin: 8.5 g/dL — ABNORMAL LOW (ref 12.0–15.0)
MCH: 24.7 pg — ABNORMAL LOW (ref 26.0–34.0)
MCHC: 32 g/dL (ref 30.0–36.0)
MCV: 77.3 fL — ABNORMAL LOW (ref 80.0–100.0)
Platelets: 316 10*3/uL (ref 150–400)
RBC: 3.44 MIL/uL — ABNORMAL LOW (ref 3.87–5.11)
RDW: 18.7 % — ABNORMAL HIGH (ref 11.5–15.5)
WBC: 16.6 10*3/uL — ABNORMAL HIGH (ref 4.0–10.5)
nRBC: 0.2 % (ref 0.0–0.2)

## 2020-01-26 MED ORDER — PANTOPRAZOLE SODIUM 40 MG PO TBEC
40.0000 mg | DELAYED_RELEASE_TABLET | Freq: Every day | ORAL | Status: DC
  Administered 2020-01-26 – 2020-01-28 (×3): 40 mg via ORAL
  Filled 2020-01-26 (×3): qty 1

## 2020-01-26 NOTE — Plan of Care (Signed)
Patient ambulating, passing flatus and voiding. Pain management discussed.  Problem: Clinical Measurements: Goal: Ability to maintain clinical measurements within normal limits will improve Outcome: Progressing Goal: Will remain free from infection Outcome: Progressing Goal: Diagnostic test results will improve Outcome: Progressing Goal: Respiratory complications will improve Outcome: Progressing Goal: Cardiovascular complication will be avoided Outcome: Progressing   Problem: Coping: Goal: Ability to identify and utilize available resources and services will improve Outcome: Progressing   Problem: Life Cycle: Goal: Chance of risk for complications during the postpartum period will decrease Outcome: Progressing   Problem: Role Relationship: Goal: Ability to demonstrate positive interaction with newborn will improve Outcome: Progressing

## 2020-01-26 NOTE — Lactation Note (Signed)
This note was copied from a baby's chart. Lactation Consultation Note  Patient Name: Cheryl Ayala [id number] Date: 01/26/2020 Reason for consult: NICU baby;Follow-up assessment;Multiple gestation Age:36 hours  LC to room for f/u visit. [place] provided mother with written information regarding employee pump. Patient will make decision and receive pump prior to d/c. Pt is pumping without difficulty and had no questions/concerns at time of visit. Patient was provided with the opportunity to ask questions. All concerns were addressed.  Will plan follow up visit.    Consult Status Consult Status: Follow-up Follow-up type: In-patient    Gwynne Edinger, MA IBCLC 01/26/2020, 12:40 PM

## 2020-01-26 NOTE — Progress Notes (Signed)
Subjective: Postpartum Day 1: Cesarean Delivery Patient reports incisional pain, tolerating PO and no problems voiding.    Objective: Vital signs in last 24 hours: Temp:  [97.6 F (36.4 C)-98.2 F (36.8 C)] 97.8 F (36.6 C) (01/11 0758) Pulse Rate:  [65-92] 65 (01/11 0758) Resp:  [14-21] 19 (01/11 0758) BP: (105-155)/(60-100) 136/74 (01/11 0758) SpO2:  [95 %-100 %] 96 % (01/11 0758)  Physical Exam:  General: alert, cooperative, appears stated age and no distress Lochia: appropriate Uterine Fundus: firm Incision: healing well, wound vac in place DVT Evaluation: No evidence of DVT seen on physical exam.  Recent Labs    01/25/20 1220 01/26/20 0725  HGB 10.0* 8.5*  HCT 30.4* 26.6*    Assessment/Plan: Status post Cesarean section. Doing well postoperatively.  BP well controlled on current meds Babys doing well in NICU on CPAP Lovenox for PPx Encouraged to get covid vax .  Luz Lex 01/26/2020, 10:31 AM

## 2020-01-26 NOTE — Clinical Social Work Maternal (Signed)
CLINICAL SOCIAL WORK MATERNAL/CHILD NOTE  Patient Details  Name: Cheryl Ayala MRN: [id number] Date of Birth: May 04, 1985  Date:  01/26/2020  Clinical Social Worker Initiating Note:  Abundio Miu, [place] Date/Time: Initiated:  01/26/20/1233     Child's Name:  Boy A: Cheryl Ayala Boy B: Cheryl Ayala   Biological Parents:  Mother,Father (Father: Cheryl Ayala)   Need for Interpreter:  None   Reason for Referral:  Parental Support of Premature Babies < 32 weeks/or Critically Ill babies   Address:  [place] [address]    Phone number:  [phone] (home) [phone] (work)    Additional phone number:   Household Members/Support Persons (HM/SP):   Household Member/Support Person 1   HM/SP Name Relationship DOB or Age  HM/SP -[place] [name]    HM/SP -2        HM/SP -3        HM/SP -4        HM/SP -5        HM/SP -6        HM/SP -7        HM/SP -8          Natural Supports (not living in the home):  Immediate Family,Extended Family   Professional Supports: None   Employment: Full-time   Type of Work: Psychologist, sport and exercise   Education:  [place] arranged:    Museum/gallery curator Resources:  Multimedia programmer   Other Resources:      Cultural/Religious Considerations Which May Impact Care:    Strengths:  Understanding of illness,Ability to meet basic needs ,Psychotropic Medications   Psychotropic Medications:  Zoloft      Pediatrician:       Pediatrician List:   [place]      Pediatrician Fax Number:    Risk Factors/Current Problems:  Mental Health Concerns    Cognitive State:  Able to Concentrate ,Alert ,Linear Thinking ,Goal Oriented ,Insightful    Mood/Affect:  Calm ,Interested ,Comfortable    CSW Assessment: CSW met with [name] at infants bedside to discuss infant's NICU admission. CSW introduced self and  explained reason for visit. [name] was welcoming and remained engaged during assessment. [name] reported that she resides with [name] and works as a Psychologist, sport and exercise for [employer]. [name] reported that they have cribs and car seats for infants. [name] reported that their baby shower is in a couple of weeks. CSW informed [name] about the Family Support Network [employer] if assistance is needed obtaining items for infants, [name] verbalized understanding. CSW inquired about [name]'s support system, [name] reported that she has tons of support.   CSW inquired about [name]'s mental health history, [name] reported that she was diagnosed with anxiety at age 35. [name] denied any current symptoms, noting that her Zoloft is helpful. [name] reported that she participated in therapy 2019 to 2020 which was helpful. [name] denied any additional mental health history. CSW inquired about how [name] was feeling emotionally after giving birth, [name] reported that her hormones kicked in today. [name] elaborated and explained that looking at infants makes her sad but that she is fine. CSW acknowledged and validated [name]'s feelings and hormones provoking sad emotions in relation to infants NICU admission. [name] presented calm and did not demonstrate any acute mental health signs/symptoms. CSW assessed for safety, [name] denied SI, HI  and domestic violence.   CSW provided education regarding the baby blues period vs. perinatal mood disorders, discussed treatment and gave resources for mental health follow up if concerns arise.  CSW recommends self-evaluation during the postpartum time period using the New Mom Checklist from Postpartum Progress and encouraged [name] to contact a medical professional if symptoms are noted at any time.    CSW provided review of Sudden Infant Death Syndrome (SIDS) precautions.    CSW and [name] discussed infants NICU admission. CSW informed [name] about the NICU, what to expect and resources/supports available while infants are admitted  to the NICU. [name] reported that she feels well informed about infants care and the nurses have been great. [name] denied any transportation barriers with visiting infants in the NICU. [name] denied any questions/concerns regarding the NICU.   CSW will continue to offer resources/suppports while infants are admitted to the NICU.    CSW Plan/Description:  Sudden Infant Death Syndrome (SIDS) Education,Perinatal Mood and Anxiety Disorder (PMADs) Education,Psychosocial Support and Ongoing Assessment of Needs,Other Patient/Family Education    Burnis Medin, LCSW 01/26/2020, 12:41 PM

## 2020-01-27 LAB — SARS CORONAVIRUS 2 (TAT 6-24 HRS): SARS Coronavirus 2: NEGATIVE

## 2020-01-27 MED ORDER — DOCUSATE SODIUM 100 MG PO CAPS
100.0000 mg | ORAL_CAPSULE | Freq: Every day | ORAL | Status: DC
  Filled 2020-01-27 (×2): qty 1

## 2020-01-27 MED ORDER — FERROUS SULFATE 325 (65 FE) MG PO TABS
325.0000 mg | ORAL_TABLET | Freq: Every day | ORAL | Status: DC
  Administered 2020-01-27: 325 mg via ORAL
  Filled 2020-01-27 (×2): qty 1

## 2020-01-27 NOTE — Lactation Note (Signed)
This note was copied from a baby's chart. Lactation Consultation Note  Patient Name: Cheryl Ayala [id number] Date: 01/27/2020 Reason for consult: Follow-up assessment;Primapara;1st time breastfeeding;NICU baby;Preterm <34wks;Multiple gestation Age:36 hours  0940 - 1013 - I followed up with Cheryl Ayala. She reports noticing some breast changes today (beginning day 3). She is pumping droplets of colostrum. She chose her employee pump [facility]) and provided me with the needed documentation. I provided her an employee pump with receipt.   We did a quick flange check and determined that size 27 flanges would be appropriate.  I made a pumping bra out of a belly band for Cheryl Ayala.  Cheryl Ayala reports pumping around 3-4 times a day. I encouraged her to pump 8+ times a day and explained the rationale. She verbalized understanding.  She states that she has all needed supplies as of now. She anticipates discharge tomorrow.   Maternal Data Formula Feeding for Exclusion: No  Feeding Feeding Type: Donor Breast Milk   Interventions Interventions: Breast feeding basics reviewed;Hand pump (Belly band bra)  Lactation Tools Discussed/Used Tools: Pump;Flanges Flange Size: 27 Breast pump type: Double-Electric Breast Pump Pump Education: Setup, frequency, and cleaning   Consult Status Consult Status: Follow-up Date: 01/28/20 Follow-up type: In-patient    Mariadejesus Cade 01/27/2020, 10:17 AM

## 2020-01-27 NOTE — Progress Notes (Signed)
Postpartum Progress Note  Postpartum Day 2 s/p primary Cesarean section for Mo-Di twins, 29weeks  Subjective:  Patient reports no overnight events.  She reports well controlled pain, ambulating without difficulty, voiding spontaneously, tolerating PO.  She reports Positive flatus, Negative BM.  Vaginal bleeding is minimal.  Objective: Blood pressure 123/61, pulse 89, temperature 97.8 F (36.6 C), temperature source Oral, resp. rate 18, height 5\' 7"  (1.702 m), weight (!) 151.5 kg, last menstrual period 07/02/2019, SpO2 98 %, unknown if currently breastfeeding.  Physical Exam:  General: alert and no distress Lochia: appropriate Uterine Fundus: firm Incision: negative pressure dressing in place, good seal DVT Evaluation: No evidence of DVT seen on physical exam.  Recent Labs    01/25/20 1220 01/26/20 0725  HGB 10.0* 8.5*  HCT 30.4* 26.6*    Assessment/Plan: . Postpartum Day 2, s/p C-section . Chronic HTN: BP well controlled, continue labetalol 800 mng BID and procardia 30 XL daily . DVT PPx: lovenox . COVID vaccine recommended . Lactation following . Anemia - from surgical blood loss and preexisting anemia. Fe/colace added . Doing well, continue routine postpartum care. Anticipate discharge tomorrow   LOS: 23 days   Carlyon Shadow 01/27/2020, 7:51 AM

## 2020-01-28 ENCOUNTER — Other Ambulatory Visit (HOSPITAL_COMMUNITY): Payer: Self-pay | Admitting: Obstetrics & Gynecology

## 2020-01-28 LAB — SURGICAL PATHOLOGY

## 2020-01-28 MED ORDER — NIFEDIPINE ER OSMOTIC RELEASE 30 MG PO TB24: 30.0000 mg | ORAL_TABLET | Freq: Every day | ORAL | 6 refills | Status: DC

## 2020-01-28 MED ORDER — IBUPROFEN 800 MG PO TABS: 800.0000 mg | ORAL_TABLET | Freq: Four times a day (QID) | ORAL | 0 refills | Status: DC

## 2020-01-28 MED ORDER — SERTRALINE HCL 100 MG PO TABS: 100.0000 mg | ORAL_TABLET | Freq: Every day | ORAL | 6 refills | Status: DC

## 2020-01-28 MED ORDER — OXYCODONE HCL 5 MG PO TABS
5.0000 mg | ORAL_TABLET | ORAL | 0 refills | Status: DC | PRN
Start: 2020-01-28 — End: 2020-01-28

## 2020-01-28 MED ORDER — SERTRALINE HCL 50 MG PO TABS: 100.0000 mg | ORAL_TABLET | Freq: Every day | ORAL | Status: DC

## 2020-01-28 MED FILL — oxyCODONE HCL 5 MG TABS: 5 | 2 days supply | Qty: 30 | Fill #0

## 2020-01-28 MED FILL — SERTRALINE HCL 100 MG TABS: 100 | 30 days supply | Qty: 30 | Fill #0

## 2020-01-28 MED FILL — NIFEdipine ER OSMOTIC RELEA: 30 | 30 days supply | Qty: 30 | Fill #0

## 2020-01-28 MED FILL — IBUPROFEN 800 MG TAB: 800 | 7 days supply | Qty: 30 | Fill #0

## 2020-01-28 NOTE — Discharge Instructions (Signed)
Call MD for T>100.4, heavy vaginal bleeding, severe abdominal pain, intractable nausea and/or vomiting, or respiratory distress. Call office for BP check and incision check in 1 weeks.  Pelvic rest x 6 weeks.  No driving while taking narcotics.

## 2020-01-28 NOTE — Progress Notes (Signed)
Discharge instructions given at 1320, all questions answered, pt verbalizes understanding. Pt alert and oriented x4 and ambulatory. Pt states no pain.

## 2020-01-28 NOTE — Progress Notes (Signed)
Subjective: Postpartum Day 3: Cesarean Delivery Patient reports tolerating PO, + flatus, + BM and no problems voiding.  FOB tested COVID+ yesterday; patient tested negative.  She is feeling upset that she cannot visit her babies in NICU.    Objective: Vital signs in last 24 hours: Temp:  [97.6 F (36.4 C)-98.7 F (37.1 C)] 97.9 F (36.6 C) (01/13 1224) Pulse Rate:  [82-99] 82 (01/13 1224) Resp:  [18-20] 19 (01/13 1224) BP: (129-152)/(75-102) 132/80 (01/13 1224) SpO2:  [95 %-100 %] 100 % (01/13 1224)  Physical Exam:  General: alert, cooperative and appears stated age Lochia: appropriate Uterine Fundus: firm Incision: healing well, no significant drainage, no dehiscence, wound vac removed DVT Evaluation: No evidence of DVT seen on physical exam. Negative Homan's sign. No cords or calf tenderness.  Recent Labs    01/26/20 0725  HGB 8.5*  HCT 26.6*    Assessment/Plan: Status post Cesarean section. Doing well postoperatively.  CHTN-continue labetalol 800 BID and procardia 30 XL daily Discharge home with standard precautions and return to clinic in 4-6 weeks.  Linda Hedges 01/28/2020, 12:35 PM

## 2020-01-28 NOTE — Discharge Summary (Signed)
Postpartum Discharge Summary     Patient Name: Cheryl Ayala DOB: [birth year] MRN: [id number]  Date of admission: 01/04/2020 Delivery date:   Cheryl Ayala [[id number]]  01/25/2020    Cheryl Ayala [[id number]]  01/25/2020   Delivering provider:    Modena Ayala [[id number]]  Cheryl Ayala [[id number]]  [name], [name]   Date of discharge: 01/28/2020  Admitting diagnosis: Preterm premature rupture of membranes at 48w4dMo/Di twins Intrauterine pregnancy: 276w4d   Secondary diagnosis:  Chronic hypertension Class III Obesity Additional problems: A2DM    Discharge diagnosis: Preterm Pregnancy Delivered, CHTN, GDM A2 and Anemia                                              Post partum procedures: Bilateral Tubal Ligation Augmentation: N/A Complications: None  Hospital course: Patient was admitted on 12/20 with PPROM of twin A (Mo/Di twins) at 2675w4dShe received magnesium for neuroprotection, betamethasone and latency antibiotics.  She was continued on labetalol 800 TID for chronic hypertension.  This regimen was modified during her stay to 800 BID and Procardia 30 daily given dizziness. She had NICU consult and MFM u/s.  She was on DVT prophylaxis with unfractionated heparin throughout her stay.  During her admission (2 weeks after BMZ administration) she had her 3h GTT which diagnosed GDM.  She did require Glyburide 2.5 mg qhs after refusing insulin.  Patient had chronic anemia and received iron infusion.  She was treated for UTI with cefdinir and felt improved.  She strongly desired primary C/S with BTL despite presentation and anticipated outcomes.  On HD20, NST was non-reactive.  BPP was ordered and 4/8 for twin A (2 off for breathing and fluid).  Dr. ShaDonalee Citrincommended repeat BPP 6 hours later. Repeat BPP was also 4/8 for twin A (2 off for fluid and tone) and recommendation was to deliver the following morning.  Magnesium sulfate was  initiated for NP.  The patient underwent uncomplicated C/S with BTL.  She did well postoperatively and on POD3 was meeting all goals.  She was discharged home with plans for office f/u for incision check and BP check.    Magnesium Sulfate received: Yes: Neuroprotection BMZ received: Yes Rhophylac:No MMR:No T-DaP:Given prenatally Flu: No Transfusion:No  Physical exam  Vitals:   01/28/20 0352 01/28/20 0821 01/28/20 0955 01/28/20 1224  BP: (!) 141/75 (!) 152/102 129/76 132/80  Pulse: 95 90 87 82  Resp: '20  18 19  ' Temp: 97.9 F (36.6 C)  98.7 F (37.1 C) 97.9 F (36.6 C)  TempSrc: Oral  Oral Oral  SpO2: 98%  95% 100%  Weight:      Height:       General: alert, cooperative and no distress Lochia: appropriate Uterine Fundus: firm Incision: Healing well with no significant drainage, No significant erythema, Dressing is clean, dry, and intact DVT Evaluation: No evidence of DVT seen on physical exam. Negative Homan's sign. No cords or calf tenderness. Labs: Lab Results  Component Value Date   WBC 16.6 (H) 01/26/2020   HGB 8.5 (L) 01/26/2020   HCT 26.6 (L) 01/26/2020   MCV 77.3 (L) 01/26/2020   PLT 316 01/26/2020   CMP Latest Ref Rng & Units 01/25/2020  Glucose mg/dL -  BUN 6 - 20 mg/dL -  Creatinine 0.44 -  1.00 mg/dL 0.75  Sodium 135 - 145 mmol/L -  Potassium 3.5 - 5.1 mmol/L -  Chloride 98 - 111 mmol/L -  CO2 22 - 32 mmol/L -  Calcium 8.9 - 10.3 mg/dL -  Total Protein 6.5 - 8.1 g/dL -  Total Bilirubin 0.3 - 1.2 mg/dL -  Alkaline Phos 38 - 126 U/L -  AST 15 - 41 U/L -  ALT 0 - 44 U/L -   Edinburgh Score: Edinburgh Postnatal Depression Scale Screening Tool 01/26/2020  I have been able to laugh and see the funny side of things. 1  I have looked forward with enjoyment to things. 0  I have blamed myself unnecessarily when things went wrong. 2  I have been anxious or worried for no good reason. 2  I have felt scared or panicky for no good reason. 0  Things have been  getting on top of me. 1  I have been so unhappy that I have had difficulty sleeping. 0  I have felt sad or miserable. 1  I have been so unhappy that I have been crying. 1  The thought of harming myself has occurred to me. 0  Edinburgh Postnatal Depression Scale Total 8     After visit meds:  Allergies as of 01/28/2020      Reactions   Penicillins Rash   Has patient had a PCN reaction causing immediate rash, facial/tongue/throat swelling, SOB or lightheadedness with hypotension: Yes Has patient had a PCN reaction causing severe rash involving mucus membranes or skin necrosis: No Has patient had a PCN reaction that required hospitalization: No Has patient had a PCN reaction occurring within the last 10 years: No If all of the above answers are "NO", then may proceed with Cephalosporin use.   Sulfa Antibiotics Rash      Medication List    STOP taking these medications   aspirin EC 81 MG tablet   dextroamphetamine 15 MG 24 hr capsule Commonly known as: DEXEDRINE SPANSULE   omeprazole 40 MG capsule Commonly known as: PRILOSEC     TAKE these medications   ibuprofen 800 MG tablet Commonly known as: ADVIL Take 1 tablet (800 mg total) by mouth 4 (four) times daily.   labetalol 200 MG tablet Commonly known as: NORMODYNE Take 1 tablet (200 mg total) by mouth 2 (two) times daily. What changed:   how much to take  when to take this   NIFEdipine 30 MG 24 hr tablet Commonly known as: PROCARDIA-XL/NIFEDICAL-XL Take 1 tablet (30 mg total) by mouth daily. Start taking on: January 29, 2020   oxyCODONE 5 MG immediate release tablet Commonly known as: Oxy IR/ROXICODONE Take 1-2 tablets (5-10 mg total) by mouth every 4 (four) hours as needed for moderate pain.   prenatal multivitamin Tabs tablet Take 1 tablet by mouth every evening.   sertraline 100 MG tablet Commonly known as: ZOLOFT Take 1 tablet (100 mg total) by mouth daily. What changed:   medication strength  how much  to take  when to take this        Discharge home in stable condition Infant Feeding: Breast Infant Disposition:NICU Discharge instruction: per After Visit Summary and Postpartum booklet. Activity: Advance as tolerated. Pelvic rest for 6 weeks.  Diet: routine diet Future Appointments:No future appointments. Follow up Visit: 1 week for BP check and incision check  Delivery mode:     Quinnetta, Roepke [[id number]]  C-Section, Low Transverse    Jayani, Rozman [[id number]]  C-Section,  Vacuum Assisted   Anticipated Birth Control:  BTL done [facility]   01/28/2020 Linda Hedges, DO

## 2020-01-28 NOTE — Plan of Care (Signed)
  Problem: Clinical Measurements: Goal: Ability to maintain clinical measurements within normal limits will improve Outcome: Adequate for Discharge Goal: Will remain free from infection Outcome: Adequate for Discharge Goal: Diagnostic test results will improve Outcome: Adequate for Discharge Goal: Respiratory complications will improve Outcome: Adequate for Discharge Goal: Cardiovascular complication will be avoided Outcome: Adequate for Discharge   Problem: Activity: Goal: Risk for activity intolerance will decrease Outcome: Adequate for Discharge   Problem: Pain Managment: Goal: General experience of comfort will improve Outcome: Adequate for Discharge   Problem: Safety: Goal: Ability to remain free from injury will improve Outcome: Adequate for Discharge   Problem: Skin Integrity: Goal: Risk for impaired skin integrity will decrease Outcome: Adequate for Discharge   Problem: Education: Goal: Knowledge of disease or condition will improve Outcome: Adequate for Discharge Goal: Knowledge of the prescribed therapeutic regimen will improve Outcome: Adequate for Discharge Goal: Individualized Educational Video(s) Outcome: Adequate for Discharge   Problem: Clinical Measurements: Goal: Complications related to the disease process, condition or treatment will be avoided or minimized Outcome: Adequate for Discharge   Problem: Education: Goal: Knowledge of condition will improve Outcome: Adequate for Discharge Goal: Individualized Educational Video(s) Outcome: Adequate for Discharge Goal: Individualized Newborn Educational Video(s) Outcome: Adequate for Discharge   Problem: Activity: Goal: Will verbalize the importance of balancing activity with adequate rest periods Outcome: Adequate for Discharge Goal: Ability to tolerate increased activity will improve Outcome: Adequate for Discharge   Problem: Coping: Goal: Ability to identify and utilize available resources and  services will improve Outcome: Adequate for Discharge   Problem: Life Cycle: Goal: Chance of risk for complications during the postpartum period will decrease Outcome: Adequate for Discharge   Problem: Role Relationship: Goal: Ability to demonstrate positive interaction with newborn will improve Outcome: Adequate for Discharge   Problem: Skin Integrity: Goal: Demonstration of wound healing without infection will improve Outcome: Adequate for Discharge

## 2020-02-02 ENCOUNTER — Other Ambulatory Visit: Payer: Self-pay

## 2020-02-02 ENCOUNTER — Inpatient Hospital Stay (HOSPITAL_COMMUNITY)
Admission: AD | Admit: 2020-02-02 | Discharge: 2020-02-03 | Disposition: A | Discharge status: Home / Self Care | Payer: No Typology Code available for payment source | Attending: Obstetrics and Gynecology | Admitting: Obstetrics and Gynecology

## 2020-02-02 DIAGNOSIS — T8131XA Disruption of external operation (surgical) wound, not elsewhere classified, initial encounter: Secondary | ICD-10-CM

## 2020-02-02 DIAGNOSIS — Z880 Allergy status to penicillin: Secondary | ICD-10-CM | POA: Insufficient documentation

## 2020-02-02 DIAGNOSIS — O9853 Other viral diseases complicating the puerperium: Secondary | ICD-10-CM | POA: Insufficient documentation

## 2020-02-02 DIAGNOSIS — O900 Disruption of cesarean delivery wound: Secondary | ICD-10-CM | POA: Insufficient documentation

## 2020-02-02 DIAGNOSIS — N99842 Postprocedural seroma of a genitourinary system organ or structure following a genitourinary system procedure: Secondary | ICD-10-CM

## 2020-02-02 DIAGNOSIS — Z882 Allergy status to sulfonamides status: Secondary | ICD-10-CM | POA: Insufficient documentation

## 2020-02-02 DIAGNOSIS — U071 COVID-19: Secondary | ICD-10-CM | POA: Insufficient documentation

## 2020-02-03 ENCOUNTER — Inpatient Hospital Stay (HOSPITAL_COMMUNITY): Payer: No Typology Code available for payment source

## 2020-02-03 ENCOUNTER — Encounter (HOSPITAL_COMMUNITY): Payer: Self-pay | Admitting: Obstetrics and Gynecology

## 2020-02-03 ENCOUNTER — Other Ambulatory Visit: Payer: Self-pay

## 2020-02-03 DIAGNOSIS — O900 Disruption of cesarean delivery wound: Secondary | ICD-10-CM

## 2020-02-03 DIAGNOSIS — Z882 Allergy status to sulfonamides status: Secondary | ICD-10-CM | POA: Diagnosis not present

## 2020-02-03 DIAGNOSIS — Z880 Allergy status to penicillin: Secondary | ICD-10-CM | POA: Diagnosis not present

## 2020-02-03 DIAGNOSIS — O99893 Other specified diseases and conditions complicating puerperium: Secondary | ICD-10-CM | POA: Diagnosis present

## 2020-02-03 DIAGNOSIS — O9853 Other viral diseases complicating the puerperium: Secondary | ICD-10-CM | POA: Diagnosis not present

## 2020-02-03 DIAGNOSIS — U071 COVID-19: Secondary | ICD-10-CM | POA: Diagnosis not present

## 2020-02-03 LAB — CBC WITH DIFFERENTIAL/PLATELET
Abs Immature Granulocytes: 0.05 10*3/uL (ref 0.00–0.07)
Basophils Absolute: 0.1 10*3/uL (ref 0.0–0.1)
Basophils Relative: 1 %
Eosinophils Absolute: 0.1 10*3/uL (ref 0.0–0.5)
Eosinophils Relative: 1 %
HCT: 32.4 % — ABNORMAL LOW (ref 36.0–46.0)
Hemoglobin: 9.9 g/dL — ABNORMAL LOW (ref 12.0–15.0)
Immature Granulocytes: 1 %
Lymphocytes Relative: 30 %
Lymphs Abs: 2.2 10*3/uL (ref 0.7–4.0)
MCH: 23.9 pg — ABNORMAL LOW (ref 26.0–34.0)
MCHC: 30.6 g/dL (ref 30.0–36.0)
MCV: 78.3 fL — ABNORMAL LOW (ref 80.0–100.0)
Monocytes Absolute: 0.6 10*3/uL (ref 0.1–1.0)
Monocytes Relative: 8 %
Neutro Abs: 4.3 10*3/uL (ref 1.7–7.7)
Neutrophils Relative %: 59 %
Platelets: 399 10*3/uL (ref 150–400)
RBC: 4.14 MIL/uL (ref 3.87–5.11)
RDW: 18.2 % — ABNORMAL HIGH (ref 11.5–15.5)
WBC: 7.3 10*3/uL (ref 4.0–10.5)
nRBC: 0 % (ref 0.0–0.2)

## 2020-02-03 NOTE — MAU Note (Signed)
Pt presents to MAU post-partum after delivering twins at [facility] on 01/25/2020 for bleeding and weeping from her c-section incision site beginning around 1700 today and becoming much heavier around 2300.  Pt reports she and her husband both tested positive for COVID-19 on 01/29/2020.  Pt reports mild fever, denies SOB, chest pain, cough.

## 2020-02-03 NOTE — Discharge Instructions (Signed)
COVID-19: What to Do if You Are Sick If you have a fever, cough or other symptoms, you might have COVID-19. Most people have mild illness and are able to recover at home. If you are sick:  Keep track of your symptoms.  If you have an emergency warning sign (including trouble breathing), call 911. Steps to help prevent the spread of COVID-19 if you are sick If you are sick with COVID-19 or think you might have COVID-19, follow the steps below to care for yourself and to help protect other people in your home and community. Stay home except to get medical care  Stay home. Most people with COVID-19 have mild illness and can recover at home without medical care. Do not leave your home, except to get medical care. Do not visit public areas.  Take care of yourself. Get rest and stay hydrated. Take over-the-counter medicines, such as acetaminophen, to help you feel better.  Stay in touch with your doctor. Call before you get medical care. Be sure to get care if you have trouble breathing, or have any other emergency warning signs, or if you think it is an emergency.  Avoid public transportation, ride-sharing, or taxis. Separate yourself from other people As much as possible, stay in a specific room and away from other people and pets in your home. If possible, you should use a separate bathroom. If you need to be around other people or animals in or outside of the home, wear a mask. Tell your close contactsthat they may have been exposed to COVID-19. An infected person can spread COVID-19 starting 48 hours (or 2 days) before the person has any symptoms or tests positive. By letting your close contacts know they may have been exposed to COVID-19, you are helping to protect everyone.  Additional guidance is available for those living in close quarters and shared housing.  See COVID-19 and Animals if you have questions about pets.  If you are diagnosed with COVID-19, someone from the health department  may call you. Answer the call to slow the spread. Monitor your symptoms  Symptoms of COVID-19 include fever, cough, or other symptoms.  Follow care instructions from your healthcare provider and local health department. Your local health authorities may give instructions on checking your symptoms and reporting information. When to seek emergency medical attention Look for emergency warning signs* for COVID-19. If someone is showing any of these signs, seek emergency medical care immediately:  Trouble breathing  Persistent pain or pressure in the chest  New confusion  Inability to wake or stay awake  Pale, gray, or blue-colored skin, lips, or nail beds, depending on skin tone *This list is not all possible symptoms. Please call your medical provider for any other symptoms that are severe or concerning to you. Call 911 or call ahead to your local emergency facility: Notify the operator that you are seeking care for someone who has or may have COVID-19. Call ahead before visiting your doctor  Call ahead. Many medical visits for routine care are being postponed or done by phone or telemedicine.  If you have a medical appointment that cannot be postponed, call your doctor's office, and tell them you have or may have COVID-19. This will help the office protect themselves and other patients. Get  tested  If you have symptoms of COVID-19, get tested. While waiting for test results, you stay away from others, including staying apart from those living in your household.  You can visit your state, tribal,  local, and territorialhealth department's website to look for the latest local information on testing sites. If you are sick, wear a mask over your nose and mouth  You should wear a mask over your nose and mouth if you must be around other people or animals, including pets (even at home).  You don't need to wear the mask if you are alone. If you can't put on a mask (because of trouble  breathing, for example), cover your coughs and sneezes in some other way. Try to stay at least 6 feet away from other people. This will help protect the people around you.  Masks should not be placed on young children under age 1 years, anyone who has trouble breathing, or anyone who is not able to remove the mask without help. Note: During the COVID-19 pandemic, medical grade facemasks are reserved for healthcare workers and some first responders. Cover your coughs and sneezes  Cover your mouth and nose with a tissue when you cough or sneeze.  Throw away used tissues in a lined trash can.  Immediately wash your hands with soap and water for at least 20 seconds. If soap and water are not available, clean your hands with an alcohol-based hand sanitizer that contains at least 60% alcohol. Clean your hands often  Wash your hands often with soap and water for at least 20 seconds. This is especially important after blowing your nose, coughing, or sneezing; going to the bathroom; and before eating or preparing food.  Use hand sanitizer if soap and water are not available. Use an alcohol-based hand sanitizer with at least 60% alcohol, covering all surfaces of your hands and rubbing them together until they feel dry.  Soap and water are the best option, especially if hands are visibly dirty.  Avoid touching your eyes, nose, and mouth with unwashed hands.  Handwashing Tips Avoid sharing personal household items  Do not share dishes, drinking glasses, cups, eating utensils, towels, or bedding with other people in your home.  Wash these items thoroughly after using them with soap and water or put in the dishwasher. Clean all "high-touch" surfaces everyday  Clean and disinfect high-touch surfaces in your "sick room" and bathroom; wear disposable gloves. Let someone else clean and disinfect surfaces in common areas, but you should clean your bedroom and bathroom, if possible.  If a caregiver or  other person needs to clean and disinfect a sick person's bedroom or bathroom, they should do so on an as-needed basis. The caregiver/other person should wear a mask and disposable gloves prior to cleaning. They should wait as long as possible after the person who is sick has used the bathroom before coming in to clean and use the bathroom. ? High-touch surfaces include phones, remote controls, counters, tabletops, doorknobs, bathroom fixtures, toilets, keyboards, tablets, and bedside tables.  Clean and disinfect areas that may have blood, stool, or body fluids on them.  Use household cleaners and disinfectants. Clean the area or item with soap and water or another detergent if it is dirty. Then, use a household disinfectant. ? Be sure to follow the instructions on the label to ensure safe and effective use of the product. Many products recommend keeping the surface wet for several minutes to ensure germs are killed. Many also recommend precautions such as wearing gloves and making sure you have good ventilation during use of the product. ? Use a product from [employer] List N: Disinfectants for Coronavirus (LKGMW-10). ? Complete Disinfection Guidance When you can be  around others after being sick with COVID-19 Deciding when you can be around others is different for different situations. Find out when you can safely end home isolation. For any additional questions about your care, contact your healthcare provider or state or local health department. 04/01/2019 Content source: [facility] for Immunization and Respiratory Diseases ([facility]), Division of Viral Diseases This information is not intended to replace advice given to you by your health care provider. Make sure you discuss any questions you have with your health care provider. Document Revised: 11/16/2019 Document Reviewed: 11/16/2019 Elsevier Patient Education  2021 [place].    Seroma A seroma is a collection of fluid on the body that  looks like swelling or a mass. Seromas form where tissue has been injured or cut. Seromas vary in size. Some are small and painless. Others may become large and cause pain or discomfort. Many seromas go away on their own as the fluid is naturally absorbed by the body, and some seromas need to be drained. What are the causes? Seromas form as the result of damage to tissue or the removal of tissue. This tissue damage may happen during surgery or because of an injury or trauma. When tissue is disrupted or removed, empty space is created. The body's natural defense system (immune system) causes fluid to enter the empty space and form a seroma. What are the signs or symptoms? Symptoms of this condition include:  Swelling at the site of a surgical incision or an injury.  Drainage of clear fluid at the surgery or injury site.  Discomfort or pain. How is this diagnosed? This condition is diagnosed based on:  Your symptoms.  Your medical history.  A physical exam. During the exam, your health care provider will press on the seroma. You may also have tests, including:  Blood tests.  Imaging tests, such as an ultrasound or a CT scan. How is this treated? Some seromas go away on their own. Your health care provider may monitor you to make sure the seroma does not cause any problems. If your seroma does not go away on its own, treatment may include:  Using a needle to drain the fluid from the seroma (needle aspiration).  Inserting a small, thin tube (catheter) to drain the fluid.  Applying a bandage (dressing), such as an elastic bandage or binder.  Taking antibiotic medicines, if the seroma becomes infected. In rare cases, surgery may be done to remove the seroma and repair the area. Follow these instructions at home:  If you were prescribed an antibiotic medicine, take it as told by your health care provider. Do not stop using the antibiotic even if you start to feel better.  Take  over-the-counter and prescription medicines only as told by your health care provider.  Return to your normal activities as told by your health care provider. Ask your health care provider what activities are safe for you.  Check your seroma every day for signs of infection. Check for: ? Redness or pain. ? More swelling. ? More fluid. ? Warmth. ? Pus or a bad smell.  Keep all follow-up visits as told by your health care provider. This is important.   Contact a health care provider if:  You have a fever.  You have redness or pain at the site of the seroma.  Your seroma is more swollen or is getting bigger.  You have more fluid coming from the seroma.  Your seroma feels warm to the touch.  You have  pus or a bad smell coming from the seroma. Get help right away if:  You have a fever along with severe pain, redness at the site, or chills.  You feel confused or have trouble staying awake.  You feel like your heart is beating very fast.  You feel short of breath or are breathing rapidly.  You have cool, clammy, or sweaty skin. Summary  Seromas can form as a result of injury or surgery on tissue.  Seromas can cause swelling, drainage of clear fluid, and discomfort or pain at the site of the tissue injury.  Some seromas go away on their own. Other seromas may need to be drained.  Check your seroma every day for signs of infection, such as pain, more swelling, more fluid, warmth, or pus or a bad smell. This information is not intended to replace advice given to you by your health care provider. Make sure you discuss any questions you have with your health care provider. Document Revised: 11/24/2018 Document Reviewed: 11/24/2018 Elsevier Patient Education  2021 [employer].

## 2020-02-03 NOTE — MAU Provider Note (Addendum)
Chief Complaint:  Bleeding from c-section incision site   Event Date/Time   First Provider Initiated Contact with Patient 02/03/20 0041      HPI: Cheryl Ayala is a 35 y.o. G1P0102 who presents to maternity admissions reporting leaking bloody fluid from her C/S incision since 5pm.  She underwent a Cesarean Delivery on 01/25/20.  Marland KitchenHas been doing well until diagnosed with Covid 4 days ago. Her symptoms are mild, just congestion.  But this evening started leaking.  She reports vaginal bleeding, but no urinary symptoms, h/a, dizziness, n/v, or fever/chills.    Other This is a new problem. The current episode started today. The problem occurs intermittently. Associated symptoms include abdominal pain (appropriate for incision) and congestion. Pertinent negatives include no chills, fever, headaches, nausea or vomiting. She has tried nothing for the symptoms.   RN Note: Pt presents to MAU post-partum after delivering twins at [facility] on 01/25/2020 for bleeding and weeping from her c-section incision site beginning around 1700 today and becoming much heavier around 2300.  Pt reports she and her husband both tested positive for COVID-19 on 01/29/2020.  Pt reports mild fever, denies SOB, chest pain, cough  Past Medical History: Past Medical History:  Diagnosis Date  . Anxiety   . GERD (gastroesophageal reflux disease)   . Hypertension   . Plantar fasciitis   . Venous insufficiency     Past obstetric history: OB History  Gravida Para Term Preterm AB Living  1 1   1   2   SAB IAB Ectopic Multiple Live Births        1 2    # Outcome Date GA Lbr Len/2nd Weight Sex Delivery Anes PTL Lv  1A Preterm 01/25/20 [redacted]w[redacted]d  1320 g M CS-LTranv Spinal  LIV  1B Preterm 01/25/20 [redacted]w[redacted]d  1530 g M CS-Vac Spinal  LIV    Past Surgical History: Past Surgical History:  Procedure Laterality Date  . CESAREAN SECTION MULTI-GESTATIONAL N/A 01/25/2020   Procedure: CESAREAN SECTION MULTI-GESTATIONAL;  Surgeon: Linda Hedges, DO;  Location: [facility] LD ORS;  Service: Obstetrics;  Laterality: N/A;  . CHOLECYSTECTOMY, LAPAROSCOPIC  04/09/2019  . ESOPHAGEAL MANOMETRY N/A 09/25/2017   Procedure: ESOPHAGEAL MANOMETRY (EM);  Surgeon: Mauri Pole, MD;  Location: [facility] ENDOSCOPY;  Service: Endoscopy;  Laterality: N/A;  . IVF    . PLANTAR FASCIA RELEASE Bilateral    2019 and 2020  . WISDOM TOOTH EXTRACTION  2013    Family History: Family History  Problem Relation Age of Onset  . Cancer Mother   . Hypertension Father   . Hyperlipidemia Father   . Anxiety disorder Father   . Depression Father   . Heart disease Father   . AAA (abdominal aortic aneurysm) Father   . Depression Sister   . Anxiety disorder Sister   . Heart disease Maternal Grandmother   . Alzheimer's disease Maternal Grandmother   . Stroke Maternal Grandmother   . Gout Maternal Grandfather   . Heart disease Paternal Grandfather     Social History: Social History   Tobacco Use  . Smoking status: Never Smoker  . Smokeless tobacco: Never Used  Vaping Use  . Vaping Use: Never used  Substance Use Topics  . Alcohol use: Yes    Comment: once every 6 months   . Drug use: Never    Allergies:  Allergies  Allergen Reactions  . Penicillins Rash    Has patient had a PCN reaction causing immediate rash, facial/tongue/throat swelling, SOB or lightheadedness  with hypotension: Yes Has patient had a PCN reaction causing severe rash involving mucus membranes or skin necrosis: No Has patient had a PCN reaction that required hospitalization: No Has patient had a PCN reaction occurring within the last 10 years: No If all of the above answers are "NO", then may proceed with Cephalosporin use.   . Sulfa Antibiotics Rash    Meds:  Medications Prior to Admission  Medication Sig Dispense Refill Last Dose  . ibuprofen (ADVIL) 800 MG tablet Take 1 tablet (800 mg total) by mouth 4 (four) times daily. 30 tablet 0 02/02/2020 at Unknown time  . labetalol  (NORMODYNE) 200 MG tablet Take 1 tablet (200 mg total) by mouth 2 (two) times daily. (Patient taking differently: Take 800 mg by mouth 3 (three) times daily.) 180 tablet 3 02/02/2020 at Unknown time  . sertraline (ZOLOFT) 100 MG tablet Take 1 tablet (100 mg total) by mouth daily. 30 tablet 6 02/02/2020 at Unknown time  . NIFEdipine (PROCARDIA-XL/NIFEDICAL-XL) 30 MG 24 hr tablet Take 1 tablet (30 mg total) by mouth daily. 30 tablet 6   . oxyCODONE (OXY IR/ROXICODONE) 5 MG immediate release tablet Take 1-2 tablets (5-10 mg total) by mouth every 4 (four) hours as needed for moderate pain. 30 tablet 0   . Prenatal Vit-Fe Fumarate-FA (PRENATAL MULTIVITAMIN) TABS tablet Take 1 tablet by mouth every evening.       I have reviewed patient's Past Medical Hx, Surgical Hx, Family Hx, Social Hx, medications and allergies.  ROS:  Review of Systems  Constitutional: Negative for chills and fever.  HENT: Positive for congestion.   Gastrointestinal: Positive for abdominal pain (appropriate for incision). Negative for nausea and vomiting.  Neurological: Negative for headaches.   Other systems negative     Physical Exam   Patient Vitals for the past 24 hrs:  BP Temp Temp src Pulse Resp SpO2 Height  02/03/20 0020 115/75 98.3 F (36.8 C) Oral 83 20 96 % 5\' 7"  (1.702 m)   Constitutional: Well-developed, well-nourished female in no acute distress.  Cardiovascular: normal rate Respiratory: normal effort, no distress. GI: Abd soft, non-tender.  Nondistended.  No rebound, No guarding.    Incision healing well except for a 1cm opening on left of incision.  Unable to admit swab into opening.  There is copious serosanguinous liquid coming from this area when pressure applied.  No erethema or pus seen   MS: Extremities nontender, no edema, normal ROM Neurologic: Alert and oriented x 4.   Grossly nonfocal. GU: Neg CVAT. Skin:  Warm and Dry Psych:  Affect appropriate.   Labs: Results for orders placed or  performed during the hospital encounter of 02/02/20 (from the past 24 hour(s))  CBC with Differential/Platelet     Status: Abnormal   Collection Time: 02/03/20  1:05 AM  Result Value Ref Range   WBC 7.3 4.0 - 10.5 K/uL   RBC 4.14 3.87 - 5.11 MIL/uL   Hemoglobin 9.9 (L) 12.0 - 15.0 g/dL   HCT 32.4 (L) 36.0 - 46.0 %   MCV 78.3 (L) 80.0 - 100.0 fL   MCH 23.9 (L) 26.0 - 34.0 pg   MCHC 30.6 30.0 - 36.0 g/dL   RDW 18.2 (H) 11.5 - 15.5 %   Platelets 399 150 - 400 K/uL   nRBC 0.0 0.0 - 0.2 %   Neutrophils Relative % 59 %   Neutro Abs 4.3 1.7 - 7.7 K/uL   Lymphocytes Relative 30 %   Lymphs Abs 2.2 0.7 -  4.0 K/uL   Monocytes Relative 8 %   Monocytes Absolute 0.6 0.1 - 1.0 K/uL   Eosinophils Relative 1 %   Eosinophils Absolute 0.1 0.0 - 0.5 K/uL   Basophils Relative 1 %   Basophils Absolute 0.1 0.0 - 0.1 K/uL   Immature Granulocytes 1 %   Abs Immature Granulocytes 0.05 0.00 - 0.07 K/uL    --/--/O POS (01/09 9326)  Imaging:  CT ABDOMEN PELVIS WO CONTRAST  Result Date: 02/03/2020 CLINICAL DATA:  Wound dehiscence. Copious serosanguineous fluid. Postpartum. Bleeding and weeping from C-section. EXAM: CT ABDOMEN AND PELVIS WITHOUT CONTRAST TECHNIQUE: Multidetector CT imaging of the abdomen and pelvis was performed following the standard protocol without IV contrast. COMPARISON:  July 11, 2017 FINDINGS: Lower chest: There are trace bilateral pleural effusions, right greater than left.There is cardiomegaly. The intracardiac blood pool is hypodense relative to the adjacent myocardium consistent with anemia. Hepatobiliary: The liver is normal. Status post cholecystectomy.There is no biliary ductal dilation. Pancreas: Normal contours without ductal dilatation. No peripancreatic fluid collection. Spleen: The spleen is enlarged measuring 13 cm craniocaudad. Adrenals/Urinary Tract: --Adrenal glands: Unremarkable. --Right kidney/ureter: No hydronephrosis or radiopaque kidney stones. --Left kidney/ureter: No  hydronephrosis or radiopaque kidney stones. --Urinary bladder: Unremarkable. Stomach/Bowel: --Stomach/Duodenum: No hiatal hernia or other gastric abnormality. Normal duodenal course and caliber. --Small bowel: Unremarkable. --Colon: Unremarkable. --Appendix: Normal. Vascular/Lymphatic: Normal course and caliber of the major abdominal vessels. --No retroperitoneal lymphadenopathy. --No mesenteric lymphadenopathy. --No pelvic or inguinal lymphadenopathy. Reproductive: The patient is status post prior recent Caesarean section. There is a fluid collection along the incision and anterior to the rectus muscles measuring approximately 15 x 3 cm. There are no pockets of gas. There is some overlying skin thickening. The uterus is heterogeneous in appearance likely secondary to recent delivery. Other: No ascites or free air. The abdominal wall is normal. Musculoskeletal. No acute displaced fractures. IMPRESSION: 1. There is a fluid collection along the incision and anterior to the rectus muscles measuring approximately 15 x 3 cm. There are no pockets of gas. There is some overlying skin thickening. Findings may represent a postoperative seroma or hematoma. An abscess is not entirely excluded. 2. Trace bilateral pleural effusions, right greater than left. 3. Cardiomegaly. 4. Anemia. 5. Splenomegaly. Electronically Signed   By: Constance Holster M.D.   On: 02/03/2020 02:50     MAU Course/MDM: I have ordered labs as follows:  CBC + diff. There is no leukocytosis noted. Imaging ordered: CT abd and pelvis ordered per Dr Elly Modena to rule out tracts internally.  This did show a superficial, though moderately large wound seroma.  They also noted cardiomegaly but did not elaborate on it.  They also mentioned Splenomegaly (? Related to covid).  Discussed with Dr Elly Modena, who does not recommend action on these items at this time.  Their significance is unclear. Will have her followup with her Physician.   Treatments in MAU  included Dressing applied and dressing supplies given for home use.  Discussed signs of worsening including erethema, change in drainage, fever, increased pain.   .   Pt stable at time of discharge.  Assessment: Postoperative Day #8 Wound seroma Small area of dehiscence Covid positive Day #4  Plan: Discharge home Recommend keep wound clean and dry and monitor for signs of infection or further dehiscence..   Recommend she call office in am to arrange followup.  Could do video visit if necessary to let them see her wound.    Message left on office  voicemail to arrange followup  Encouraged to return here or to other Urgent Care/ED if she develops worsening of symptoms, increase in pain, fever, or other concerning symptoms.   Hansel Feinstein CNM, MSN Certified Nurse-Midwife 02/03/2020 12:41 AM

## 2020-02-07 ENCOUNTER — Ambulatory Visit: Payer: Self-pay

## 2020-02-07 NOTE — Lactation Note (Signed)
This note was copied from a baby's chart. Lactation Consultation Note  Patient Name: Genavieve Mangiapane [id number]'T Date: 02/07/2020 Reason for consult: NICU baby;Follow-up assessment Age:36 days  LC to room for visit with fob. Mother is still in quarantine and plans to visit on Tuesday. [place] spoke with mother by phone. Per mom, she pumps 4 x day with minimal yield. LC encouraged mom to increase pumping to 8xday and use hand expression/breast compressions to increase yield. LC will f/u with mother next week for continued support.    Consult Status Consult Status: Follow-up Follow-up type: In-patient   Gwynne Edinger, MA IBCLC 02/07/2020, 4:06 PM

## 2020-02-11 ENCOUNTER — Other Ambulatory Visit (HOSPITAL_COMMUNITY): Payer: Self-pay | Admitting: Obstetrics & Gynecology

## 2020-02-11 MED FILL — AZITHROMYCIN 250 MG TABLET: 250 | 5 days supply | Qty: 6 | Fill #0

## 2020-02-12 ENCOUNTER — Ambulatory Visit: Payer: Self-pay

## 2020-02-12 NOTE — Lactation Note (Signed)
This note was copied from a baby's chart. Lactation Consultation Note  Patient Name: Dorraine Ellender [id number] Date: 02/12/2020 Reason for consult: Follow-up assessment;Primapara;1st time breastfeeding;Infant < 6lbs;Multiple gestation;NICU baby;Preterm <34wks History of infertility, IVF pregnancy, GDM Age:36 wk.o.    LC in to visit with primigravida of twins born at [redacted]w[redacted]d.  Babies are 89 weeks old AGA [redacted]w[redacted]d.  Both babies are spending time STS on Mom's chest.  Mom has concerns regarding her milk supply.  Mom admits to having a slow start with pumping due to her having Covid and being very sick with it.  Mom states she started pumping more regularly, but only 3-4 times a day on the 3rd day post partum.  Mom denies any breast changes with pregnancy, and conceived with IVF.    Mom wanting information regarding how to increase her milk supply.  Mom using a hand's free pumping bra and doing breast massage during pumping.  Mom encouraged to increase frequency of pumping to 8 times pre 24 hrs, including one pumping between 2-5 am due to elevated prolactin hormone.  Instructed Mom on power pumping once a day.  Warm packs given and Mom to place in her bra while pumping. Mom aware of benefit of having babies STS, and then pumping both breasts on maintenance setting for 20 mins.  Washing and drying bins set up on counter, along with soap and bottle brush.  Mom had pump parts in the sink.  LC washed, rinsed and set disassembled parts on paper towels to dry.      Interventions Interventions: Breast feeding basics reviewed;Skin to skin;Breast massage;Hand express;DEBP  Consult Status Consult Status: Follow-up Date: 02/18/20 Follow-up type: In-patient    Broadus John 02/12/2020, 2:33 PM

## 2020-02-17 ENCOUNTER — Ambulatory Visit: Payer: Self-pay

## 2020-02-17 NOTE — Lactation Note (Signed)
This note was copied from a baby's chart. Lactation Consultation Note  Patient Name: Cheryl Ayala [id number] Date: 02/17/2020 Reason for consult: NICU baby;Follow-up assessment Age:36 wk.o.  Mother continues to pump q 3 hours. She uses breast compressions and has sufficient vacuum pressure from pump. Her milk supply remains low at about 72mLs per pumping. Mom has hx of infertility and GDM that may effect her overall ability to produce milk. Mom has questions regarding herbal/Rx supplements to increase milk supply. LC to speak with provider regarding safety while infants are in-patient.   Consult Status Consult Status: Follow-up Follow-up type: [place], [place] IBCLC 02/17/2020, 1:48 PM

## 2020-02-19 ENCOUNTER — Other Ambulatory Visit (HOSPITAL_COMMUNITY): Payer: Self-pay | Admitting: Obstetrics and Gynecology

## 2020-02-19 MED FILL — METOCLOPRAMIDE 10 MG TABLET: 10 | 10 days supply | Qty: 30 | Fill #0

## 2020-02-26 ENCOUNTER — Other Ambulatory Visit (HOSPITAL_COMMUNITY): Payer: Self-pay | Admitting: Obstetrics and Gynecology

## 2020-02-26 MED FILL — CLINDAMYCIN HCL 300 MG CAP: 300 | 5 days supply | Qty: 20 | Fill #0

## 2020-02-27 MED FILL — NIFEdipine ER OSMOTIC RELEA: 30 | 30 days supply | Qty: 30 | Fill #1

## 2020-03-02 ENCOUNTER — Ambulatory Visit: Payer: Self-pay

## 2020-03-02 NOTE — Lactation Note (Signed)
This note was copied from a baby's chart. Lactation Consultation Note  Patient Name: Cheryl Ayala [id number] Date: 03/02/2020 Reason for consult: Follow-up assessment;Mother's request;Primapara;1st time breastfeeding;NICU baby;Preterm <34wks;Multiple gestation Age:37 wk.o.  2505 - 1810 - [name] paged lactation to assist with placing baby Gaspar Bidding (B) to the breast to nuzzle and lick. She pre-pumped prior to this visi.  She reports that she is now pumping one ounce/combined per session and is extremely pleased that her babies are receiving her EBM. She is on day 9 of a course of Reglan prescribed by her physician. She states that she heard about the medication via an Therapist, sports. She states that it has helped her milk volume increase substantially. Her last day of the 10 day course is tomorrow (2/17).  We practiced positioning and allowing baby to nuzzle and root at the breast. We discussed possibility of using a nipple shield as a teaching aid in future sessions and Gaspar Bidding becomes ready. He maintained an alert stated and was actively rooting for the duration of this visit.  [name] requested lactation follow up on 2/18 at 1800. She is back to work full time and tries to be at the NICU for the 1800 feeding.  All questions answered at this time.   Maternal Data Has patient been taught Hand Expression?: Yes Does the patient have breastfeeding experience prior to this delivery?: No   Interventions Interventions: Education  Discharge Pump: DEBP  Consult Status Consult Status: Follow-up Date: 03/04/20 Follow-up type: In-patient    [name] 03/02/2020, 6:46 PM

## 2020-03-07 ENCOUNTER — Ambulatory Visit: Payer: Self-pay

## 2020-03-07 NOTE — Lactation Note (Signed)
This note was copied from a baby's chart. Lactation Consultation Note  Patient Name: Cheryl Ayala [id number]'[id number] Date: 03/07/2020   Age:36 wk.o.   Phone call to Mom scheduled breastfeeding consultation.  Mom at work and desires appt after 5pm.   appt made for 2/25 at 6 pm  Mom has been pumping every 3 hrs at day expressing 30 ml and first pumping in the morning 60 ml.   Encouraged STS with babies and consistent pumping.   Broadus John 03/07/2020, 11:51 AM

## 2020-03-09 ENCOUNTER — Other Ambulatory Visit (HOSPITAL_COMMUNITY): Payer: Self-pay | Admitting: Obstetrics & Gynecology

## 2020-03-09 MED FILL — OMEPRAZOLE 40 MG CPDR: 40 | 30 days supply | Qty: 60 | Fill #0

## 2020-03-11 ENCOUNTER — Ambulatory Visit: Payer: Self-pay

## 2020-03-11 NOTE — Lactation Note (Signed)
This note was copied from a baby's chart. Lactation Consultation Note  Patient Name: Garnell Begeman [id number] Date: 03/11/2020 Reason for consult: Follow-up assessment;Mother's request;Preterm <34wks;Other (Comment) (36 1/7 PMA )) Age:36 wk.o.  Consult at 6 pm.  Baby B respirations per RN were ok to latch.  Mom wanted to try the right breast. LC showed mom the cross cradle , baby  Only sustained the latched for few sucks. Tried a #24 NS on the right , would not stay on, even though the areola compressed enough to fit it.  Mom decided to switch to the left breast / football/ and mom had more control over being independent with latch. The Latch was on and off , added the #20 NS on the left and baby fed for 15 mins with small amount of milk in the NS afterwards. Mom seemed very pleased the baby sustained a latch for 15 mins.  Mom planned to pump after finishing breast feeding.   During consult - mom mentioned she is pumping 6 times a day with the most volume in the am 60 ml and then just 15 ml off each breast during the other pumpings. Also has not been power pumping any more.  LC recommended adding a power pumping back at a relaxing time of the day, idea was in the evening before bed time.   Maternal Data Has patient been taught Hand Expression?: Yes (reviewed)  Feeding Mother's Current Feeding Choice: Breast Milk and Formula  LATCH Score Latch: Repeated attempts needed to sustain latch, nipple held in mouth throughout feeding, stimulation needed to elicit sucking reflex.  Audible Swallowing: Spontaneous and intermittent (small amount of milk in the NS after baby was released)  Type of Nipple: Everted at rest and after stimulation  Comfort (Breast/Nipple): Soft / non-tender  Hold (Positioning): Assistance needed to correctly position infant at breast and maintain latch.  LATCH Score: 8   Lactation Tools Discussed/Used Tools: Pump;Nipple Shields Nipple shield size: 20;24;Other  (comment) (#20 NS fit the best for the baby -) Breast pump type: Double-Electric Breast Pump Reason for Pumping: twins - NICU Pumping frequency: every 3 hours per mom Pumped volume: 210 mL  Interventions Interventions: Breast feeding basics reviewed;Assisted with latch;Breast massage;Breast compression;Adjust position;Support pillows;Position options;DEBP;Education  Discharge    Consult Status Consult Status: PRN Date:  (mom back to work and its according to her schedule) Follow-up type: In-patient    [place] 03/11/2020, 6:54 PM

## 2020-03-12 ENCOUNTER — Ambulatory Visit: Payer: Self-pay

## 2020-03-12 NOTE — Lactation Note (Signed)
This note was copied from a baby's chart. Lactation Consultation Note  Patient Name: Cheryl Ayala [id number] Date: 03/12/2020   Age:36 wk.o.   Mother and FOB on their way out when [facility] and [facility] student went to provide consult. Mother stated that she requested and received assistance yesterday from lactation. Briefly discussed her milk supply since returning to work. She asked about receiving second pump though secondary insurance. Was advised to call insurance company. Wishes to have lactation come back tomorrow, 2/27 around 3 pm or Monday, 2/28 around 6 pm for lactation assistance.    Lajuana Ripple 03/12/2020, 5:54 PM

## 2020-03-14 ENCOUNTER — Ambulatory Visit: Payer: Self-pay

## 2020-03-14 NOTE — Lactation Note (Signed)
This note was copied from a baby's chart. Lactation Consultation Note  Patient Name: Cheryl Ayala FUXNA'T Date: 03/14/2020 Reason for consult: Follow-up assessment;Preterm <34wks;NICU baby;Other (Comment);Mother's request (PMA - 36-4/7) Age:36 wk.o.  LC visited 15 mins prior to the 6 pm  N/G feeding to latch with #20 NS.  Mom able to apply the NS well and LC assisted with pillow support on the right breast and checked the lip lines . Lips flanged and per mom comfortable.  Baby coordinating sucking on the NS better than a few days ago , and increased  Swallows. Baby fed for 6 mins and released on his own with a quick let down.  Mom preferred not to re-latch, and called the NICU RN to come and start the tube feeding.  [place] asked mom about her pumping - 6 times a day and no change in volume.  Has not added the power pump daily as of yet.  LC praised mom for how well she latched the baby. Latch score of 9 .   Maternal Data    Feeding Mother's Current Feeding Choice: Breast Milk and Formula  LATCH Score Latch: Grasps breast easily, tongue down, lips flanged, rhythmical sucking.  Audible Swallowing: Spontaneous and intermittent  Type of Nipple: Everted at rest and after stimulation  Comfort (Breast/Nipple): Soft / non-tender  Hold (Positioning): Assistance needed to correctly position infant at breast and maintain latch.  LATCH Score: 9   Lactation Tools Discussed/Used Tools: Pump;Nipple Shields Nipple shield size: 20 Flange Size: 24;27 Breast pump type: Double-Electric Breast Pump  Interventions Interventions: Breast feeding basics reviewed;Adjust position;Support pillows;Position options;DEBP  Discharge    Consult Status Consult Status: PRN Date:  (mom back to work , will request) Follow-up type: In-patient    [place] 03/14/2020, 5:59 PM

## 2020-03-17 ENCOUNTER — Ambulatory Visit: Payer: Self-pay

## 2020-03-17 NOTE — Lactation Note (Signed)
This note was copied from a baby's chart. Lactation Consultation Note  Patient Name: Cheryl Ayala [id number] Date: 03/17/2020   Age:36 wk.o.   LC in to speak with Mom.  Mom came in for her lunch break from work to power-pump.  Mom will be in after work and desire lactation assistance at 6 pm feeding with baby boy B "Bryan".    Broadus John 03/17/2020, 2:55 PM

## 2020-03-17 NOTE — Lactation Note (Signed)
This note was copied from a baby's chart. Lactation Consultation Note  Patient Name: Cheryl Ayala'O Date: 03/17/2020 Reason for consult: Follow-up assessment;NICU baby;Mother's request Age:36 wk.o.  Follow up to 36 weeks old premature twin currently in NICU, per mother's request. Mother is a primipara, first-time breastfeeding.   Mother requests assistance with latch to right breast using a NS 23mm. Attempted latch cradle position, unable to latch. Used support pillows for football position to right breast. Latched infant after a few attempts. Noted intermittent suckling and swallowing. Good vital signs throughout feeding. Infant able to stay at breast for ~64minutes.  Reviewed neck extension and back support. Noted traces of BM inside shield. Talked about power pumping, pumping at night and using maintenance setting.  Mother requests assistance with feeding 3/4 @ 6p.    Maternal Data Has patient been taught Hand Expression?: Yes  Feeding Mother's Current Feeding Choice: Breast Milk and Formula  LATCH Score Latch: Grasps breast easily, tongue down, lips flanged, rhythmical sucking.  Audible Swallowing: Spontaneous and intermittent  Type of Nipple: Everted at rest and after stimulation  Comfort (Breast/Nipple): Soft / non-tender  Hold (Positioning): Assistance needed to correctly position infant at breast and maintain latch.  LATCH Score: 9   Lactation Tools Discussed/Used Tools: Pump;Flanges;Nipple Shields Nipple shield size: 20 Flange Size: 24;27 Breast pump type: Double-Electric Breast Pump Reason for Pumping: NICU admission, maternal infant separation Pumping frequency: 8-12 times in 24h  Interventions Interventions: Breast feeding basics reviewed;Assisted with latch;Skin to skin;Adjust position;Position options;Support pillows;Expressed milk;Education  Discharge Pump: DEBP  Consult Status Consult Status: Follow-up Date: 03/18/20 Follow-up type:  In-patient    Jametta Moorehead A Higuera Ancidey 03/17/2020, 6:47 PM

## 2020-03-18 ENCOUNTER — Ambulatory Visit: Payer: Self-pay

## 2020-03-18 NOTE — Lactation Note (Signed)
This note was copied from a baby's chart. Lactation Consultation Note  Patient Name: Zenya Hickam EEFEO'F Date: 03/18/2020 Reason for consult: Follow-up assessment;Mother's request Age:36 wk.o.    Follow up to 46 weeks old premature twin currently in NICU, per mother's request. Mother is a primipara, first-time breastfeeding.   LC in to assist with latch. Mother prefers right breast, football position using a NS 52mm. Infant latched and took some time to settle at breast before starting to suckle. Noted intermittent suckling and swallowing. Good vital signs throughout feeding. Infant able to stay at breast for ~81minutes.  Noted traces of BM inside shield, good alignment and position.  Mother requests assistance with feeding 3/7 @ 6p.   Feeding Mother's Current Feeding Choice: Breast Milk  LATCH Score Latch: Grasps breast easily, tongue down, lips flanged, rhythmical sucking.  Audible Swallowing: Spontaneous and intermittent  Type of Nipple: Everted at rest and after stimulation  Comfort (Breast/Nipple): Soft / non-tender  Hold (Positioning): Assistance needed to correctly position infant at breast and maintain latch.  LATCH Score: 9   Lactation Tools Discussed/Used Tools: Pump Breast pump type: Double-Electric Breast Pump Reason for Pumping: maternal infant separation Pumping frequency: 8-12 times in 24h Pumped volume: 30 mL  Interventions Interventions: Assisted with latch;DEBP;Expressed milk;Breast massage  Discharge    Consult Status Consult Status: Follow-up Date: 03/19/20 Follow-up type: [place] 03/18/2020, 6:33 PM

## 2020-03-21 ENCOUNTER — Ambulatory Visit: Payer: Self-pay

## 2020-03-21 NOTE — Lactation Note (Signed)
This note was copied from a baby's chart. Lactation Consultation Note  Patient Name: Cheryl Ayala HUDJS'H Date: 03/21/2020 Reason for consult: Follow-up assessment;Mother's request;Primapara;1st time breastfeeding;Multiple gestation;NICU baby;Early term 37-38.6wks Age:36 wk.o.  LC went to room for scheduled appointment to assist/assess with breastfeeding and Mom stated that she was choosing to bottle feed babies tonight.  Mom states she is pumping 6 times a day for 30 ml each pumping.  Mom denies any questions currently and will ask for [facility] as needed.   Lactation Tools Discussed/Used Tools: Pump;Bottle Breast pump type: Double-Electric Breast Pump  Interventions Interventions: DEBP;Skin to skin;Breast massage;Hand express   Consult Status Consult Status: Follow-up Date: 03/28/20 Follow-up type: In-patient    Broadus John 03/21/2020, 6:21 PM

## 2020-04-02 ENCOUNTER — Ambulatory Visit: Payer: Self-pay

## 2020-04-02 NOTE — Lactation Note (Signed)
This note was copied from a baby's chart. Lactation Consultation Note  Patient Name: Cheryl Ayala SFKCL'E Date: 04/02/2020 Reason for consult: Follow-up assessment;NICU baby Age:36 m.o.   Follow upto 2 monthsold premature twins currently in NICU. Mother explains she is bottlefeeding exclusively. Mother continues to pump 6x per day and collecting ~41mL combined per session.  Urged mother to contact [facility] services as needed for support/questions/concerns.  Maternal Data Has patient been taught Hand Expression?: Yes Does the patient have breastfeeding experience prior to this delivery?: No  Feeding Mother's Current Feeding Choice: Breast Milk and Formula  Interventions Interventions: Education;DEBP;Expressed milk;Skin to skin  Consult Status Consult Status: Follow-up Date: 04/03/20 Follow-up type: In-patient    Daveion Robar A Higuera Ancidey 04/02/2020, 6:12 PM

## 2020-04-07 ENCOUNTER — Inpatient Hospital Stay (HOSPITAL_COMMUNITY): Admit: 2020-04-07 | Payer: Self-pay

## 2020-04-30 ENCOUNTER — Other Ambulatory Visit (HOSPITAL_COMMUNITY): Payer: Self-pay

## 2020-04-30 MED FILL — Nifedipine Tab ER 24HR Osmotic Release 30 MG: ORAL | 30 days supply | Qty: 30 | Fill #0 | Status: AC

## 2020-04-30 MED FILL — Labetalol HCl Tab 200 MG: ORAL | 30 days supply | Qty: 90 | Fill #0 | Status: AC

## 2020-05-30 ENCOUNTER — Other Ambulatory Visit (HOSPITAL_COMMUNITY): Payer: Self-pay

## 2020-05-30 MED FILL — Nifedipine Tab ER 24HR Osmotic Release 30 MG: ORAL | 30 days supply | Qty: 30 | Fill #1 | Status: AC

## 2020-06-28 ENCOUNTER — Other Ambulatory Visit (HOSPITAL_COMMUNITY): Payer: Self-pay

## 2020-06-28 MED FILL — Nifedipine Tab ER 24HR Osmotic Release 30 MG: ORAL | 30 days supply | Qty: 30 | Fill #2 | Status: AC

## 2020-06-28 MED FILL — Omeprazole Cap Delayed Release 40 MG: ORAL | 30 days supply | Qty: 60 | Fill #0 | Status: AC

## 2020-07-06 ENCOUNTER — Telehealth: Payer: Self-pay | Admitting: Nurse Practitioner

## 2020-07-06 NOTE — Telephone Encounter (Signed)
Tried to call based on message we received: Appointment Request From: [name]    With Provider: Wilfred Lacy, NP [[facility]]    Preferred Date Range: Any    Preferred Times: Any Time    Reason for visit: Annual Physical    Comments:  updated labs   Lvm for pt to call back

## 2020-07-08 ENCOUNTER — Ambulatory Visit (INDEPENDENT_AMBULATORY_CARE_PROVIDER_SITE_OTHER): Payer: No Typology Code available for payment source | Admitting: Nurse Practitioner

## 2020-07-08 ENCOUNTER — Other Ambulatory Visit (HOSPITAL_COMMUNITY): Payer: Self-pay

## 2020-07-08 ENCOUNTER — Other Ambulatory Visit: Payer: Self-pay

## 2020-07-08 ENCOUNTER — Encounter: Payer: Self-pay | Admitting: Nurse Practitioner

## 2020-07-08 VITALS — BP 130/90 | HR 91 | Temp 97.9°F | Ht 67.0 in | Wt 323.0 lb

## 2020-07-08 DIAGNOSIS — Z8632 Personal history of gestational diabetes: Secondary | ICD-10-CM

## 2020-07-08 DIAGNOSIS — Z6841 Body Mass Index (BMI) 40.0 and over, adult: Secondary | ICD-10-CM

## 2020-07-08 DIAGNOSIS — Z79899 Other long term (current) drug therapy: Secondary | ICD-10-CM

## 2020-07-08 DIAGNOSIS — F411 Generalized anxiety disorder: Secondary | ICD-10-CM

## 2020-07-08 DIAGNOSIS — I10 Essential (primary) hypertension: Secondary | ICD-10-CM

## 2020-07-08 DIAGNOSIS — D500 Iron deficiency anemia secondary to blood loss (chronic): Secondary | ICD-10-CM

## 2020-07-08 DIAGNOSIS — G4719 Other hypersomnia: Secondary | ICD-10-CM

## 2020-07-08 DIAGNOSIS — E782 Mixed hyperlipidemia: Secondary | ICD-10-CM | POA: Diagnosis not present

## 2020-07-08 DIAGNOSIS — F909 Attention-deficit hyperactivity disorder, unspecified type: Secondary | ICD-10-CM

## 2020-07-08 DIAGNOSIS — F3341 Major depressive disorder, recurrent, in partial remission: Secondary | ICD-10-CM

## 2020-07-08 DIAGNOSIS — R739 Hyperglycemia, unspecified: Secondary | ICD-10-CM | POA: Diagnosis not present

## 2020-07-08 MED ORDER — FREESTYLE LITE W/DEVICE KIT
PACK | 0 refills | Status: DC
  Filled 2020-07-08 – 2020-07-20 (×2): qty 1, 30d supply, fill #0

## 2020-07-08 MED ORDER — BLOOD GLUCOSE METER KIT: PACK | 0 refills | Status: DC

## 2020-07-08 MED ORDER — DEXTROAMPHETAMINE SULFATE ER 15 MG PO CP24
15.0000 mg | ORAL_CAPSULE | ORAL | 0 refills | Status: DC
  Filled 2020-07-08: qty 30, 30d supply, fill #0

## 2020-07-08 MED ORDER — GLUCOSE BLOOD VI STRP
ORAL_STRIP | 0 refills | Status: DC
  Filled 2020-07-08 – 2020-07-20 (×2): qty 100, 90d supply, fill #0

## 2020-07-08 MED ORDER — FREESTYLE LANCETS MISC
0 refills | Status: DC
  Filled 2020-07-08 – 2020-07-20 (×2): qty 100, 90d supply, fill #0

## 2020-07-08 MED ORDER — AMLODIPINE BESYLATE 10 MG PO TABS
10.0000 mg | ORAL_TABLET | Freq: Every day | ORAL | 3 refills | Status: DC
  Filled 2020-07-08: qty 90, 90d supply, fill #0
  Filled 2020-10-26: qty 90, 90d supply, fill #1
  Filled 2021-02-03: qty 90, 90d supply, fill #2

## 2020-07-08 NOTE — Assessment & Plan Note (Signed)
>>  ASSESSMENT AND PLAN FOR BENIGN ESSENTIAL HYPERTENSION WRITTEN ON 07/08/2020  2:46 PM BY Storie Heffern LUM, NP  Wants to change procardia to amlodipine. She is no longer breast feeding and s/p tubal ligation. Chronic LE edema and admits to high sodium diet and no exercise BP Readings from Last 3 Encounters:  07/08/20 130/90  02/03/20 110/73  01/28/20 132/80   Medication change made Advised about need for DASH diet and daily exercise. Check CMP

## 2020-07-08 NOTE — Assessment & Plan Note (Signed)
Gestational DM during pregnancy with twins. Does not check glucose at home at this time reports nocturia. LMP 06/16/2020, s/p tubal ligation, no longer breast feeding. She agrees to start ozempic ijection if hgbA1c>5.8% Check hgbA1c Glucometer prescription provided

## 2020-07-08 NOTE — Assessment & Plan Note (Signed)
>>  ASSESSMENT AND PLAN FOR DEPRESSION WRITTEN ON 07/08/2020  2:50 PM BY Braelynn Benning LUM, NP  Stable mood with zoloft 50mg

## 2020-07-08 NOTE — Assessment & Plan Note (Signed)
Wants to change procardia to amlodipine. She is no longer breast feeding and s/p tubal ligation. Chronic LE edema and admits to high sodium diet and no exercise BP Readings from Last 3 Encounters:  07/08/20 130/90  02/03/20 110/73  01/28/20 132/80   Medication change made Advised about need for DASH diet and daily exercise. Check CMP

## 2020-07-08 NOTE — Progress Notes (Signed)
Subjective:  Patient ID: Cheryl Ayala, female    DOB: [birth year]  Age: 35 y.o. MRN: [id number]  CC: Follow-up (Pt is fasting for lab work.  Pt would like to discuss diabetes and her Bp medication.)  HPI  Benign essential hypertension Wants to change procardia to amlodipine. She is no longer breast feeding and s/p tubal ligation. Chronic LE edema and admits to high sodium diet and no exercise BP Readings from Last 3 Encounters:  07/08/20 130/90  02/03/20 110/73  01/28/20 132/80   Medication change made Advised about need for DASH diet and daily exercise. Check CMP  Mixed hyperlipidemia Repeat lipid panel  Hyperglycemia Gestational DM during pregnancy with twins. Does not check glucose at home at this time reports nocturia. LMP 06/16/2020, s/p tubal ligation, no longer breast feeding. She agrees to start ozempic ijection if hgbA1c>5.8% Check hgbA1c Glucometer prescription provided  Depression Stable mood with zoloft 33m   Reviewed past Medical, Social and Family history today.  Outpatient Medications Prior to Visit  Medication Sig Dispense Refill   ibuprofen (ADVIL) 800 MG tablet TAKE 1 TABLET BY MOUTH 4 TIMES DAILY 30 tablet 0   labetalol (NORMODYNE) 200 MG tablet TAKE 1 TABLET BY MOUTH 3 TIMES DAILY 90 tablet 2   omeprazole (PRILOSEC) 40 MG capsule TAKE 1 CAPSULE BY MOUTH 2 TIMES DAILY 60 capsule 2   sertraline (ZOLOFT) 100 MG tablet TAKE 1 TABLET BY MOUTH ONCE A DAY (Patient taking differently: Take by mouth daily. Pt taking 1/2 pill at bedtime.) 30 tablet 6   dextroamphetamine (DEXEDRINE SPANSULE) 15 MG 24 hr capsule Take 15 mg by mouth daily.     NIFEdipine (PROCARDIA-XL/NIFEDICAL-XL) 30 MG 24 hr tablet TAKE 1 TABLET BY MOUTH ONCE A DAY 30 tablet 6   azithromycin (ZITHROMAX) 250 MG tablet TAKE 2 TABLETS BY MOUTH ON DAY 1 THEN TAKE 1 TABLET DAILY FOR THE NEXT 4 DAYS 6 tablet 0   clindamycin (CLEOCIN) 300 MG capsule TAKE 1 CAPSULE BY MOUTH EVERY 6 HOURS FOR 5 DAYS  20 capsule 0   labetalol (NORMODYNE) 200 MG tablet Take 1 tablet (200 mg total) by mouth 2 (two) times daily. 180 tablet 3   metoCLOPramide (REGLAN) 10 MG tablet TAKE 1 TABLET BY MOUTH 3 TIMES DAILY FOR 10 DAYS 30 tablet 0   oxyCODONE (OXY IR/ROXICODONE) 5 MG immediate release tablet TAKE 1 TO 2 TABLETS BY MOUTH EVERY 4 HOURS AS NEEDED FOR MODERATE PAIN 30 tablet 0   Prenatal Vit-Fe Fumarate-FA (PRENATAL MULTIVITAMIN) TABS tablet Take 1 tablet by mouth every evening.     No facility-administered medications prior to visit.    ROS See HPI  Objective:  BP 130/90 (BP Location: Left Arm, Patient Position: Sitting, Cuff Size: Large)   Pulse 91   Temp 97.9 F (36.6 C) (Temporal)   Ht '5\' 7"'  (1.702 m)   Wt (!) 323 lb (146.5 kg)   LMP 06/16/2020   SpO2 97%   BMI 50.59 kg/m   Physical Exam Constitutional:      Appearance: She is obese.  Cardiovascular:     Rate and Rhythm: Normal rate and regular rhythm.     Pulses: Normal pulses.     Heart sounds: Normal heart sounds.  Pulmonary:     Effort: Pulmonary effort is normal.     Breath sounds: Normal breath sounds.  Musculoskeletal:     Right lower leg: Edema present.     Left lower leg: Edema present.  Skin:  Findings: No erythema or rash.  Neurological:     Mental Status: She is alert and oriented to person, place, and time.  Psychiatric:        Mood and Affect: Mood normal.        Behavior: Behavior normal.        Thought Content: Thought content normal.   Assessment & Plan:  This visit occurred during the SARS-CoV-2 public health emergency.  Safety protocols were in place, including screening questions prior to the visit, additional usage of staff PPE, and extensive cleaning of exam room while observing appropriate contact time as indicated for disinfecting solutions.   Cheryl Ayala was seen today for follow-up.  Diagnoses and all orders for this visit:  Benign essential hypertension -     Comprehensive metabolic panel;  Future -     amLODipine (NORVASC) 10 MG tablet; Take 1 tablet (10 mg total) by mouth daily.  Mixed hyperlipidemia -     Comprehensive metabolic panel; Future -     Lipid panel; Future  Hyperglycemia -     Comprehensive metabolic panel; Future -     Hemoglobin A1c; Future -     blood glucose meter kit and supplies; Dispense based on patient and insurance preference. Use once a day before breakfast. ICD10:E11.65  Generalized anxiety disorder  Controlled substance agreement signed -     DRUG MONITORING, PANEL 8 WITH CONFIRMATION, URINE; Future -     dextroamphetamine (DEXEDRINE SPANSULE) 15 MG 24 hr capsule; Take 1 capsule (15 mg total) by mouth every morning.  Adult ADHD -     DRUG MONITORING, PANEL 8 WITH CONFIRMATION, URINE; Future -     dextroamphetamine (DEXEDRINE SPANSULE) 15 MG 24 hr capsule; Take 1 capsule (15 mg total) by mouth every morning.  Excessive daytime sleepiness -     dextroamphetamine (DEXEDRINE SPANSULE) 15 MG 24 hr capsule; Take 1 capsule (15 mg total) by mouth every morning.  Iron deficiency anemia due to chronic blood loss -     CBC with Differential/Platelet; Future  Hx of gestational diabetes mellitus, not currently pregnant -     Hemoglobin A1c; Future -     blood glucose meter kit and supplies; Dispense based on patient and insurance preference. Use once a day before breakfast. ICD10:E11.65  Class 3 severe obesity due to excess calories with serious comorbidity and body mass index (BMI) of 50.0 to 59.9 in adult [facility]) -     Comprehensive metabolic panel; Future -     Hemoglobin A1c; Future -     TSH; Future  Recurrent major depressive disorder, in partial remission ([place])   Problem List Items Addressed This Visit       Cardiovascular and Mediastinum   Benign essential hypertension - Primary    Wants to change procardia to amlodipine. She is no longer breast feeding and s/p tubal ligation. Chronic LE edema and admits to high sodium diet and no  exercise BP Readings from Last 3 Encounters:  07/08/20 130/90  02/03/20 110/73  01/28/20 132/80   Medication change made Advised about need for DASH diet and daily exercise. Check CMP       Relevant Medications   amLODipine (NORVASC) 10 MG tablet   Other Relevant Orders   Comprehensive metabolic panel     Other   Adult ADHD   Relevant Medications   dextroamphetamine (DEXEDRINE SPANSULE) 15 MG 24 hr capsule   Other Relevant Orders   DRUG MONITORING, PANEL 8 WITH CONFIRMATION, URINE  Controlled substance agreement signed   Relevant Medications   dextroamphetamine (DEXEDRINE SPANSULE) 15 MG 24 hr capsule   Other Relevant Orders   DRUG MONITORING, PANEL 8 WITH CONFIRMATION, URINE   Depression    Stable mood with zoloft 20m       Excessive daytime sleepiness   Relevant Medications   dextroamphetamine (DEXEDRINE SPANSULE) 15 MG 24 hr capsule   Generalized anxiety disorder   Hyperglycemia    Gestational DM during pregnancy with twins. Does not check glucose at home at this time reports nocturia. LMP 06/16/2020, s/p tubal ligation, no longer breast feeding. She agrees to start ozempic ijection if hgbA1c>5.8% Check hgbA1c Glucometer prescription provided       Relevant Medications   blood glucose meter kit and supplies   Other Relevant Orders   Comprehensive metabolic panel   Hemoglobin A1c   Mixed hyperlipidemia    Repeat lipid panel       Relevant Medications   amLODipine (NORVASC) 10 MG tablet   Other Relevant Orders   Comprehensive metabolic panel   Lipid panel   Obesity   Relevant Medications   dextroamphetamine (DEXEDRINE SPANSULE) 15 MG 24 hr capsule   Other Relevant Orders   Comprehensive metabolic panel   Hemoglobin A1c   TSH   Other Visit Diagnoses     Iron deficiency anemia due to chronic blood loss       Relevant Orders   CBC with Differential/Platelet   Hx of gestational diabetes mellitus, not currently pregnant       Relevant  Medications   blood glucose meter kit and supplies   Other Relevant Orders   Hemoglobin A1c       Follow-up: Return in about 4 weeks (around 08/05/2020) for Hyperglycemia, HTN and weight management.(365ms).  ChWilfred LacyNP

## 2020-07-08 NOTE — Patient Instructions (Addendum)
Go to [address] for blood draw and urine drug screen. Stop procardia and start amlodipine. Check glucose 3x/week before breakfast. Maintain DASH diet Continue use of compression stock Start daily exercise (walking 15-45mins a day)  Start ozempic injection if hgbA1c >5.8.  DASH Eating Plan DASH stands for Dietary Approaches to Stop Hypertension. The DASH eating plan is a healthy eating plan that has been shown to: Reduce high blood pressure (hypertension). Reduce your risk for type 2 diabetes, heart disease, and stroke. Help with weight loss. What are tips for following this plan? Reading food labels Check food labels for the amount of salt (sodium) per serving. Choose foods with less than 5 percent of the Daily Value of sodium. Generally, foods with less than 300 milligrams (mg) of sodium per serving fit into this eating plan. To find whole grains, look for the word "whole" as the first word in the ingredient list. Shopping Buy products labeled as "low-sodium" or "no salt added." Buy fresh foods. Avoid canned foods and pre-made or frozen meals. Cooking Avoid adding salt when cooking. Use salt-free seasonings or herbs instead of table salt or sea salt. Check with your health care provider or pharmacist before using salt substitutes. Do not fry foods. Cook foods using healthy methods such as baking, boiling, grilling, roasting, and broiling instead. Cook with heart-healthy oils, such as olive, canola, avocado, soybean, or sunflower oil. Meal planning  Eat a balanced diet that includes: 4 or more servings of fruits and 4 or more servings of vegetables each day. Try to fill one-half of your plate with fruits and vegetables. 6-8 servings of whole grains each day. Less than 6 oz (170 g) of lean meat, poultry, or fish each day. A 3-oz (85-g) serving of meat is about the same size as a deck of cards. One egg equals 1 oz (28 g). 2-3 servings of low-fat dairy each day. One serving is 1 cup  (237 mL). 1 serving of nuts, seeds, or beans 5 times each week. 2-3 servings of heart-healthy fats. Healthy fats called omega-3 fatty acids are found in foods such as walnuts, flaxseeds, fortified milks, and eggs. These fats are also found in cold-water fish, such as sardines, salmon, and mackerel. Limit how much you eat of: Canned or prepackaged foods. Food that is high in trans fat, such as some fried foods. Food that is high in saturated fat, such as fatty meat. Desserts and other sweets, sugary drinks, and other foods with added sugar. Full-fat dairy products. Do not salt foods before eating. Do not eat more than 4 egg yolks a week. Try to eat at least 2 vegetarian meals a week. Eat more home-cooked food and less restaurant, buffet, and fast food.  Lifestyle When eating at a restaurant, ask that your food be prepared with less salt or no salt, if possible. If you drink alcohol: Limit how much you use to: 0-1 drink a day for women who are not pregnant. 0-2 drinks a day for men. Be aware of how much alcohol is in your drink. In the [place] one drink equals one 12 oz bottle of beer (355 mL), one 5 oz glass of wine (148 mL), or one 1 oz glass of hard liquor (44 mL). General information Avoid eating more than 2,300 mg of salt a day. If you have hypertension, you may need to reduce your sodium intake to 1,500 mg a day. Work with your health care provider to maintain a healthy body weight or to lose  weight. Ask what an ideal weight is for you. Get at least 30 minutes of exercise that causes your heart to beat faster (aerobic exercise) most days of the week. Activities may include walking, swimming, or biking. Work with your health care provider or dietitian to adjust your eating plan to your individual calorie needs. What foods should I eat? Fruits All fresh, dried, or frozen fruit. Canned fruit in natural juice (without addedsugar). Vegetables Fresh or frozen vegetables (raw, steamed,  roasted, or grilled). Low-sodium or reduced-sodium tomato and vegetable juice. Low-sodium or reduced-sodium tomatosauce and tomato paste. Low-sodium or reduced-sodium canned vegetables. Grains Whole-grain or whole-wheat bread. Whole-grain or whole-wheat pasta. Brown rice. [place]. Bulgur. Whole-grain and low-sodium cereals. Pita bread.Low-fat, low-sodium crackers. Whole-wheat flour tortillas. Meats and other proteins Skinless chicken or [place]. Ground chicken or [place]. Pork with fat trimmed off. Fish and seafood. Egg whites. Dried beans, peas, or lentils. Unsalted nuts, nut butters, and seeds. Unsalted canned beans. Lean cuts of beef with fat trimmed off. Low-sodium, lean precooked or cured meat, such as sausages or meatloaves. Dairy Low-fat (1%) or fat-free (skim) milk. Reduced-fat, low-fat, or fat-free cheeses. Nonfat, low-sodium ricotta or cottage cheese. Low-fat or nonfatyogurt. Low-fat, low-sodium cheese. Fats and oils Soft margarine without trans fats. Vegetable oil. Reduced-fat, low-fat, or light mayonnaise and salad dressings (reduced-sodium). Canola, safflower, olive, avocado, soybean, andsunflower oils. Avocado. Seasonings and condiments Herbs. Spices. Seasoning mixes without salt. Other foods Unsalted popcorn and pretzels. Fat-free sweets. The items listed above may not be a complete list of foods and beverages you can eat. Contact a dietitian for more information. What foods should I avoid? Fruits Canned fruit in a light or heavy syrup. Fried fruit. Fruit in cream or buttersauce. Vegetables Creamed or fried vegetables. Vegetables in a cheese sauce. Regular canned vegetables (not low-sodium or reduced-sodium). Regular canned tomato sauce and paste (not low-sodium or reduced-sodium). Regular tomato and vegetable juice(not low-sodium or reduced-sodium). Angie Fava. Olives. Grains Baked goods made with fat, such as croissants, muffins, or some breads. Drypasta or rice meal  packs. Meats and other proteins Fatty cuts of meat. Ribs. Fried meat. Berniece Salines. Bologna, salami, and other precooked or cured meats, such as sausages or meat loaves. Fat from the back of a pig (fatback). Bratwurst. Salted nuts and seeds. Canned beans with added salt. Canned orsmoked fish. Whole eggs or egg yolks. Chicken or [place] with skin. Dairy Whole or 2% milk, cream, and half-and-half. Whole or full-fat cream cheese. Whole-fat or sweetened yogurt. Full-fat cheese. Nondairy creamers. Whippedtoppings. Processed cheese and cheese spreads. Fats and oils Butter. Stick margarine. Lard. Shortening. Ghee. Bacon fat. Tropical oils, suchas coconut, palm kernel, or palm oil. Seasonings and condiments Onion salt, garlic salt, seasoned salt, table salt, and sea salt. Worcestershire sauce. Tartar sauce. Barbecue sauce. Teriyaki sauce. Soy sauce, including reduced-sodium. Steak sauce. Canned and packaged gravies. Fish sauce. Oyster sauce. Cocktail sauce. Store-bought horseradish. Ketchup. Mustard. Meat flavorings and tenderizers. Bouillon cubes. Hot sauces. Pre-made or packaged marinades. Pre-made or packaged taco seasonings. Relishes. Regular saladdressings. Other foods Salted popcorn and pretzels. The items listed above may not be a complete list of foods and beverages you should avoid. Contact a dietitian for more information. Where to find more information National Heart, Lung, and Blood Institute: https://wilson-eaton.com/ American Heart Association: www.heart.org Academy of Nutrition and Dietetics: www.eatright.[place]: www.kidney.org Summary The DASH eating plan is a healthy eating plan that has been shown to reduce high blood pressure (hypertension). It may also reduce your risk for  type 2 diabetes, heart disease, and stroke. When on the DASH eating plan, aim to eat more fresh fruits and vegetables, whole grains, lean proteins, low-fat dairy, and heart-healthy fats. With the DASH eating  plan, you should limit salt (sodium) intake to 2,300 mg a day. If you have hypertension, you may need to reduce your sodium intake to 1,500 mg a day. Work with your health care provider or dietitian to adjust your eating plan to your individual calorie needs. This information is not intended to replace advice given to you by your health care provider. Make sure you discuss any questions you have with your healthcare provider. Document Revised: 12/05/2018 Document Reviewed: 12/05/2018 Elsevier Patient Education  2022 [employer].

## 2020-07-08 NOTE — Assessment & Plan Note (Signed)
Update contract and collect UDS today PMP database reviewed: 04/23/19, 05/29/19,07/14/19 (no red flags) Resume dexedrine 15mg  ER F/up in 85month

## 2020-07-08 NOTE — Assessment & Plan Note (Signed)
Stable mood with zoloft 50mg 

## 2020-07-08 NOTE — Assessment & Plan Note (Signed)
Repeat lipid panel ?

## 2020-07-11 ENCOUNTER — Other Ambulatory Visit (INDEPENDENT_AMBULATORY_CARE_PROVIDER_SITE_OTHER): Payer: No Typology Code available for payment source

## 2020-07-11 ENCOUNTER — Other Ambulatory Visit (HOSPITAL_COMMUNITY): Payer: Self-pay

## 2020-07-11 DIAGNOSIS — Z6841 Body Mass Index (BMI) 40.0 and over, adult: Secondary | ICD-10-CM

## 2020-07-11 DIAGNOSIS — R739 Hyperglycemia, unspecified: Secondary | ICD-10-CM

## 2020-07-11 DIAGNOSIS — E66813 Obesity, class 3: Secondary | ICD-10-CM

## 2020-07-11 DIAGNOSIS — Z8632 Personal history of gestational diabetes: Secondary | ICD-10-CM | POA: Diagnosis not present

## 2020-07-11 LAB — HEMOGLOBIN A1C: Hgb A1c MFr Bld: 6.5 % (ref 4.6–6.5)

## 2020-07-12 ENCOUNTER — Encounter: Payer: Self-pay | Admitting: Nurse Practitioner

## 2020-07-12 ENCOUNTER — Other Ambulatory Visit (INDEPENDENT_AMBULATORY_CARE_PROVIDER_SITE_OTHER): Payer: No Typology Code available for payment source

## 2020-07-12 DIAGNOSIS — E782 Mixed hyperlipidemia: Secondary | ICD-10-CM

## 2020-07-12 DIAGNOSIS — Z79899 Other long term (current) drug therapy: Secondary | ICD-10-CM

## 2020-07-12 DIAGNOSIS — I10 Essential (primary) hypertension: Secondary | ICD-10-CM

## 2020-07-12 DIAGNOSIS — B373 Candidiasis of vulva and vagina: Secondary | ICD-10-CM

## 2020-07-12 DIAGNOSIS — D500 Iron deficiency anemia secondary to blood loss (chronic): Secondary | ICD-10-CM

## 2020-07-12 DIAGNOSIS — B3731 Acute candidiasis of vulva and vagina: Secondary | ICD-10-CM

## 2020-07-12 DIAGNOSIS — F909 Attention-deficit hyperactivity disorder, unspecified type: Secondary | ICD-10-CM

## 2020-07-12 DIAGNOSIS — Z6841 Body Mass Index (BMI) 40.0 and over, adult: Secondary | ICD-10-CM | POA: Diagnosis not present

## 2020-07-12 DIAGNOSIS — R739 Hyperglycemia, unspecified: Secondary | ICD-10-CM

## 2020-07-12 LAB — CBC WITH DIFFERENTIAL/PLATELET
Basophils Absolute: 0.1 10*3/uL (ref 0.0–0.1)
Basophils Relative: 1 % (ref 0.0–3.0)
Eosinophils Absolute: 0.2 10*3/uL (ref 0.0–0.7)
Eosinophils Relative: 1.7 % (ref 0.0–5.0)
HCT: 32.4 % — ABNORMAL LOW (ref 36.0–46.0)
Hemoglobin: 10.6 g/dL — ABNORMAL LOW (ref 12.0–15.0)
Lymphocytes Relative: 33.5 % (ref 12.0–46.0)
Lymphs Abs: 3 10*3/uL (ref 0.7–4.0)
MCHC: 32.7 g/dL (ref 30.0–36.0)
MCV: 68.7 fl — ABNORMAL LOW (ref 78.0–100.0)
Monocytes Absolute: 0.5 10*3/uL (ref 0.1–1.0)
Monocytes Relative: 5.5 % (ref 3.0–12.0)
Neutro Abs: 5.2 10*3/uL (ref 1.4–7.7)
Neutrophils Relative %: 58.3 % (ref 43.0–77.0)
Platelets: 459 10*3/uL — ABNORMAL HIGH (ref 150.0–400.0)
RBC: 4.72 Mil/uL (ref 3.87–5.11)
RDW: 16.2 % — ABNORMAL HIGH (ref 11.5–15.5)
WBC: 8.9 10*3/uL (ref 4.0–10.5)

## 2020-07-12 LAB — COMPREHENSIVE METABOLIC PANEL
ALT: 15 U/L (ref 0–35)
AST: 14 U/L (ref 0–37)
Albumin: 4.2 g/dL (ref 3.5–5.2)
Alkaline Phosphatase: 45 U/L (ref 39–117)
BUN: 11 mg/dL (ref 6–23)
CO2: 26 mEq/L (ref 19–32)
Calcium: 9.1 mg/dL (ref 8.4–10.5)
Chloride: 103 mEq/L (ref 96–112)
Creatinine, Ser: 0.86 mg/dL (ref 0.40–1.20)
GFR: 87.13 mL/min (ref >60.00)
Glucose, Bld: 113 mg/dL — ABNORMAL HIGH (ref 70–99)
Potassium: 3.7 mEq/L (ref 3.5–5.1)
Sodium: 137 mEq/L (ref 135–145)
Total Bilirubin: 0.4 mg/dL (ref 0.2–1.2)
Total Protein: 7.2 g/dL (ref 6.0–8.3)

## 2020-07-12 LAB — LIPID PANEL
Cholesterol: 154 mg/dL (ref 0–200)
HDL: 39.6 mg/dL (ref >39.00)
LDL Cholesterol: 97 mg/dL (ref 0–99)
NonHDL: 114.21
Total CHOL/HDL Ratio: 4
Triglycerides: 88 mg/dL (ref 0.0–149.0)
VLDL: 17.6 mg/dL (ref 0.0–40.0)

## 2020-07-12 LAB — TSH: TSH: 2.71 u[IU]/mL (ref 0.35–4.50)

## 2020-07-13 ENCOUNTER — Other Ambulatory Visit (HOSPITAL_COMMUNITY): Payer: Self-pay

## 2020-07-13 MED ORDER — FLUCONAZOLE 150 MG PO TABS
ORAL_TABLET | ORAL | 0 refills | Status: DC
  Filled 2020-07-13 – 2020-07-20 (×2): qty 2, 3d supply, fill #0

## 2020-07-14 ENCOUNTER — Other Ambulatory Visit (HOSPITAL_COMMUNITY): Payer: Self-pay

## 2020-07-20 ENCOUNTER — Other Ambulatory Visit (HOSPITAL_COMMUNITY): Payer: Self-pay

## 2020-07-23 LAB — DRUG MONITORING, PANEL 8 WITH CONFIRMATION, URINE
6 Acetylmorphine: NEGATIVE ng/mL (ref <10)
Alcohol Metabolites: NEGATIVE ng/mL (ref <500)
Amphetamine: 5300 ng/mL — ABNORMAL HIGH (ref <250)
Amphetamines: POSITIVE ng/mL — AB (ref <500)
Benzodiazepines: NEGATIVE ng/mL (ref <100)
Buprenorphine, Urine: NEGATIVE ng/mL (ref <5)
Cocaine Metabolite: NEGATIVE ng/mL (ref <150)
Creatinine: 143.2 mg/dL (ref >=20.0)
MDMA: NEGATIVE ng/mL (ref <500)
Marijuana Metabolite: NEGATIVE ng/mL (ref <20)
Methamphetamine: NEGATIVE ng/mL (ref <250)
Opiates: NEGATIVE ng/mL (ref <100)
Oxidant: NEGATIVE ug/mL (ref <200)
Oxycodone: NEGATIVE ng/mL (ref <100)
pH: 6 (ref 4.5–9.0)

## 2020-07-23 LAB — DM TEMPLATE

## 2020-07-25 ENCOUNTER — Other Ambulatory Visit (HOSPITAL_COMMUNITY): Payer: Self-pay

## 2020-07-25 MED FILL — Labetalol HCl Tab 200 MG: ORAL | 30 days supply | Qty: 90 | Fill #1 | Status: AC

## 2020-07-26 ENCOUNTER — Encounter: Payer: Self-pay | Admitting: Nurse Practitioner

## 2020-07-27 ENCOUNTER — Other Ambulatory Visit (HOSPITAL_COMMUNITY): Payer: Self-pay

## 2020-07-27 ENCOUNTER — Other Ambulatory Visit: Payer: Self-pay | Admitting: Family

## 2020-07-27 MED ORDER — OZEMPIC (0.25 OR 0.5 MG/DOSE) 2 MG/1.5ML ~~LOC~~ SOPN
0.5000 mg | PEN_INJECTOR | SUBCUTANEOUS | 0 refills | Status: DC
  Filled 2020-07-27: qty 4.5, 84d supply, fill #0

## 2020-07-29 ENCOUNTER — Other Ambulatory Visit (HOSPITAL_COMMUNITY): Payer: Self-pay

## 2020-08-30 ENCOUNTER — Encounter: Payer: Self-pay | Admitting: Nurse Practitioner

## 2020-09-09 ENCOUNTER — Other Ambulatory Visit (HOSPITAL_COMMUNITY): Payer: Self-pay

## 2020-09-09 MED FILL — Omeprazole Cap Delayed Release 40 MG: ORAL | 30 days supply | Qty: 60 | Fill #1 | Status: AC

## 2020-09-28 ENCOUNTER — Other Ambulatory Visit (HOSPITAL_COMMUNITY): Payer: Self-pay

## 2020-09-28 MED ORDER — LABETALOL HCL 200 MG PO TABS
ORAL_TABLET | ORAL | 1 refills | Status: DC
  Filled 2020-09-28: qty 180, 90d supply, fill #0
  Filled 2021-02-03: qty 180, 90d supply, fill #1

## 2020-10-20 ENCOUNTER — Telehealth: Payer: No Typology Code available for payment source | Admitting: Physician Assistant

## 2020-10-20 ENCOUNTER — Other Ambulatory Visit (HOSPITAL_COMMUNITY): Payer: Self-pay

## 2020-10-20 DIAGNOSIS — B9789 Other viral agents as the cause of diseases classified elsewhere: Secondary | ICD-10-CM | POA: Diagnosis not present

## 2020-10-20 DIAGNOSIS — J019 Acute sinusitis, unspecified: Secondary | ICD-10-CM | POA: Diagnosis not present

## 2020-10-20 MED ORDER — FLUTICASONE PROPIONATE 50 MCG/ACT NA SUSP
2.0000 | Freq: Every day | NASAL | 0 refills | Status: DC
  Filled 2020-10-20: qty 16, 30d supply, fill #0

## 2020-10-20 MED ORDER — IPRATROPIUM BROMIDE 0.03 % NA SOLN
2.0000 | Freq: Two times a day (BID) | NASAL | 12 refills | Status: DC
  Filled 2020-10-20: qty 30, 43d supply, fill #0

## 2020-10-20 NOTE — Progress Notes (Signed)
E-Visit for Sinus Problems  We are sorry that you are not feeling well.  Here is how we plan to help!  Based on what you have shared with me it looks like you have sinusitis.  Sinusitis is inflammation and infection in the sinus cavities of the head.  Based on your presentation I believe you most likely have Acute Viral Sinusitis.This is an infection most likely caused by a virus. There is not specific treatment for viral sinusitis other than to help you with the symptoms until the infection runs its course.  You may use an oral decongestant such as Mucinex D or if you have glaucoma or high blood pressure use plain Mucinex. Saline nasal spray help and can safely be used as often as needed for congestion, I have prescribed: Fluticasone nasal spray two sprays in each nostril once a day and Ipratropium Bromide nasal spray 0.03% 2 sprays in eah nostril 2-3 times a day  Some authorities believe that zinc sprays or the use of Echinacea may shorten the course of your symptoms.  Sinus infections are not as easily transmitted as other respiratory infection, however we still recommend that you avoid close contact with loved ones, especially the very young and elderly.  Remember to wash your hands thoroughly throughout the day as this is the number one way to prevent the spread of infection!  Home Care: Only take medications as instructed by your medical team. Do not take these medications with alcohol. A steam or ultrasonic humidifier can help congestion.  You can place a towel over your head and breathe in the steam from hot water coming from a faucet. Avoid close contacts especially the very young and the elderly. Cover your mouth when you cough or sneeze. Always remember to wash your hands.  Get Help Right Away If: You develop worsening fever or sinus pain. You develop a severe head ache or visual changes. Your symptoms persist after you have completed your treatment plan.  Make sure you Understand  these instructions. Will watch your condition. Will get help right away if you are not doing well or get worse.   Thank you for choosing an e-visit.  Your e-visit answers were reviewed by a board certified advanced clinical practitioner to complete your personal care plan. Depending upon the condition, your plan could have included both over the counter or prescription medications.  Please review your pharmacy choice. Make sure the pharmacy is open so you can pick up prescription now. If there is a problem, you may contact your provider through MyChart messaging and have the prescription routed to another pharmacy.  Your safety is important to us. If you have drug allergies check your prescription carefully.   For the next 24 hours you can use MyChart to ask questions about today's visit, request a non-urgent call back, or ask for a work or school excuse. You will get an email in the next two days asking about your experience. I hope that your e-visit has been valuable and will speed your recovery.  I provided 5 minutes of non face-to-face time during this encounter for chart review and documentation.   

## 2020-10-26 ENCOUNTER — Other Ambulatory Visit (HOSPITAL_COMMUNITY): Payer: Self-pay

## 2020-10-28 ENCOUNTER — Other Ambulatory Visit (HOSPITAL_COMMUNITY): Payer: Self-pay

## 2020-12-06 ENCOUNTER — Other Ambulatory Visit (HOSPITAL_COMMUNITY): Payer: Self-pay

## 2020-12-06 ENCOUNTER — Other Ambulatory Visit: Payer: Self-pay

## 2020-12-06 MED ORDER — OMEPRAZOLE 40 MG PO CPDR
DELAYED_RELEASE_CAPSULE | ORAL | 2 refills | Status: DC
  Filled 2020-12-06 – 2020-12-15 (×2): qty 60, 30d supply, fill #0

## 2020-12-07 ENCOUNTER — Other Ambulatory Visit (HOSPITAL_COMMUNITY): Payer: Self-pay

## 2020-12-14 ENCOUNTER — Other Ambulatory Visit (HOSPITAL_COMMUNITY): Payer: Self-pay

## 2020-12-15 ENCOUNTER — Other Ambulatory Visit (HOSPITAL_COMMUNITY): Payer: Self-pay

## 2020-12-16 ENCOUNTER — Other Ambulatory Visit (HOSPITAL_COMMUNITY): Payer: Self-pay

## 2020-12-16 ENCOUNTER — Telehealth: Payer: Self-pay

## 2020-12-16 MED ORDER — OMEPRAZOLE 40 MG PO CPDR
DELAYED_RELEASE_CAPSULE | Freq: Two times a day (BID) | ORAL | 0 refills | Status: DC
  Filled 2020-12-16 (×2): qty 60, 30d supply, fill #0

## 2020-12-16 NOTE — Telephone Encounter (Signed)
-----   Message from Loralie Champagne, PA-C sent at 12/16/2020  9:20 AM EST ----- Patient requesting refill on her omeprazole 40 mg BID.  Please send a prescription for one month supply to [facility] outpatient pharmacy.  Thank you,  Jess

## 2020-12-16 NOTE — Telephone Encounter (Signed)
Omeprazole refilled and patient informed.

## 2021-02-04 ENCOUNTER — Other Ambulatory Visit (HOSPITAL_COMMUNITY): Payer: Self-pay

## 2021-02-06 ENCOUNTER — Other Ambulatory Visit (HOSPITAL_COMMUNITY): Payer: Self-pay

## 2021-02-17 ENCOUNTER — Ambulatory Visit: Payer: BC Managed Care – PPO | Admitting: Gastroenterology

## 2021-02-17 ENCOUNTER — Encounter: Payer: Self-pay | Admitting: Gastroenterology

## 2021-02-17 ENCOUNTER — Other Ambulatory Visit (HOSPITAL_COMMUNITY): Payer: Self-pay

## 2021-02-17 VITALS — BP 128/80 | HR 78

## 2021-02-17 DIAGNOSIS — K219 Gastro-esophageal reflux disease without esophagitis: Secondary | ICD-10-CM | POA: Diagnosis not present

## 2021-02-17 MED ORDER — PANTOPRAZOLE SODIUM 40 MG PO TBEC
40.0000 mg | DELAYED_RELEASE_TABLET | Freq: Every day | ORAL | 3 refills | Status: DC
  Filled 2021-02-17: qty 90, 90d supply, fill #0
  Filled 2021-06-09 – 2021-09-15 (×2): qty 90, 90d supply, fill #1
  Filled 2021-12-19: qty 90, 90d supply, fill #2

## 2021-02-17 NOTE — Progress Notes (Signed)
02/17/2021 Cheryl Ayala [id number] December 01, 1985   HISTORY OF PRESENT ILLNESS: This is a 36 year old female who is a patient of Dr. Woodward Ku.  She has a history of GERD and hiatal hernia.  She is currently on omeprazole 40 mg daily with a second dose as needed and needs refills on her PPI.  She says that she does not feel like her acid reflux is as well-controlled as it could be and would like to try something different.  She had an EGD in January 2020 with a hiatal hernia, fundic gland polyps and gastritis.  Gastric biopsies showed mild reactive changes, negative for H. Pylori.   Past Medical History:  Diagnosis Date   Anxiety    GERD (gastroesophageal reflux disease)    Hypersomnia 12/23/2015   Hypertension    Plantar fasciitis    Preterm premature rupture of membranes 01/05/2020   Venous insufficiency    Past Surgical History:  Procedure Laterality Date   CESAREAN SECTION MULTI-GESTATIONAL N/A 01/25/2020   Procedure: CESAREAN SECTION MULTI-GESTATIONAL;  Surgeon: Linda Hedges, DO;  Location: [facility];  Service: Obstetrics;  Laterality: N/A;   CHOLECYSTECTOMY, LAPAROSCOPIC  04/09/2019   ESOPHAGEAL MANOMETRY N/A 09/25/2017   Procedure: ESOPHAGEAL MANOMETRY (EM);  Surgeon: Mauri Pole, MD;  Location: [facility];  Service: Endoscopy;  Laterality: N/A;   IVF     PLANTAR FASCIA RELEASE Bilateral    2019 and 2020   WISDOM TOOTH EXTRACTION  2013    reports that she has never smoked. She has never used smokeless tobacco. She reports current alcohol use. She reports that she does not use drugs. family history includes AAA (abdominal aortic aneurysm) in her father; Alzheimer's disease in her maternal grandmother; Anxiety disorder in her father and sister; Cancer in her mother; Depression in her father and sister; Gout in her maternal grandfather; Heart disease in her father, maternal grandmother, and paternal grandfather; Hyperlipidemia in her father; Hypertension in her father;  Stroke in her maternal grandmother. Allergies  Allergen Reactions   Penicillins Rash    Has patient had a PCN reaction causing immediate rash, facial/tongue/throat swelling, SOB or lightheadedness with hypotension: Yes Has patient had a PCN reaction causing severe rash involving mucus membranes or skin necrosis: No Has patient had a PCN reaction that required hospitalization: No Has patient had a PCN reaction occurring within the last 10 years: No If all of the above answers are "NO", then may proceed with Cephalosporin use.    Sulfa Antibiotics Rash      Outpatient Encounter Medications as of 02/17/2021  Medication Sig   amLODipine (NORVASC) 10 MG tablet Take 1 tablet (10 mg total) by mouth daily.   blood glucose meter kit and supplies Dispense based on patient and insurance preference. Use once a day before breakfast. ICD10:E11.65   Blood Glucose Monitoring Suppl (FREESTYLE LITE) w/Device KIT Use to directed to test blood sugar once a day befor breakfast   dextroamphetamine (DEXEDRINE SPANSULE) 15 MG 24 hr capsule Take 1 capsule (15 mg total) by mouth every morning.   glucose blood test strip Use as directed to check blood sugar once a day before breakfast   labetalol (NORMODYNE) 200 MG tablet Take 1 tablet by mouth twice a day   Lancets (FREESTYLE) lancets Use as directed to check blood sugar once a day before breakfast   omeprazole (PRILOSEC) 40 MG capsule TAKE 1 CAPSULE BY MOUTH 2 TIMES DAILY   pantoprazole (PROTONIX) 40 MG tablet Take 1 tablet (40  mg total) by mouth daily.   sertraline (ZOLOFT) 100 MG tablet TAKE 1 TABLET BY MOUTH ONCE A DAY (Patient taking differently: Take by mouth daily. Pt taking 1/2 pill at bedtime.)   [DISCONTINUED] fluconazole (DIFLUCAN) 150 MG tablet Take 1 tablet by mouth for 1 dose , may repeat once in 48-72hrs (Patient not taking: Reported on 02/17/2021)   [DISCONTINUED] fluticasone (FLONASE) 50 MCG/ACT nasal spray Place 2 sprays into both nostrils daily.  (Patient not taking: Reported on 02/17/2021)   [DISCONTINUED] ipratropium (ATROVENT) 0.03 % nasal spray Place 2 sprays into both nostrils every 12 (twelve) hours. (Patient not taking: Reported on 02/17/2021)   [DISCONTINUED] Semaglutide,0.25 or 0.5MG/DOS, (OZEMPIC, 0.25 OR 0.5 MG/DOSE,) 2 MG/1.5ML SOPN Inject 0.5 mg into the skin once a week. (Patient not taking: Reported on 02/17/2021)   No facility-administered encounter medications on file as of 02/17/2021.     REVIEW OF SYSTEMS  : All other systems reviewed and negative except where noted in the History of Present Illness.   PHYSICAL EXAM: BP 128/80    Pulse 78    SpO2 98%  General: Well developed white female in no acute distress Head: Normocephalic and atraumatic Eyes:  Sclerae anicteric, conjunctiva pink. Ears: Normal auditory acuity Lungs: Clear throughout to auscultation; no W/R/R. Heart: Regular rate and rhythm; no M/R/G. Abdomen: Soft, non-distended.  BS present.  Non-tender. Musculoskeletal: Symmetrical with no gross deformities  Skin: No lesions on visible extremities Extremities: No edema  Neurological: Alert oriented x 4, grossly non-focal Psychological:  Alert and cooperative. Normal mood and affect  ASSESSMENT AND PLAN: *GERD: Needs refills on her PPI.  Feels like maybe it is not working as well as it had previously.  Is requesting to try something different.  We will try pantoprazole 40 mg daily.  Prescription sent to her pharmacy.  We will increase to twice daily if needed.   CC:  Nche, Charlene Brooke, NP

## 2021-02-17 NOTE — Patient Instructions (Signed)
We have sent the following medications to your pharmacy for you to pick up at your convenience: Pantoprazole  If you are age 37 or older, your body mass index should be between 23-30. Your There is no height or weight on file to calculate BMI. If this is out of the aforementioned range listed, please consider follow up with your Primary Care Provider.  If you are age 14 or younger, your body mass index should be between 19-25. Your There is no height or weight on file to calculate BMI. If this is out of the aformentioned range listed, please consider follow up with your Primary Care Provider.   ________________________________________________________  The [facility] providers would like to encourage you to use [facility] to communicate with providers for non-urgent requests or questions.  Due to long hold times on the telephone, sending your provider a message by [facility] may be a faster and more efficient way to get a response.  Please allow 48 business hours for a response.  Please remember that this is for non-urgent requests.  _______________________________________________________  Due to recent changes in healthcare laws, you may see the results of your imaging and laboratory studies on MyChart before your provider has had a chance to review them.  We understand that in some cases there may be results that are confusing or concerning to you. Not all laboratory results come back in the same time frame and the provider may be waiting for multiple results in order to interpret others.  Please give [place] 48 hours in order for your provider to thoroughly review all the results before contacting the office for clarification of your results.   Thank you for choosing me and [facility].  Janett Billow Zehr-PA

## 2021-02-21 ENCOUNTER — Other Ambulatory Visit (HOSPITAL_COMMUNITY): Payer: Self-pay

## 2021-03-08 NOTE — Progress Notes (Signed)
Reviewed and agree with documentation and assessment and plan. K. Veena Albirta Rhinehart , MD   

## 2021-04-19 ENCOUNTER — Ambulatory Visit: Payer: BC Managed Care – PPO | Admitting: Nurse Practitioner

## 2021-05-04 ENCOUNTER — Encounter: Payer: Self-pay | Admitting: Nurse Practitioner

## 2021-05-04 ENCOUNTER — Ambulatory Visit (INDEPENDENT_AMBULATORY_CARE_PROVIDER_SITE_OTHER): Payer: BC Managed Care – PPO | Admitting: Nurse Practitioner

## 2021-05-04 VITALS — BP 122/80 | HR 83 | Temp 97.6°F | Ht 67.0 in | Wt 311.4 lb

## 2021-05-04 DIAGNOSIS — F32A Depression, unspecified: Secondary | ICD-10-CM | POA: Diagnosis not present

## 2021-05-04 DIAGNOSIS — E1165 Type 2 diabetes mellitus with hyperglycemia: Secondary | ICD-10-CM

## 2021-05-04 DIAGNOSIS — E782 Mixed hyperlipidemia: Secondary | ICD-10-CM | POA: Diagnosis not present

## 2021-05-04 DIAGNOSIS — I10 Essential (primary) hypertension: Secondary | ICD-10-CM

## 2021-05-04 DIAGNOSIS — Z794 Long term (current) use of insulin: Secondary | ICD-10-CM | POA: Diagnosis not present

## 2021-05-04 DIAGNOSIS — F3341 Major depressive disorder, recurrent, in partial remission: Secondary | ICD-10-CM

## 2021-05-04 DIAGNOSIS — F331 Major depressive disorder, recurrent, moderate: Secondary | ICD-10-CM

## 2021-05-04 MED ORDER — METOPROLOL SUCCINATE ER 100 MG PO TB24: 100.0000 mg | ORAL_TABLET | Freq: Every day | ORAL | 3 refills | Status: DC

## 2021-05-04 MED ORDER — AMLODIPINE BESYLATE 10 MG PO TABS: 10.0000 mg | ORAL_TABLET | Freq: Every day | ORAL | 3 refills | Status: DC

## 2021-05-04 MED ORDER — OZEMPIC (0.25 OR 0.5 MG/DOSE) 2 MG/1.5ML ~~LOC~~ SOPN: PEN_INJECTOR | SUBCUTANEOUS | 2 refills | Status: DC

## 2021-05-04 MED ORDER — BUPROPION HCL 75 MG PO TABS: 75.0000 mg | ORAL_TABLET | Freq: Two times a day (BID) | ORAL | 5 refills | Status: DC

## 2021-05-04 NOTE — Assessment & Plan Note (Signed)
Repeat lipid panel ?

## 2021-05-04 NOTE — Progress Notes (Signed)
? ?             Established Patient Visit ? ?Patient: Cheryl Ayala   DOB: December 22, 1985   36 y.o. Female  MRN: [id number] ?Visit Date: 05/04/2021 ? ?Subjective:  ?  ?Chief Complaint  ?Patient presents with  ? Follow-up  ?  F/u meds. C/o anxiety/depression.  Wants blood work today and would like to get back on Ozempic.    ? ?HPI ?DM (diabetes mellitus) ([place]) ?Last hgbA1c at 6.5% ?Previous use of ozempic injection, no adverse effects ?No glucose check at home. ?Normal foot exam today. ?Advised about need for diet modification and regular exercise ? ?Check urine microalbumin, HgbA1c, CMP and lipid panel. ?Resume ozempic 0.25 to 0.35m weekly ?F/up in 190month ?Mixed hyperlipidemia ?Repeat lipid panel ? ?[place]?Admits to stress eating and inadequate sleep due to toddler twins ?Advised about need for heart healthy diet and regular exercise. ?She is resume ozempic to help with glucose control and weight loss ?Wt Readings from Last 3 Encounters:  ?05/04/21 (!) 311 lb 6.4 oz (141.3 kg)  ?07/08/20 (!) 323 lb (146.5 kg)  ?01/21/20 (!) 334 lb (151.5 kg)  ? ?F/up in 12m39month?Benign essential hypertension ?BP at goal with labetalol and amlodipine. ?Previous use of metoprolol. D/c due to pregnancy. She wishes to resume metoprolol. ?BP Readings from Last 3 Encounters:  ?05/04/21 122/80  ?02/17/21 128/80  ?07/08/20 130/90  ? ?stop labetalol ?Start metoprolol ?Maintain amlodipine dose ?F/up in 12mo8monthDepression ?No improvement with zoloft 50mg16m feel numb all the time" ?Previous use of lexapro (unable to tolerate). ?No SI/HI/hallucination. ? ?Advised to resume therapy sessions ?D/c zoloft ?Start wellbutrin ?F/up in 12mont18month?  05/04/2021  ?  8:33 AM 07/08/2020  ?  2:50 PM 07/13/2019  ?  9:25 AM  ?Depression screen PHQ 2/9  ?Decreased Interest _0 ?Down, Depressed, Hopeless 0 1 1  ?PHQ - 2 Score _1 ?Altered sleeping 0 0 1  ?Tired, decreased energy _2 ?Change in appetite 3 0 2  ?Feeling bad or failure  about yourself  0 3 1  ?Trouble concentrating 0 0 2  ?Moving slowly or fidgety/restless 0 0 0  ?Suicidal thoughts 0 0 0  ?PHQ-9 Score _3 ?Difficult doing work/chores Somewhat difficult Somewhat difficult   ?  ? ?  05/04/2021  ?  8:33 AM 07/08/2020  ?  2:51 PM 07/13/2019  ?  9:25 AM 07/09/2018  ?  3:21 PM  ?GAD 7 : Generalized Anxiety Score  ?Nervous, Anxious, on Edge _4 ?Control/stop worrying _5 ?Worry too much - different things _6 ?Trouble relaxing _7 ?Restless 0 1 0 0  ?Easily annoyed or irritable _8 ?Afraid - awful might happen 3 1 0 0  ?Total GAD 7 Score _9 ?Anxiety Difficulty Somewhat difficult Somewhat difficult    ? ?Reviewed medical, surgical, and social history today ? ?Medications: ?Outpatient Medications Prior to Visit  ?Medication Sig  ? blood glucose meter kit and supplies Dispense based on patient and insurance preference. Use once a day before breakfast. ICD10:E11.65  ? Blood Glucose Monitoring Suppl (FREESTYLE LITE) w/Device KIT Use to directed to test blood sugar once a day befor breakfast  ? glucose blood test strip Use as directed to check blood sugar once a day  before breakfast  ? Lancets (FREESTYLE) lancets Use as directed to check blood sugar once a day before breakfast  ? omeprazole (PRILOSEC) 40 MG capsule TAKE 1 CAPSULE BY MOUTH 2 TIMES DAILY  ? pantoprazole (PROTONIX) 40 MG tablet Take 1 tablet (40 mg total) by mouth daily.  ? [DISCONTINUED] amLODipine (NORVASC) 10 MG tablet Take 1 tablet (10 mg total) by mouth daily.  ? [DISCONTINUED] labetalol (NORMODYNE) 200 MG tablet Take 1 tablet by mouth twice a day  ? [DISCONTINUED] sertraline (ZOLOFT) 100 MG tablet TAKE 1 TABLET BY MOUTH ONCE A DAY (Patient taking differently: Take by mouth daily. Pt taking 1/2 pill at bedtime.)  ? [DISCONTINUED] dextroamphetamine (DEXEDRINE SPANSULE) 15 MG 24 hr capsule Take 1 capsule (15 mg total) by mouth every morning. (Patient not taking: Reported on 05/04/2021)  ? ?No  facility-administered medications prior to visit.  ? ?Reviewed past medical and social history.  ? ?ROS per HPI above ? ? ?   ?Objective:  ?BP 122/80   Pulse 83   Temp 97.6 ?F (36.4 ?C) (Temporal)   Ht 5' 7" (1.702 m)   Wt (!) 311 lb 6.4 oz (141.3 kg)   SpO2 97%   BMI 48.77 kg/m?  ? ?  ? ?Physical Exam ?Vitals reviewed.  ?Constitutional:   ?   Appearance: She is obese.  ?Cardiovascular:  ?   Rate and Rhythm: Normal rate and regular rhythm.  ?   Pulses: Normal pulses.  ?   Heart sounds: Normal heart sounds.  ?Pulmonary:  ?   Effort: Pulmonary effort is normal.  ?   Breath sounds: Normal breath sounds.  ?Musculoskeletal:  ?   Right lower leg: No edema.  ?   Left lower leg: No edema.  ?Neurological:  ?   Mental Status: She is alert and oriented to person, place, and time.  ?Psychiatric:     ?   Mood and Affect: Mood normal.     ?   Behavior: Behavior normal.     ?   Thought Content: Thought content normal.  ?  ?No results found for any visits on 05/04/21. ?   ?Assessment & Plan:  ?  ?Problem List Items Addressed This Visit   ? ?  ? Cardiovascular and Mediastinum  ? Benign essential hypertension  ?  BP at goal with labetalol and amlodipine. ?Previous use of metoprolol. D/c due to pregnancy. She wishes to resume metoprolol. ?BP Readings from Last 3 Encounters:  ?05/04/21 122/80  ?02/17/21 128/80  ?07/08/20 130/90  ? ?stop labetalol ?Start metoprolol ?Maintain amlodipine dose ?F/up in 75month?  ?  ? Relevant Medications  ? metoprolol succinate (TOPROL-XL) 100 MG 24 hr tablet  ? amLODipine (NORVASC) 10 MG tablet  ? Other Relevant Orders  ? Comprehensive metabolic panel  ?  ? Endocrine  ? DM (diabetes mellitus) ([place]  ?  Last hgbA1c at 6.5% ?Previous use of ozempic injection, no adverse effects ?No glucose check at home. ?Normal foot exam today. ?Advised about need for diet modification and regular exercise ? ?Check urine microalbumin, HgbA1c, CMP and lipid panel. ?Resume ozempic 0.25 to 0.5100mweekly ?F/up  in 43m78month?  ?  ? Relevant Medications  ? Semaglutide,0.25 or 0.5MG/DOS, (OZEMPIC, 0.25 OR 0.5 MG/DOSE,) 2 MG/1.5ML SOPN  ? Other Relevant Orders  ? Comprehensive metabolic panel  ? Hemoglobin A1c  ? Microalbumin / creatinine urine ratio  ?  ? Other  ? Depression  ?  No improvement with zoloft 79m42m  I feel numb all the time" ?Previous use of lexapro (unable to tolerate). ?No SI/HI/hallucination. ? ?Advised to resume therapy sessions ?D/c zoloft ?Start wellbutrin ?F/up in 38month?  ?  ? Relevant Medications  ? buPROPion (WELLBUTRIN) 75 MG tablet  ? Mixed hyperlipidemia  ?  Repeat lipid panel ? ?  ?  ? Relevant Medications  ? metoprolol succinate (TOPROL-XL) 100 MG 24 hr tablet  ? amLODipine (NORVASC) 10 MG tablet  ? Other Relevant Orders  ? Comprehensive metabolic panel  ? Lipid panel  ? Morbid obesity ([place]  ?  Admits to stress eating and inadequate sleep due to toddler twins ?Advised about need for heart healthy diet and regular exercise. ?She is resume ozempic to help with glucose control and weight loss ?Wt Readings from Last 3 Encounters:  ?05/04/21 (!) 311 lb 6.4 oz (141.3 kg)  ?07/08/20 (!) 323 lb (146.5 kg)  ?01/21/20 (!) 334 lb (151.5 kg)  ? ?F/up in 125month  ?  ? Relevant Medications  ? Semaglutide,0.25 or 0.5MG/DOS, (OZEMPIC, 0.25 OR 0.5 MG/DOSE,) 2 MG/1.5ML SOPN  ? RESOLVED: Recurrent major depressive disorder, in partial remission ([place] ? Relevant Medications  ? buPROPion (WELLBUTRIN) 75 MG tablet  ? ?Return in about 4 weeks (around 06/01/2021) for DM, HTN and depression. ? ?  ? ?ChWilfred LacyNP ? ? ?

## 2021-05-04 NOTE — Assessment & Plan Note (Signed)
No improvement with zoloft '50mg'$ -" I feel numb all the time" ?Previous use of lexapro (unable to tolerate). ?No SI/HI/hallucination. ? ?Advised to resume therapy sessions ?D/c zoloft ?Start wellbutrin ?F/up in 32month?

## 2021-05-04 NOTE — Assessment & Plan Note (Signed)
>>  ASSESSMENT AND PLAN FOR BENIGN ESSENTIAL HYPERTENSION WRITTEN ON 05/04/2021  1:30 PM BY Derrin Currey LUM, NP  BP at goal with labetalol and amlodipine. Previous use of metoprolol. D/c due to pregnancy. She wishes to resume metoprolol. BP Readings from Last 3 Encounters:  05/04/21 122/80  02/17/21 128/80  07/08/20 130/90   stop labetalol Start metoprolol Maintain amlodipine dose F/up in 62month

## 2021-05-04 NOTE — Assessment & Plan Note (Deleted)
?  Previous use of lexapro (unable to tolerate) ?

## 2021-05-04 NOTE — Assessment & Plan Note (Addendum)
Admits to stress eating and inadequate sleep due to toddler twins ?Advised about need for heart healthy diet and regular exercise. ?She is resume ozempic to help with glucose control and weight loss ?Wt Readings from Last 3 Encounters:  ?05/04/21 (!) 311 lb 6.4 oz (141.3 kg)  ?07/08/20 (!) 323 lb (146.5 kg)  ?01/21/20 (!) 334 lb (151.5 kg)  ? ?F/up in 69month?

## 2021-05-04 NOTE — Assessment & Plan Note (Signed)
BP at goal with labetalol and amlodipine. ?Previous use of metoprolol. D/c due to pregnancy. She wishes to resume metoprolol. ?BP Readings from Last 3 Encounters:  ?05/04/21 122/80  ?02/17/21 128/80  ?07/08/20 130/90  ? ?stop labetalol ?Start metoprolol ?Maintain amlodipine dose ?F/up in 55month?

## 2021-05-04 NOTE — Assessment & Plan Note (Signed)
>>  ASSESSMENT AND PLAN FOR DEPRESSION WRITTEN ON 05/04/2021  1:38 PM BY Darrie Macmillan LUM, NP  No improvement with zoloft '50mg'$ -" I feel numb all the time" Previous use of lexapro (unable to tolerate). No SI/HI/hallucination.  Advised to resume therapy sessions D/c zoloft Start wellbutrin F/up in 52month

## 2021-05-04 NOTE — Patient Instructions (Signed)
Make medication changes as discussed ?Start ozempic as prescribed ? ?How to Increase Your Level of Physical Activity ?Getting regular physical activity is important for your overall health and well-being. Most people do not get enough exercise. There are easy ways to increase your level of physical activity, even if you have not been very active in the past or if you are just starting out. ?What are the benefits of physical activity? ?Physical activity has many short-term and long-term benefits. Being active on a regular basis can improve your physical and mental health as well as provide other benefits. ?Physical health benefits ?Helping you lose weight or maintain a healthy weight. ?Strengthening your muscles and bones. ?Reducing your risk of certain long-term (chronic) diseases, including heart disease, cancer, and diabetes. ?Being able to move around more easily and for longer periods of time without getting tired (increased endurance or stamina). ?Improving your ability to fight off illness (enhanced immunity). ?Being able to sleep better. ?Helping you stay healthy as you get older, including: ?Helping you stay mobile, or capable of walking and moving around. ?Preventing accidents, such as falls. ?Increasing life expectancy. ?Mental health benefits ?Boosting your mood and improving your self-esteem. ?Lowering your chance of having mental health problems, such as depression or anxiety. ?Helping you feel good about your body. ?Other benefits ?Finding new sources of fun and enjoyment. ?Meeting new people who share a common interest. ?Before you begin ?If you have a chronic illness or have not been active for a while, check with your health care provider about how to get started. Ask your health care provider what activities are safe for you. ?Start out slowly. Walking or doing some simple chair exercises is a good place to start, especially if you have not been active before or for a long time. ?Set goals that you  can work toward. Ask your health care provider how much exercise is best for you. In general, most adults should: ?Do moderate-intensity exercise for at least 150 minutes each week (30 minutes on most days of the week) or vigorous exercise for at least 75 minutes each week, or a combination of these. ?Moderate-intensity exercise can include walking at a quick pace, biking, yoga, water aerobics, or gardening. ?Vigorous exercise involves activities that take more effort, such as jogging or running, playing sports, swimming laps, or jumping rope. ?Do strength exercises on at least 2 days each week. This can include weight lifting, body weight exercises, and resistance-band exercises. ?How to be more physically active ?Make a plan ? ?Try to find activities that you enjoy. You are more likely to commit to an exercise routine if it does not feel like a chore. ?If you have bone or joint problems, choose low-impact exercises, like walking or swimming. ?Use these tips for being successful with an exercise plan: ?Find a workout partner for accountability. ?Join a group or class, such as an aerobics class, cycling class, or sports team. ?Make family time active. Go for a walk, bike, or swim. ?Include a variety of exercises each week. ?Consider using a fitness tracker, such as a mobile phone app or a device worn like a watch, that will count the number of steps you take each day. Many people strive to reach 10,000 steps a day. ?Find ways to be active in your daily routines ?Besides your formal exercise plans, you can find ways to do physical activity during your daily routines, such as: ?Walking or biking to work or to the store. ?Taking the stairs instead of  the elevator. ?Parking farther away from the door at work or at the store. ?Planning walking meetings. ?Walking around while you are on the phone. ?Where to find more information ?Centers for Disease Control and Prevention: WorkDashboard.es ?President's Council  on [employer], Sports & Nutrition: www.fitness.gov ?ChooseMyPlate: MassVoice.es ?Contact a health care provider if: ?You have headaches, muscle aches, or joint pain that is concerning. ?You feel dizzy or light-headed while exercising. ?You faint. ?You feel your heart skipping, racing, or fluttering. ?You have chest pain while exercising. ?Summary ?Exercise benefits your mind and body at any age, even if you are just starting out. ?If you have a chronic illness or have not been active for a while, check with your health care provider before increasing your physical activity. ?Choose activities that are safe and enjoyable for you. Ask your health care provider what activities are safe for you. ?Start slowly. Tell your health care provider if you have problems as you start to increase your activity level. ?This information is not intended to replace advice given to you by your health care provider. Make sure you discuss any questions you have with your health care provider. ?Document Revised: 04/29/2020 Document Reviewed: 04/29/2020 ?Elsevier Patient Education ? [employer]. ? ?Diabetes Mellitus and Nutrition, Adult ?When you have diabetes, or diabetes mellitus, it is very important to have healthy eating habits because your blood sugar (glucose) levels are greatly affected by what you eat and drink. Eating healthy foods in the right amounts, at about the same times every day, can help you: ?Manage your blood glucose. ?Lower your risk of heart disease. ?Improve your blood pressure. ?Reach or maintain a healthy weight. ?What can affect my meal plan? ?Every person with diabetes is different, and each person has different needs for a meal plan. Your health care provider may recommend that you work with a dietitian to make a meal plan that is best for you. Your meal plan may vary depending on factors such as: ?The calories you need. ?The medicines you take. ?Your weight. ?Your blood glucose, blood pressure, and  cholesterol levels. ?Your activity level. ?Other health conditions you have, such as heart or kidney disease. ?How do carbohydrates affect me? ?Carbohydrates, also called carbs, affect your blood glucose level more than any other type of food. Eating carbs raises the amount of glucose in your blood. ?It is important to know how many carbs you can safely have in each meal. This is different for every person. Your dietitian can help you calculate how many carbs you should have at each meal and for each snack. ?How does alcohol affect me? ?Alcohol can cause a decrease in blood glucose (hypoglycemia), especially if you use insulin or take certain diabetes medicines by mouth. Hypoglycemia can be a life-threatening condition. Symptoms of hypoglycemia, such as sleepiness, dizziness, and confusion, are similar to symptoms of having too much alcohol. ?Do not drink alcohol if: ?Your health care provider tells you not to drink. ?You are pregnant, may be pregnant, or are planning to become pregnant. ?If you drink alcohol: ?Limit how much you have to: ?0-1 drink a day for women. ?0-2 drinks a day for men. ?Know how much alcohol is in your drink. In the [place] one drink equals one 12 oz bottle of beer (355 mL), one 5 oz glass of wine (148 mL), or one 1? oz glass of hard liquor (44 mL). ?Keep yourself hydrated with water, diet soda, or unsweetened iced tea. Keep in mind that regular soda, juice, and  other mixers may contain a lot of sugar and must be counted as carbs. ?What are tips for following this plan? ? ?Reading food labels ?Start by checking the serving size on the Nutrition Facts label of packaged foods and drinks. The number of calories and the amount of carbs, fats, and other nutrients listed on the label are based on one serving of the item. Many items contain more than one serving per package. ?Check the total grams (g) of carbs in one serving. ?Check the number of grams of saturated fats and trans fats in one serving.  Choose foods that have a low amount or none of these fats. ?Check the number of milligrams (mg) of salt (sodium) in one serving. Most people should limit total sodium intake to less than 2,300 mg per day

## 2021-05-04 NOTE — Assessment & Plan Note (Addendum)
Last hgbA1c at 6.5% ?Previous use of ozempic injection, no adverse effects ?No glucose check at home. ?Normal foot exam today. ?Advised about need for diet modification and regular exercise ? ?Check urine microalbumin, HgbA1c, CMP and lipid panel. ?Resume ozempic 0.25 to 0.'5mg'$  weekly ?F/up in 36month?

## 2021-05-05 LAB — LIPID PANEL
Cholesterol: 181 mg/dL (ref <200)
HDL: 43 mg/dL — ABNORMAL LOW (ref >=50)
LDL Cholesterol (Calc): 124 mg/dL (calc) — ABNORMAL HIGH
Non-HDL Cholesterol (Calc): 138 mg/dL (calc) — ABNORMAL HIGH (ref <130)
Total CHOL/HDL Ratio: 4.2 (calc) (ref <5.0)
Triglycerides: 58 mg/dL (ref <150)

## 2021-05-05 LAB — COMPREHENSIVE METABOLIC PANEL
AG Ratio: 1.4 (calc) (ref 1.0–2.5)
ALT: 11 U/L (ref 6–29)
AST: 14 U/L (ref 10–30)
Albumin: 3.8 g/dL (ref 3.6–5.1)
Alkaline phosphatase (APISO): 49 U/L (ref 31–125)
BUN: 11 mg/dL (ref 7–25)
CO2: 28 mmol/L (ref 20–32)
Calcium: 8.5 mg/dL — ABNORMAL LOW (ref 8.6–10.2)
Chloride: 105 mmol/L (ref 98–110)
Creat: 0.8 mg/dL (ref 0.50–0.97)
Globulin: 2.7 g/dL (calc) (ref 1.9–3.7)
Glucose, Bld: 106 mg/dL — ABNORMAL HIGH (ref 65–99)
Potassium: 3.8 mmol/L (ref 3.5–5.3)
Sodium: 139 mmol/L (ref 135–146)
Total Bilirubin: 0.5 mg/dL (ref 0.2–1.2)
Total Protein: 6.5 g/dL (ref 6.1–8.1)

## 2021-05-05 LAB — HEMOGLOBIN A1C
Hgb A1c MFr Bld: 6.1 % of total Hgb — ABNORMAL HIGH (ref <5.7)
Mean Plasma Glucose: 128 mg/dL
eAG (mmol/L): 7.1 mmol/L

## 2021-05-05 LAB — MICROALBUMIN / CREATININE URINE RATIO
Creatinine, Urine: 156 mg/dL (ref 20–275)
Microalb Creat Ratio: 13 mcg/mg creat (ref <30)
Microalb, Ur: 2 mg/dL

## 2021-06-09 ENCOUNTER — Other Ambulatory Visit (HOSPITAL_COMMUNITY): Payer: Self-pay

## 2021-07-08 IMAGING — US US MFM OB LIMITED
1 series · 12 of 28 positions shown · non-contrast
Comparison: none

[Series 1: us mfm ob limited · 12 of 85 slices shown]
[im 4/85]
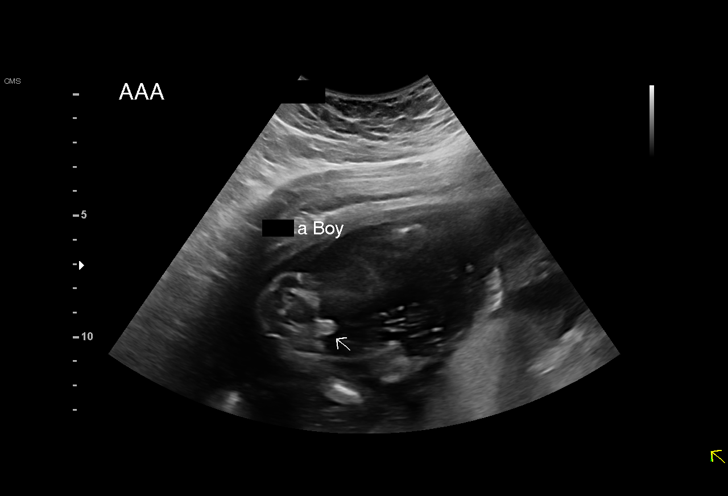
[im 10/85]
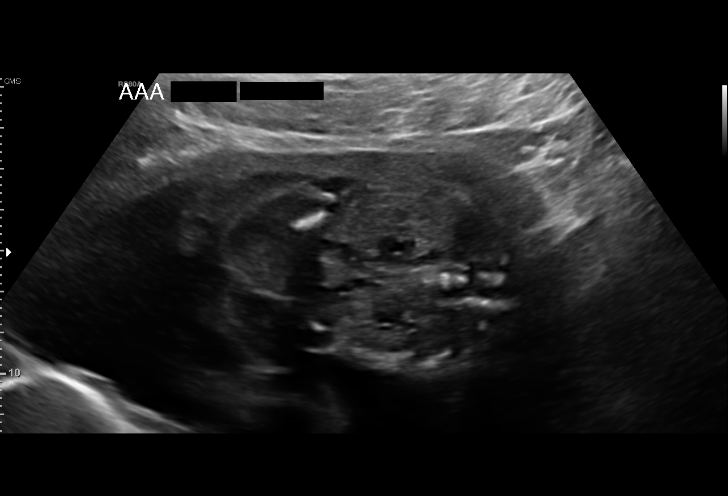
[im 16/85]
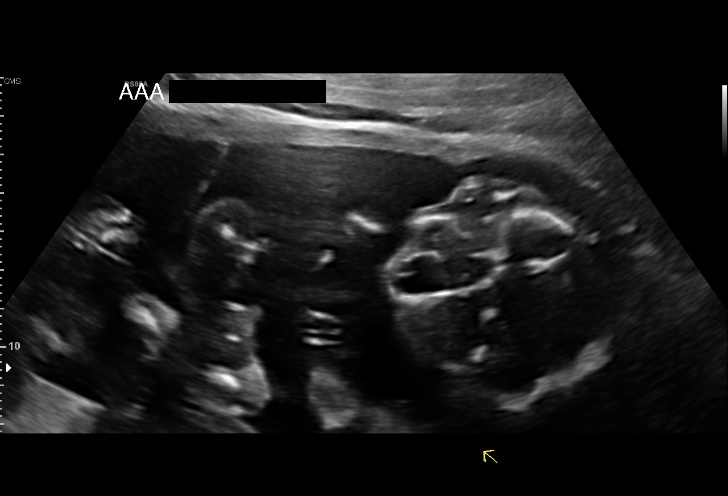
[im 25/85]
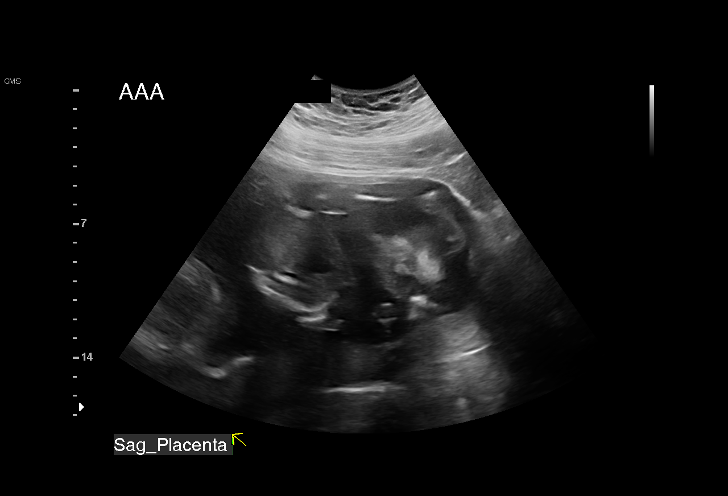
[im 32/85]
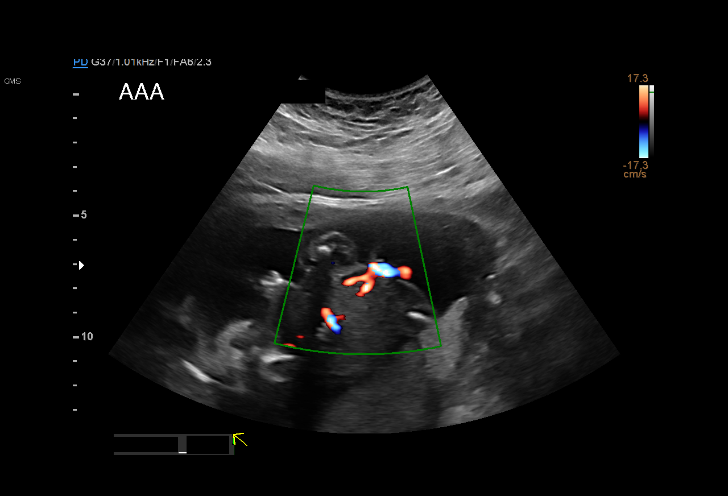
[im 38/85]
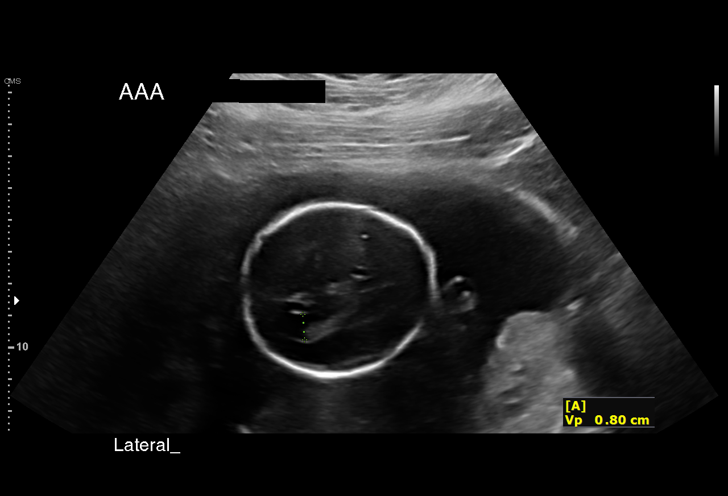
[im 47/85]
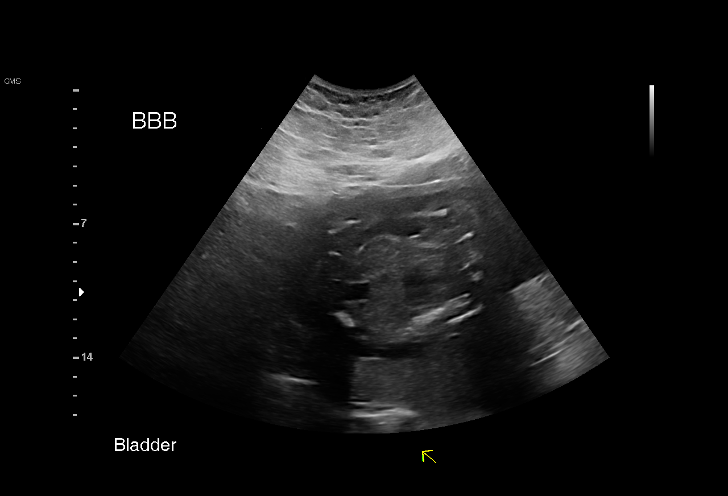
[im 53/85]
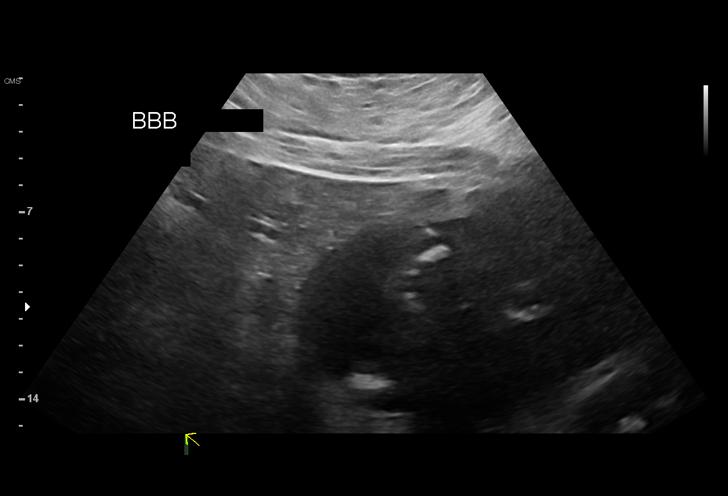
[im 60/85]
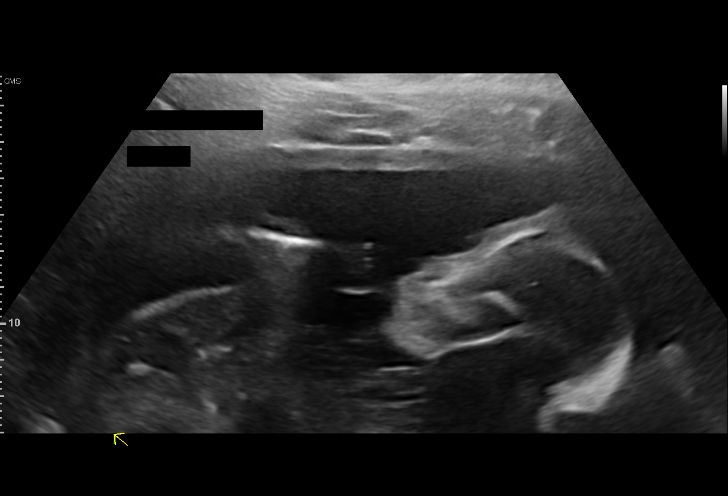
[im 69/85]
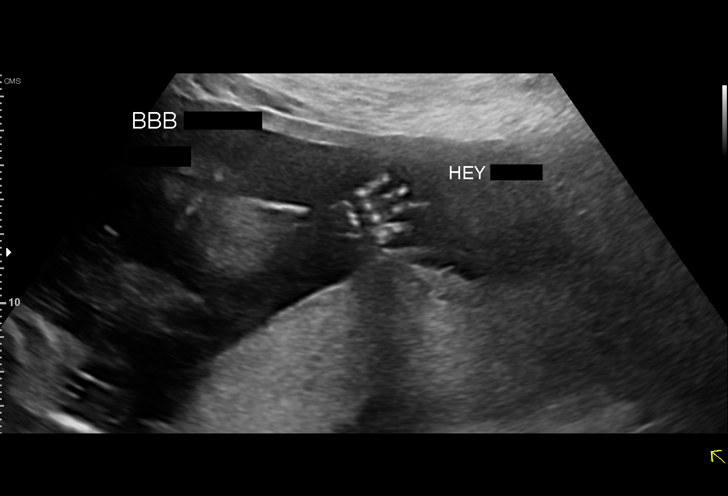
[im 75/85]
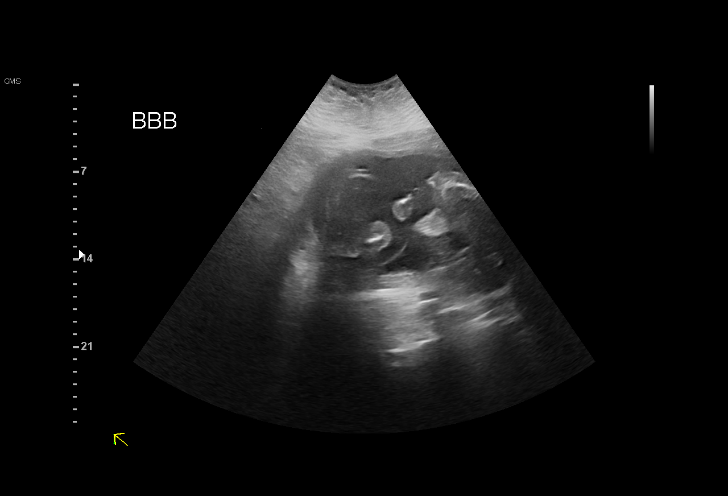
[im 81/85]
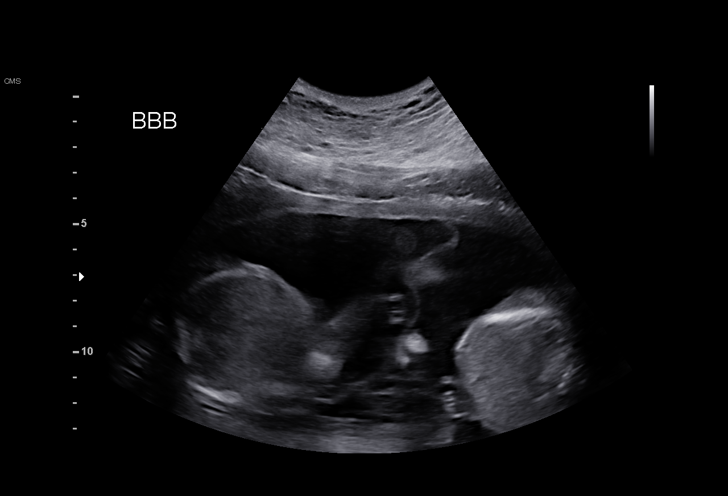

[12 of 28 positions shown; findings below may reference images not displayed]

[REDACTED]. [HOSPITAL]
                   DO

Indications

 Twin pregnancy, [place]jax/Crozier, second trimester
 Encounter for other antenatal screening
 follow-up
 21 weeks gestation of pregnancy
 Obesity complicating pregnancy, second
 trimester (BMI 48)
 Hypertension - Chronic/Pre-existing
 (Labetalol)
 Advanced maternal age multigravida 35+,
 second trimester
 Pregnancy resulting from assisted
 reproductive technology (IVF)
 Genetic carrier (specify)
Fetal Evaluation (Fetus A)

 Num Of Fetuses:         2
 Fetal Heart Rate(bpm):  152
 Cardiac Activity:       Observed
 Fetal Lie:              Lower Fetus
 Presentation:           Variable
 Placenta:               Posterior
 P. Cord Insertion:      Previously Visualized
 Membrane Desc:      Dividing Membrane seen

 Amniotic Fluid
 AFI FV:      Within normal limits

                             Largest Pocket(cm)

OB History

 Gravidity:    1         Term:   0
 Living:       0
Gestational Age (Fetus A)

 LMP:           21w 4d        Date:  07/02/19                 EDD:   04/07/20
 Best:          21w 4d     Det. By:  LMP  (07/02/19)          EDD:   04/07/20
Anatomy (Fetus A)

 Cranium:               Appears normal         LVOT:                   Not well visualized
 Cavum:                 Previously seen        Aortic Arch:            Not well visualized
 Ventricles:            Appears normal         Ductal Arch:            Appears normal
 Choroid Plexus:        Previously seen        Diaphragm:              Previously seen
 Cerebellum:            Previously seen        Stomach:                Appears normal, left
                                                                       sided
 Posterior Fossa:       Previously seen        Abdomen:                Appears normal
 Nuchal Fold:           Previously seen        Abdominal Wall:         Appears nml (cord
                                                                       insert, abd wall)
 Face:                  Appears normal         Cord Vessels:           Appears normal (3
                        (orbits and profile)                           vessel cord)
 Lips:                  Previously seen        Kidneys:                Visualized
 Palate:                Not well visualized    Bladder:                Appears normal
 Thoracic:              Previously seen        Spine:                  Limited views
                                                                       appear normal
 Heart:                 Not well visualized    Upper Extremities:      Visualized
 RVOT:                  Appears normal         Lower Extremities:      Visualized

 Other:  Nasal bone visualized. 3VV visualized. Technically difficult due to
         maternal habitus and fetal position.

Fetal Evaluation (Fetus B)

 Num Of Fetuses:         2
 Fetal Heart Rate(bpm):  148
 Cardiac Activity:       Observed
 Fetal Lie:              Upper Fetus
 Presentation:           Transverse, head to maternal left
 Placenta:               Posterior
 P. Cord Insertion:      Not well visualized
 Membrane Desc:      Dividing Membrane seen

 Amniotic Fluid
 AFI FV:      Within normal limits

                             Largest Pocket(cm)

Gestational Age (Fetus B)

 LMP:           21w 4d        Date:  07/02/19                 EDD:   04/07/20
 Best:          21w 4d     Det. By:  LMP  (07/02/19)          EDD:   04/07/20
Anatomy (Fetus B)
 Cranium:               Appears normal         LVOT:                   Not well visualized
 Cavum:                 Not well visualized    Aortic Arch:            Not well visualized
 Ventricles:            Previously seen        Ductal Arch:            Not well visualized
 Choroid Plexus:        Previously seen        Diaphragm:              Appears normal
 Cerebellum:            Not well visualized    Stomach:                Appears normal, left
                                                                       sided
 Posterior Fossa:       Not well visualized    Abdomen:                Appears normal
 Nuchal Fold:           Not applicable (>20    Abdominal Wall:         Appears nml (cord
                        wks GA)                                        insert, abd wall)
 Face:                  Appears normal         Cord Vessels:           Appears normal (3
                        (orbits and profile)                           vessel cord)
 Lips:                  Appears normal         Kidneys:                Appear normal
 Palate:                Not well visualized    Bladder:                Appears normal
 Thoracic:              Previously seen        Spine:                  Not well visualized
 Heart:                 Not well visualized    Upper Extremities:      Visualized
 RVOT:                  Not well visualized    Lower Extremities:      Visualized

 Other:  Left 5th digit visualized. Nasal bone visualized.
Cervix Uterus Adnexa

 Cervix
 Length:            3.9  cm.
 Normal appearance by transabdominal scan.

 Uterus
 No abnormality visualized.

 Right Ovary
 Not visualized.

 Left Ovary
 Not visualized.

 Cul De Sac
 No free fluid seen.

 Adnexa
 No abnormality visualized.
Comments

 This patient was seen for a limited ultrasound due to a
 monochorionic, diamniotic twin gestation conceived through
 in vitro fertilization.  She denies any problems since her last
 exam.  The patient has screened positive as a carrier for
 tyrosinemia.  She reports that her husband has already been
 tested and has screened negative as a carrier for
 tyrosinemia.  She denies any problems since her last exam.

 There were no signs of the twin to twin transfusion syndrome
 noted on today's ultrasound exam.  There was normal
 amniotic fluid noted around both fetuses.

 She will return in 2 weeks for a growth scan.

## 2021-08-12 IMAGING — US US MFM OB LIMITED
1 series · 14 of 28 positions shown · non-contrast
Comparison: none

[Series 1: us mfm ob limited · 28 acquisitions, 14 frames shown]
[im 2/28]
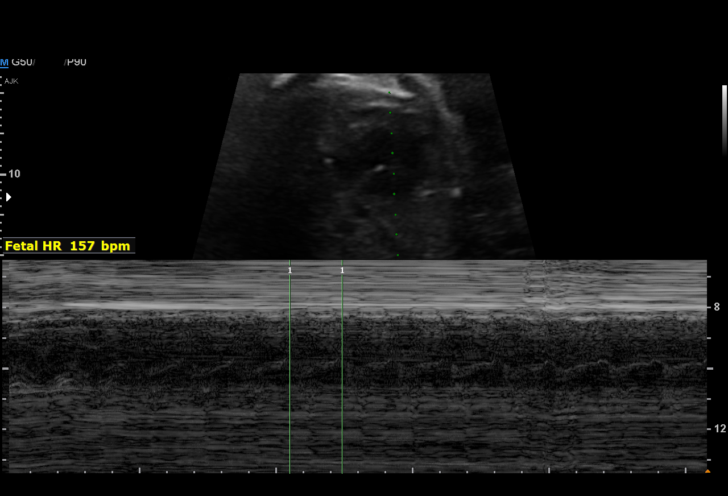
[im 4/28]
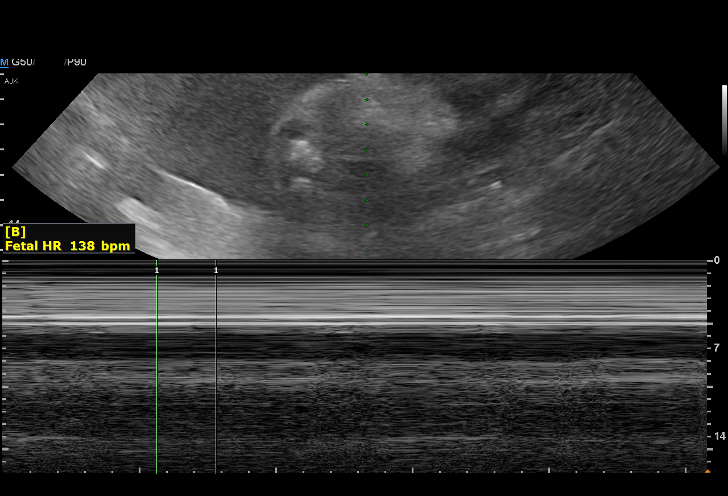
[im 6/28]
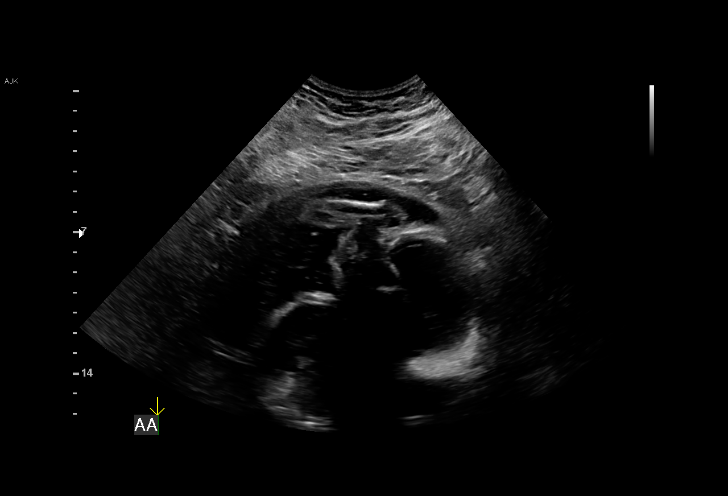
[im 8/28]
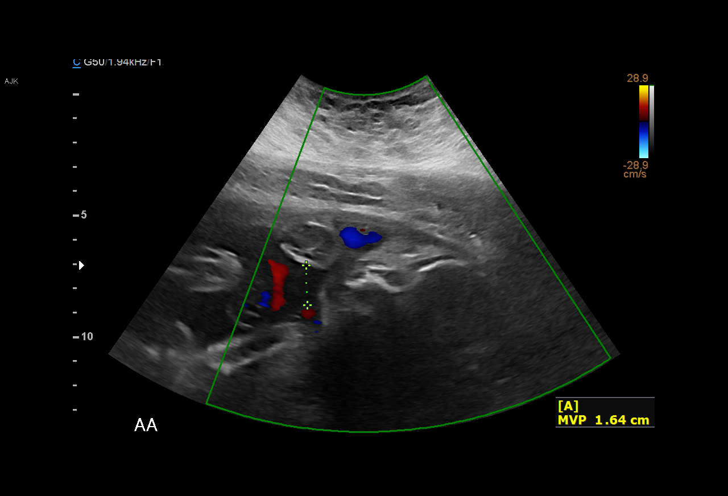
[im 10/28]
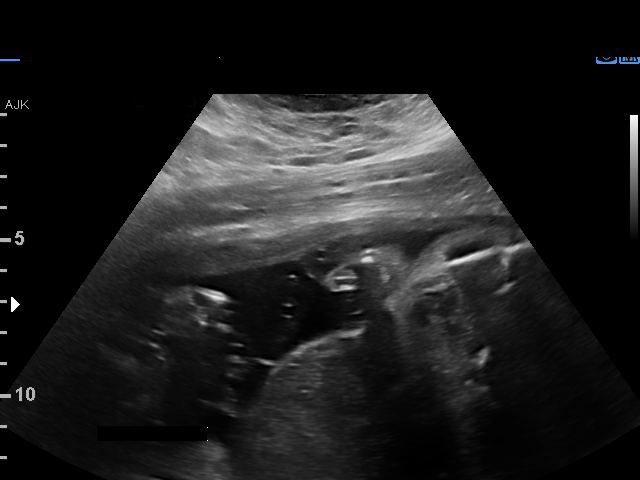
[im 12/28]
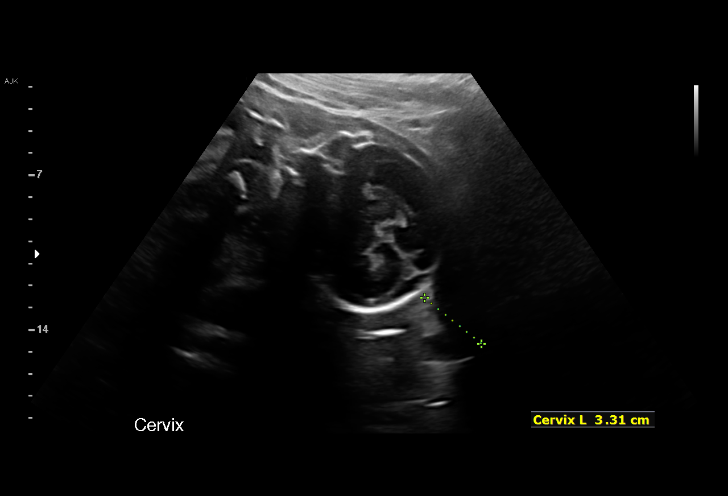
[im 14/28]
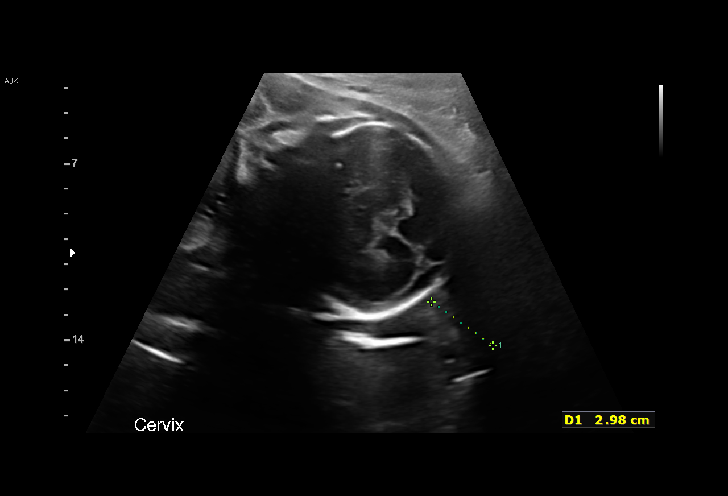
[im 16/28]
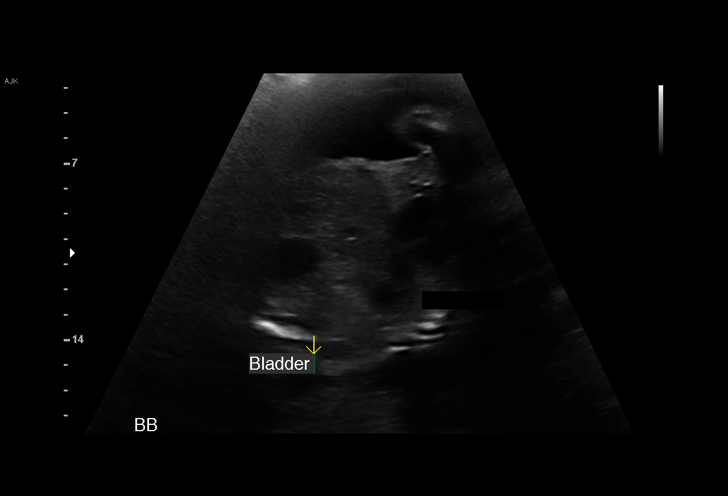
[im 18/28]
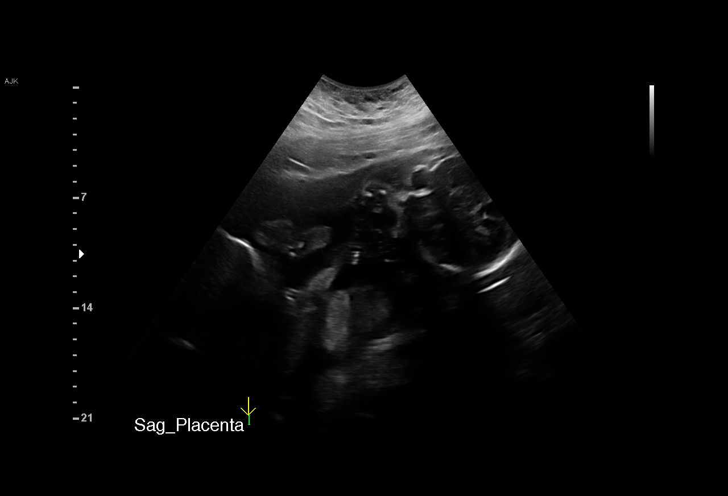
[im 20/28]
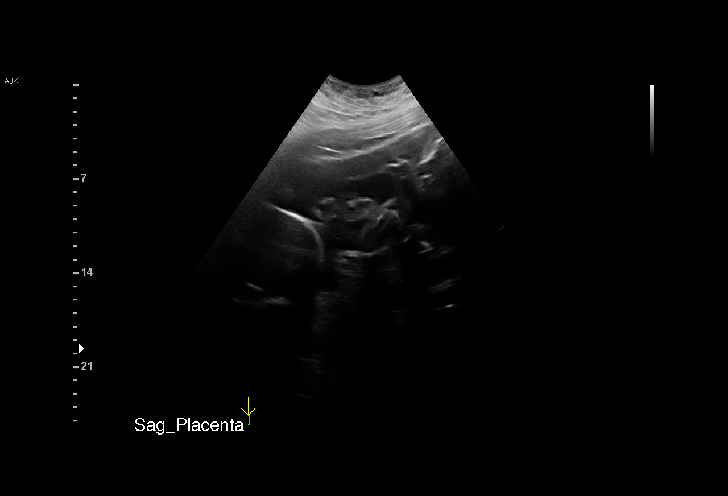
[im 22/28]
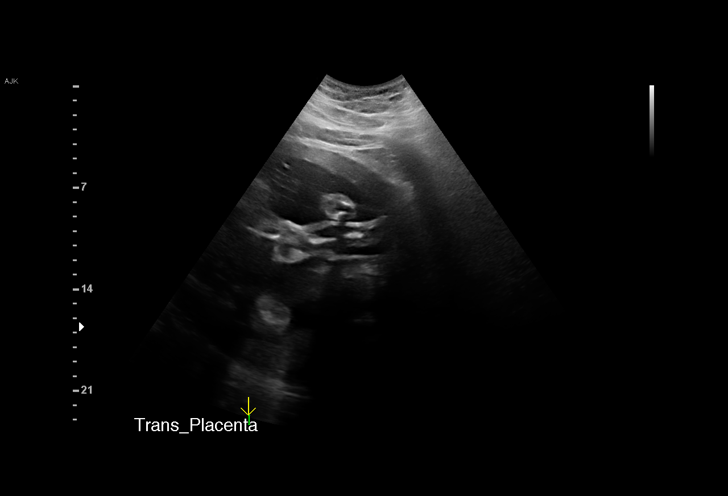
[im 24/28]
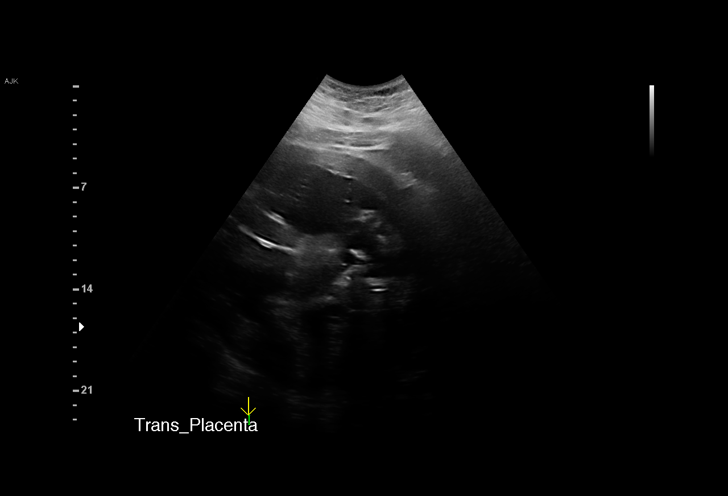
[im 26/28]
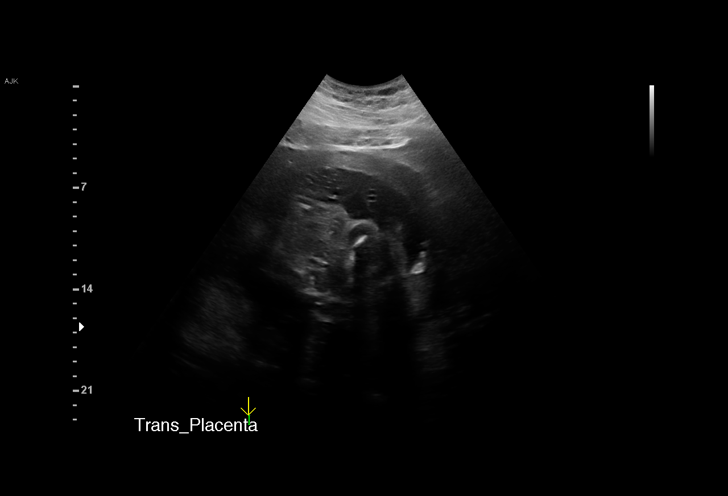
[im 28/28]
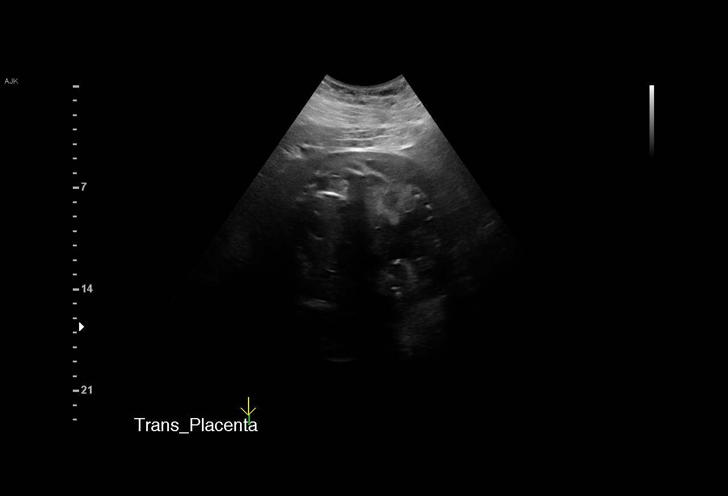

[14 of 28 positions shown; findings below may reference images not displayed]

[REDACTED]. [HOSPITAL]
                   DO

 1  US MFM OB LIMITED                     76815.01    [facility]

Indications

 26 weeks gestation of pregnancy
 Premature rupture of membranes - leaking
 fluid
 Twin pregnancy, [place]/Som, second trimester
 Obesity complicating pregnancy, second
 trimester (BMI 48)
 Hypertension - Chronic/Pre-existing
 (Labetalol)
 Advanced maternal age multigravida 35+,
 second trimester
 Pregnancy resulting from assisted
 reproductive technology (IVF)
 Genetic carrier (specify)
Fetal Evaluation (Fetus A)

 Num Of Fetuses:          2
 Fetal Heart Rate(bpm):   157
 Cardiac Activity:        Observed
 Fetal Lie:               Lower Fetus
 Presentation:            Cephalic
 Placenta:                Posterior
 P. Cord Insertion:       Not well visualized

 Amniotic Fluid
 AFI FV:      Oligohydramnios

                             Largest Pocket(cm)

OB History

 Gravidity:    1         Term:   0
 Living:       0
Gestational Age (Fetus A)

 LMP:           26w 4d        Date:  07/02/19                 EDD:   04/07/20
 Best:          26w 4d     Det. By:  LMP  (07/02/19)          EDD:   04/07/20

Fetal Evaluation (Fetus B)

 Num Of Fetuses:          2
 Fetal Heart Rate(bpm):   147
 Cardiac Activity:        Observed
 Fetal Lie:               Upper Fetus
 Presentation:            Transverse, head to maternal left
 Placenta:                Posterior
 P. Cord Insertion:       Not well visualized

 Amniotic Fluid
 AFI FV:      Within normal limits

                             Largest Pocket(cm)

Gestational Age (Fetus B)

 LMP:           26w 4d        Date:  07/02/19                 EDD:   04/07/20
 Best:          26w 4d     Det. By:  LMP  (07/02/19)          EDD:   04/07/20
Cervix Uterus Adnexa

 Cervix
 Length:              3  cm.
 Closed

 Uterus
 No abnormality visualized.

 Right Ovary
 Not visualized.

 Left Ovary
 Not visualized.

 Adnexa
 No abnormality visualized.
Impression

 Limited exam to assess diamniotic dichorionic twin pregnancy
 Suspected premature rupture of membranes of Twin A.
 Twin B has normal amniotic fluid volume.

 Known chronic hypertension with medication
 Obesity with an elevated BMI
Recommendations

 Management per [facility].

## 2021-08-30 IMAGING — US US MFM FETAL BPP W/O NON-STRESS
1 series · 12 of 28 positions shown · non-contrast
Comparison: none

[Series 1: us mfm fetal bpp w/o non-stress · 29 acquisitions, 12 frames shown]
[im 2/29]
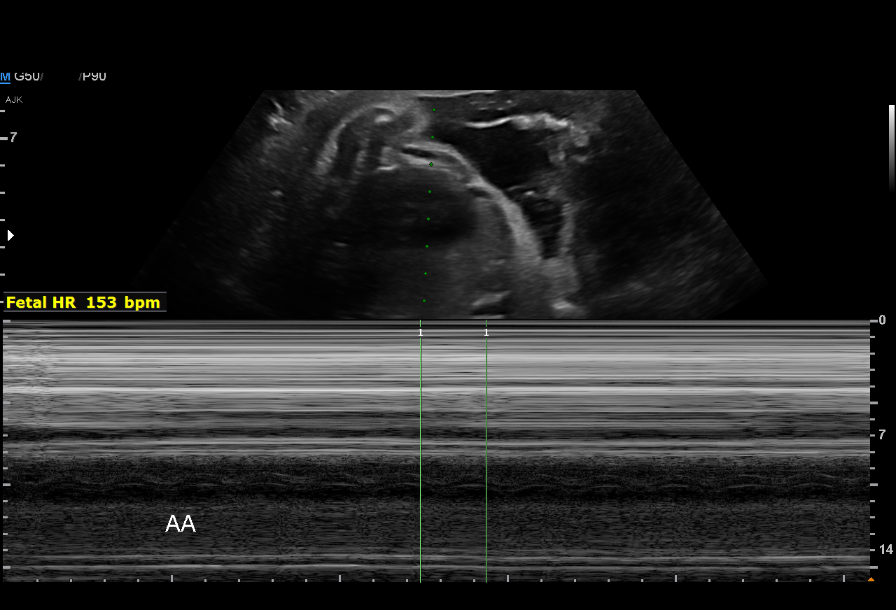
[im 4/29]
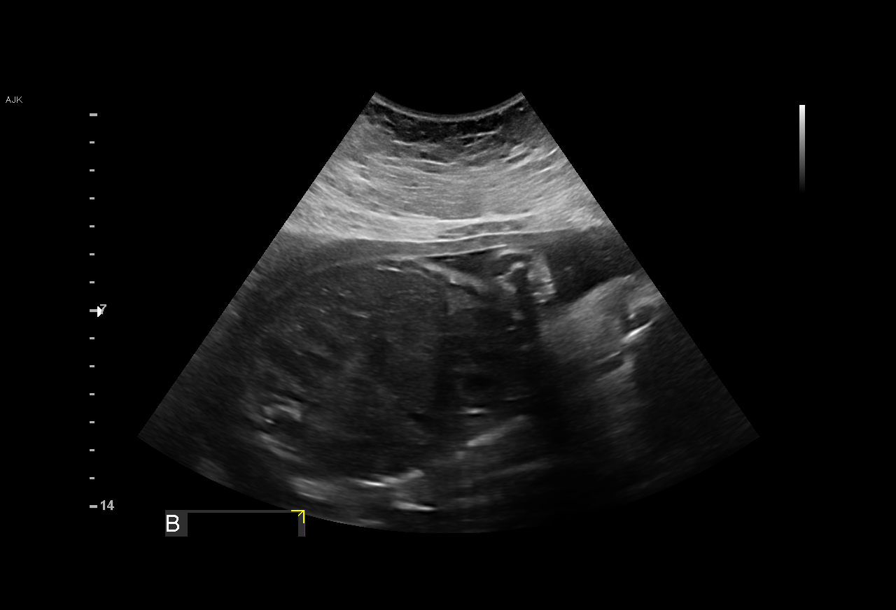
[im 6/29]
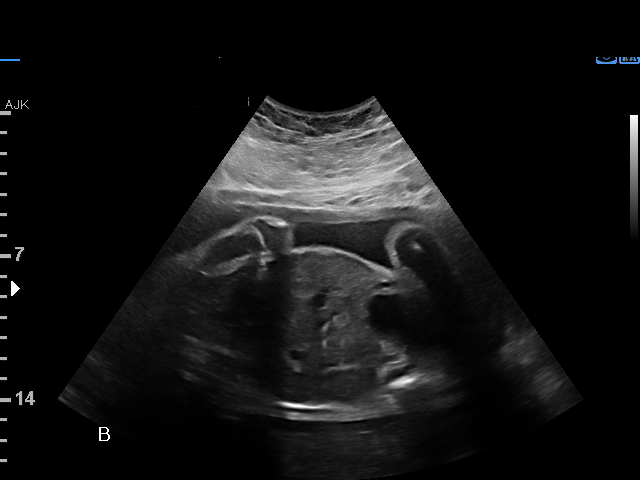
[im 9/29]
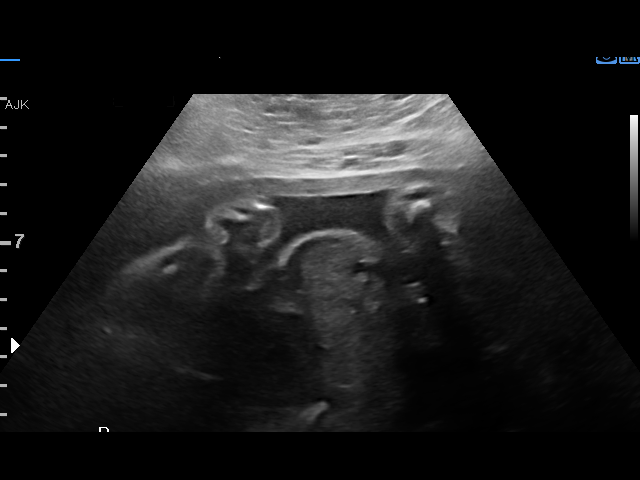
[im 11/29]
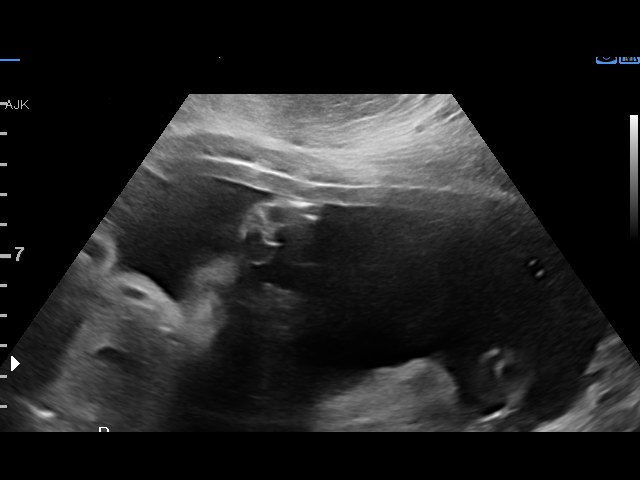
[im 13/29]
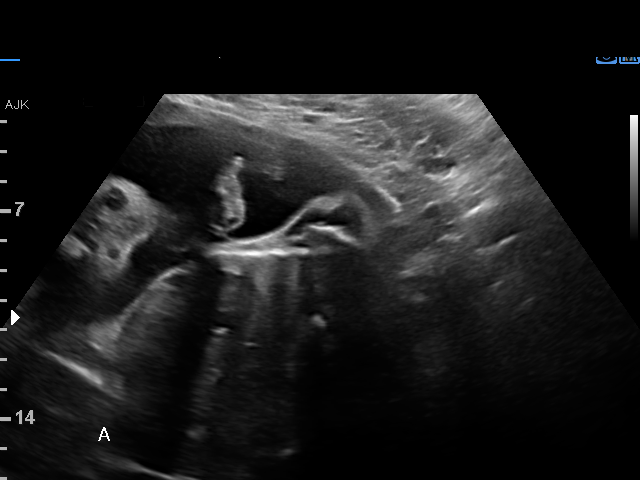
[im 16/29]
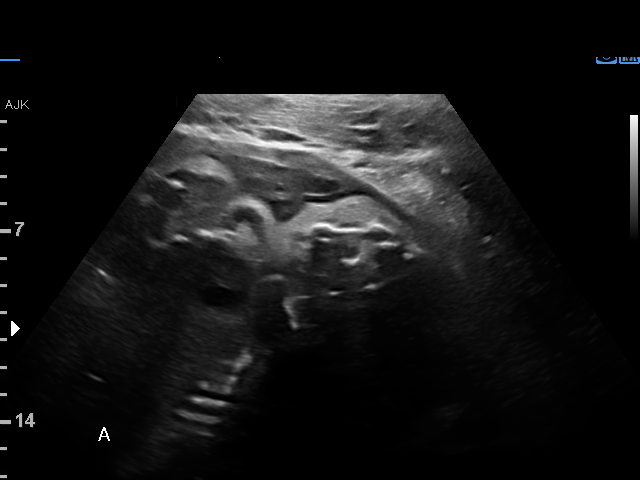
[im 18/29]
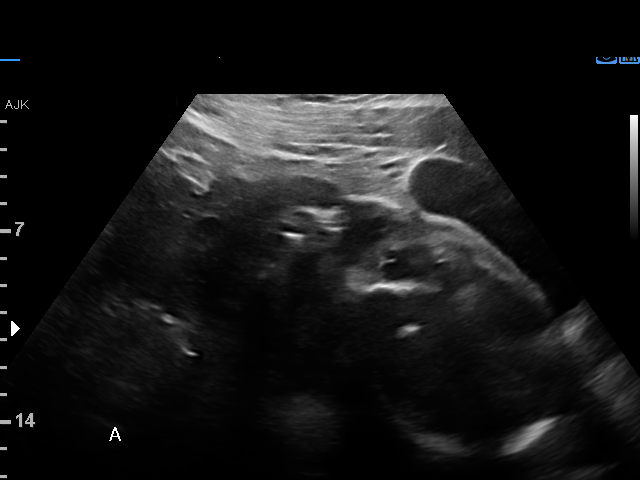
[im 20/29]
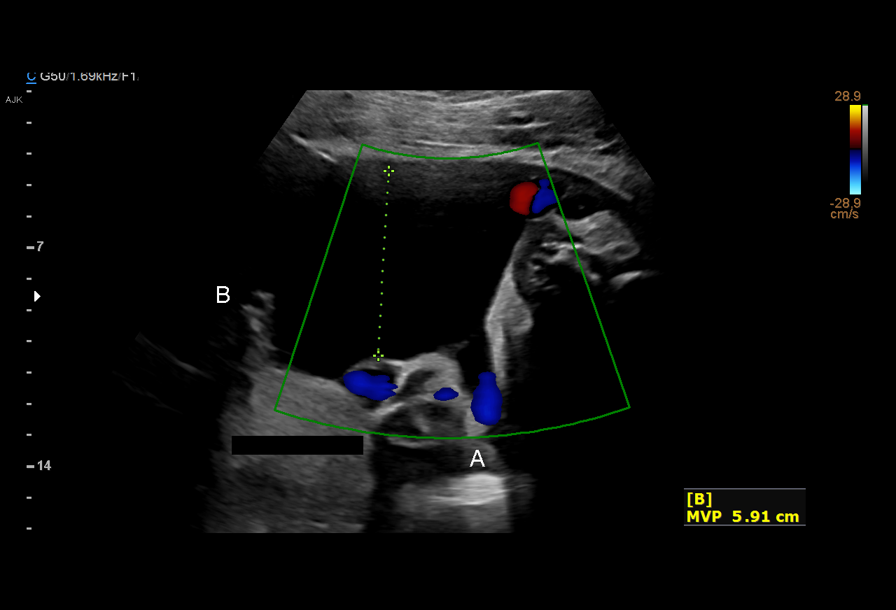
[im 23/29]
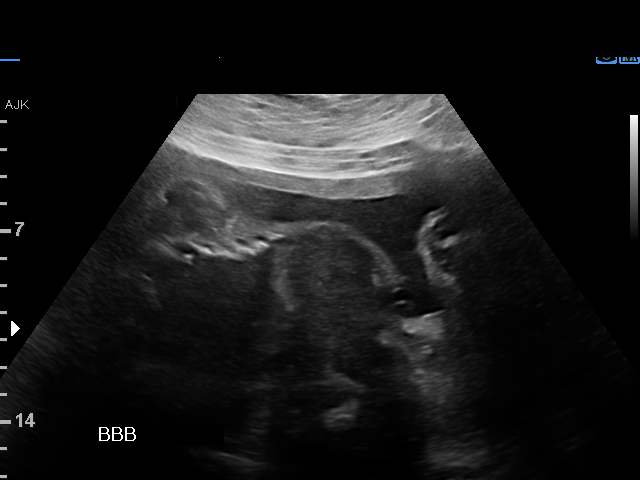
[im 25/29]
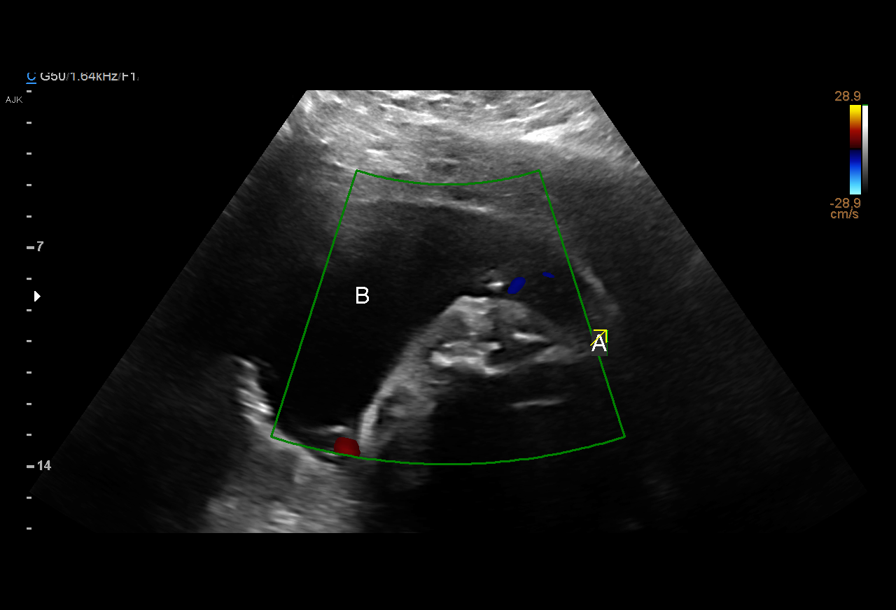
[im 27/29]
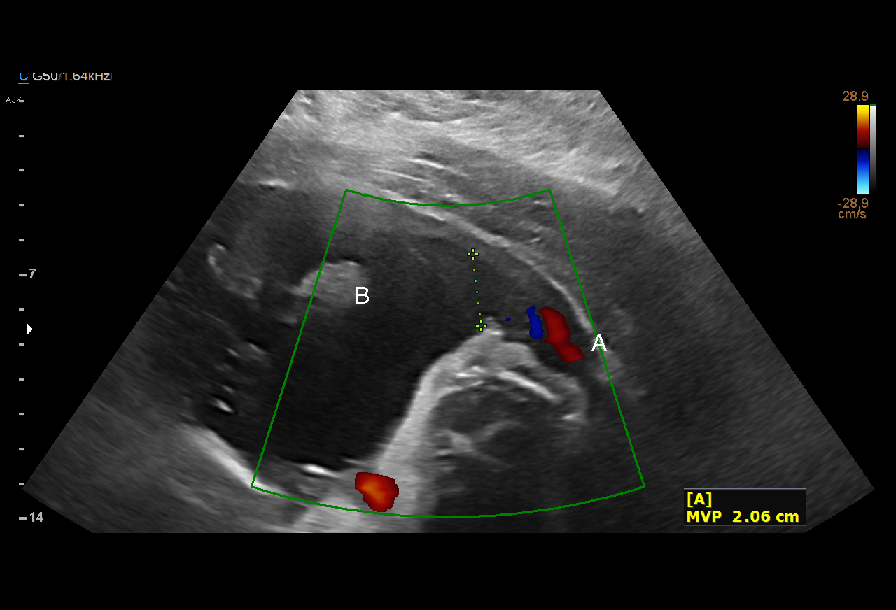

[12 of 28 positions shown; findings below may reference images not displayed]

[REDACTED]. [HOSPITAL]
                   DO

    ADDL GESTATION

Indications

 29 weeks gestation of pregnancy
 Non-reactive NST
 Premature rupture of membranes - leaking
 fluid
 Hypertension - Chronic/Pre-existing
 (Labetalol)
 Pregnancy resulting from assisted
 reproductive technology (IVF)
 Genetic carrier (specify)
 Twin pregnancy, Too/[place], third trimester
 Obesity complicating pregnancy
 Advanced maternal age multigravida 35+,
 third trimester
Fetal Evaluation (Fetus A)

 Num Of Fetuses:         2
 Fetal Heart Rate(bpm):  153
 Cardiac Activity:       Observed
 Fetal Lie:              Lower Fetus
 Presentation:           Cephalic
 Placenta:               Posterior

 Amniotic Fluid
 AFI FV:      Lower limit of normal

                             Largest Pocket(cm)

Biophysical Evaluation (Fetus A)

 Amniotic F.V:   Pocket => 2 cm             F. Tone:        Observed
 F. Movement:    Observed                   Score:          [DATE]
 F. Breathing:   Observed
OB History

 Gravidity:    1         Term:   0
 Living:       0
Gestational Age (Fetus A)

 LMP:           29w 1d        Date:  07/02/19                 EDD:   04/07/20
 Best:          29w 1d     Det. By:  LMP  (07/02/19)          EDD:   04/07/20

Fetal Evaluation (Fetus B)

 Num Of Fetuses:         2
 Fetal Heart Rate(bpm):  147
 Cardiac Activity:       Observed
 Fetal Lie:              Upper Fetus
 Presentation:           Variable
 Placenta:               Posterior

 Amniotic Fluid
 AFI FV:      Within normal limits

                             Largest Pocket(cm)

Biophysical Evaluation (Fetus B)

 Amniotic F.V:   Within normal limits       F. Tone:        Observed
 F. Movement:    Observed                   Score:          [DATE]
 F. Breathing:   Observed
Gestational Age (Fetus B)

 LMP:           29w 1d        Date:  07/02/19                 EDD:   04/07/20
 Best:          29w 1d     Det. By:  LMP  (07/02/19)          EDD:   04/07/20
Impression

 Monochorionic-diamniotic twin pregnancy.  Patient is
 admitted with diagnosis of PPROM.
 She has chronic hypertension and takes labetalol and
 Procardia for control.
 BPP was requested because of nonreactive NST.

 Twin A: Lower fetus, cephalic presentation, posterior
 placenta.  Amniotic fluid is in the lower limit of normal and
 good fetal activity is present. Antenatal testing is reassuring.
 BPP [DATE].

 Twin B: Upper fetus, variable presentation, posterior
 placenta.  Amniotic fluid is normal and good fetal activity
 seen.Antenatal testing is reassuring. BPP [DATE].
                 Nye, Semaj

## 2021-09-01 IMAGING — US US MFM FETAL BPP W/O NON-STRESS
1 series · 14 of 21 positions shown · non-contrast
Comparison: none

[Series 1: us mfm fetal bpp w/o non-stress · 21 acquisitions, 14 frames shown]
[im 1/21]
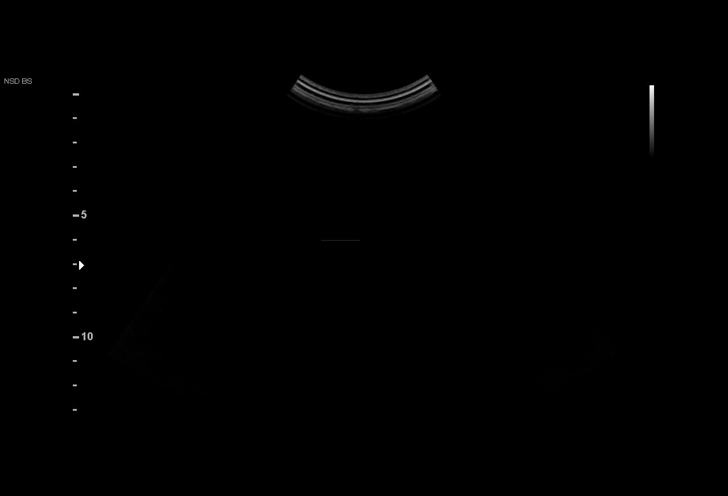
[im 3/21]
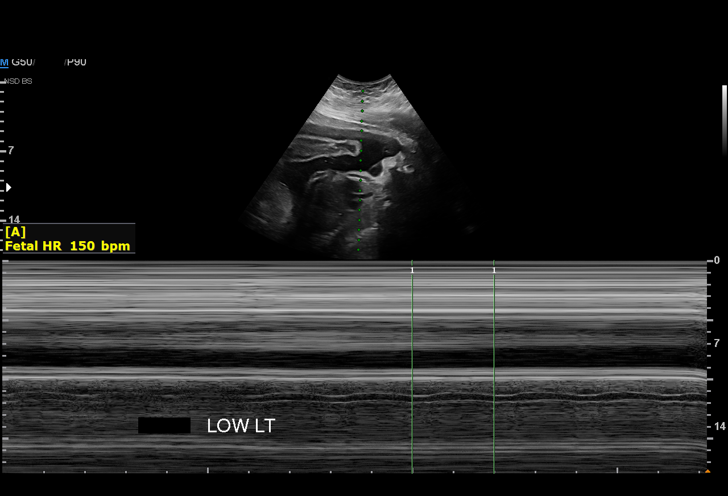
[im 4/21]
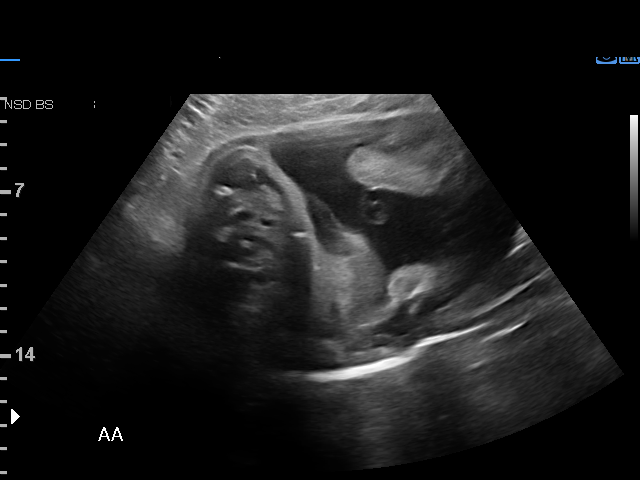
[im 6/21]
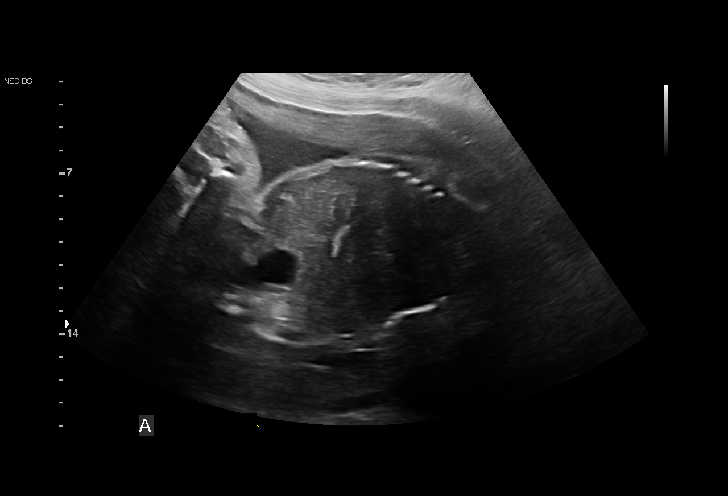
[im 7/21]
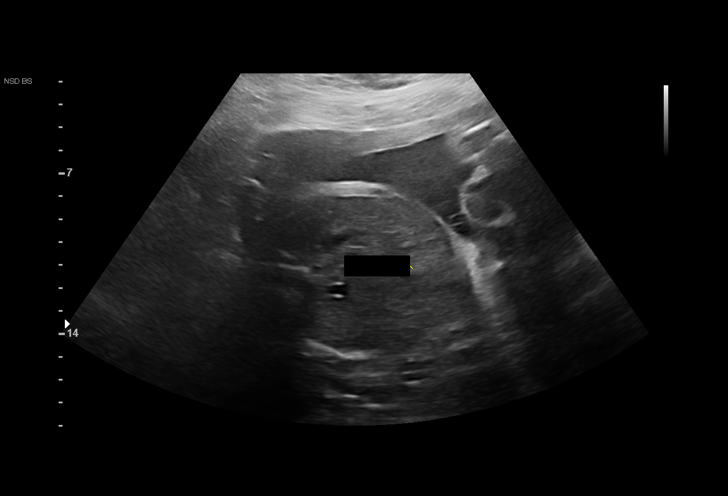
[im 9/21]
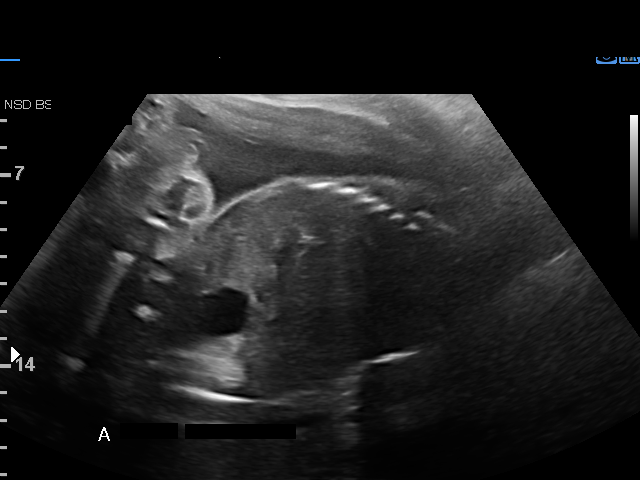
[im 10/21]
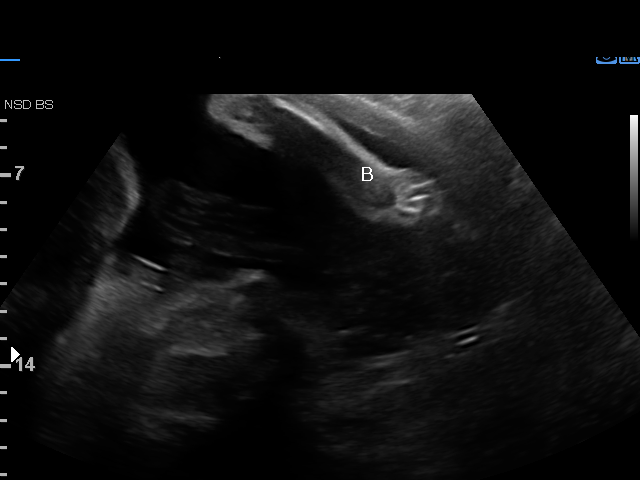
[im 12/21]
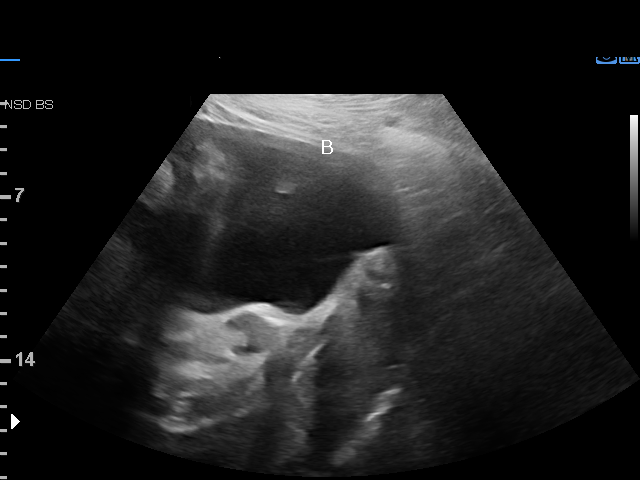
[im 13/21]
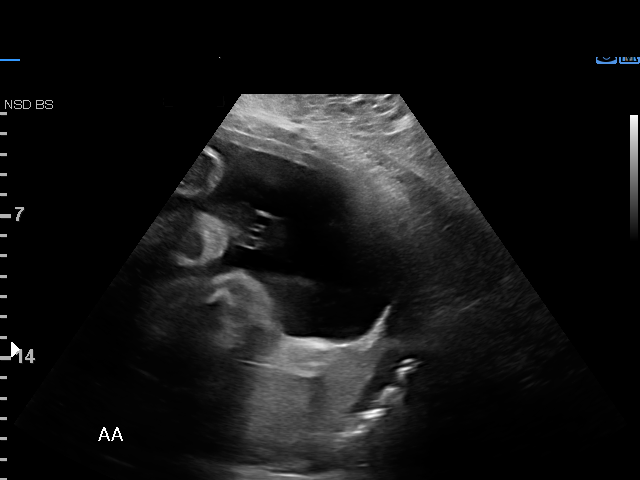
[im 15/21]
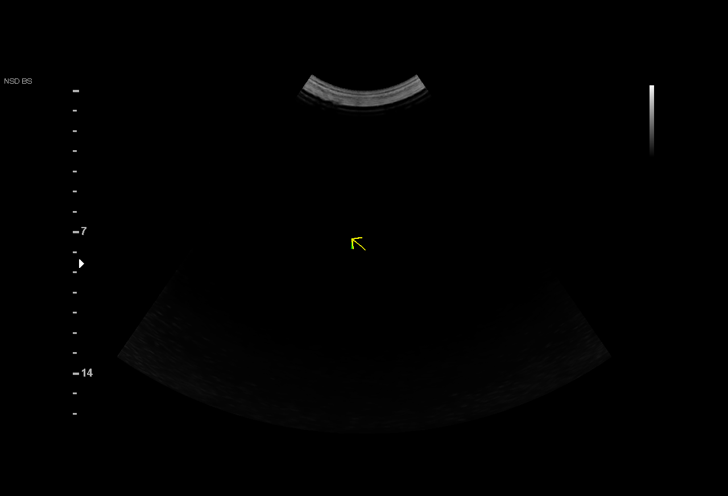
[im 16/21]
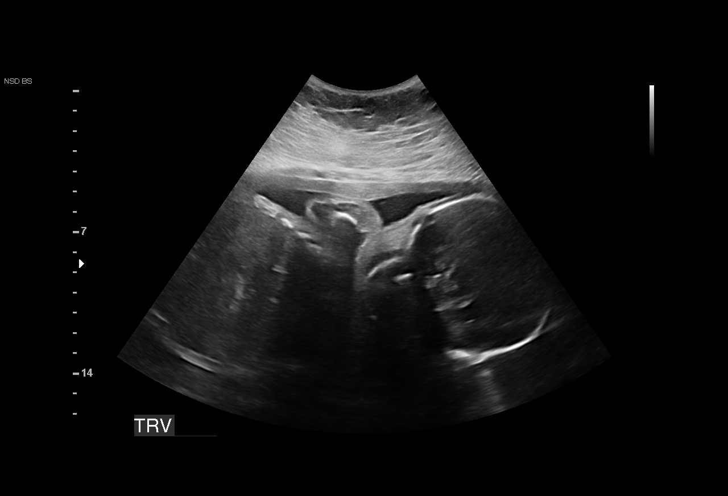
[im 18/21]
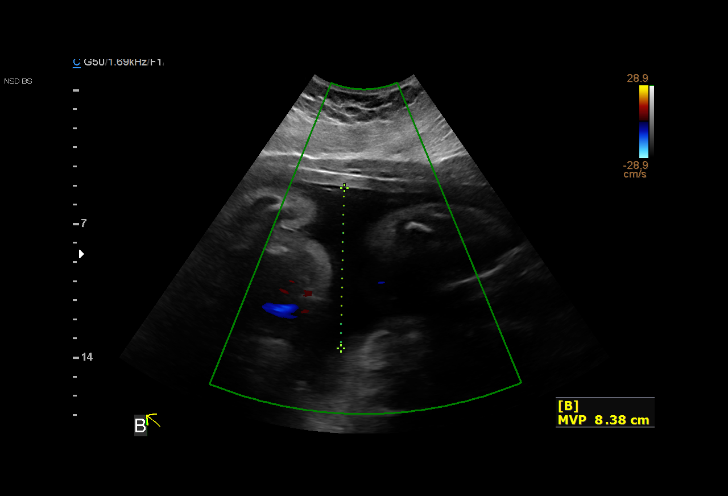
[im 19/21]
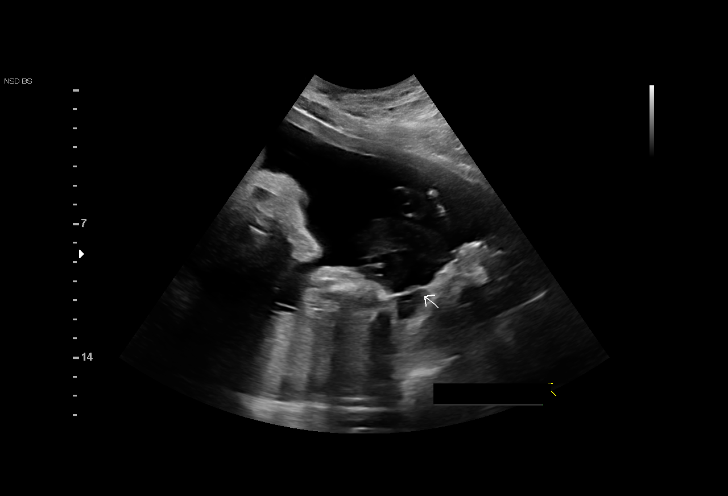
[im 21/21]
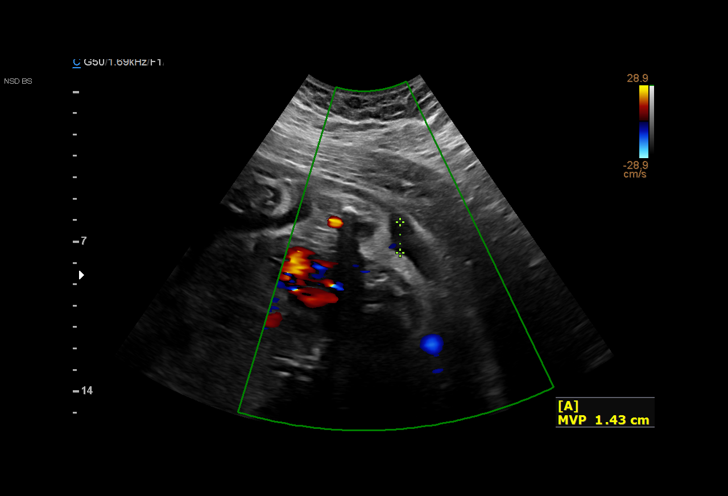

[14 of 21 positions shown; findings below may reference images not displayed]

[REDACTED]. [HOSPITAL]
                   DO

    ADDL GESTATION

Indications

 Non-reactive NST
 29 weeks gestation of pregnancy
 Premature rupture of membranes - leaking
 fluid
 Hypertension - Chronic/Pre-existing
 (Labetalol)
 Pregnancy resulting from assisted
 reproductive technology (IVF)
 Genetic carrier (specify)
 Twin pregnancy, Abdon/Flaco, third trimester
 Obesity complicating pregnancy
 Advanced maternal age multigravida 35+,
 third trimester
 Oligohydraminios, third trimester, fetus 1
 Repeat BPP
Fetal Evaluation (Fetus A)

 Num Of Fetuses:         2
 Fetal Heart Rate(bpm):  150
 Cardiac Activity:       Observed
 Fetal Lie:              Lower left side
 Presentation:           Cephalic
 Placenta:               Posterior

 Amniotic Fluid
 AFI FV:      Oligohydramnios

                             Largest Pocket(cm)

Biophysical Evaluation (Fetus A)

 Amniotic F.V:   Oligohydramnios            F. Tone:        Observed
 F. Movement:    Not Observed               Score:          [DATE]
 F. Breathing:   Observed
OB History

 Gravidity:    1         Term:   0
 Living:       0
Gestational Age (Fetus A)

 LMP:           29w 3d        Date:  07/02/19                 EDD:   04/07/20
 Best:          29w 3d     Det. By:  LMP  (07/02/19)          EDD:   04/07/20
Anatomy (Fetus A)

 Other:  Stomach and bladder noted.

Fetal Evaluation (Fetus B)

 Num Of Fetuses:         2
 Fetal Heart Rate(bpm):  141
 Cardiac Activity:       Observed
 Fetal Lie:              Upper Fetus
 Presentation:           Transverse, head to maternal left
 Placenta:               Posterior

 Amniotic Fluid
 AFI FV:      Within normal limits

                             Largest Pocket(cm)

Biophysical Evaluation (Fetus B)

 Amniotic F.V:   Within normal limits       F. Tone:        Observed
 F. Movement:    Observed                   Score:          [DATE]
 F. Breathing:   Observed
Gestational Age (Fetus B)

 LMP:           29w 3d        Date:  07/02/19                 EDD:   04/07/20
 Best:          29w 3d     Det. By:  LMP  (07/02/19)          EDD:   04/07/20
Impression

 Monochorionic-diamniotic twin pregnancy.

 BPP was requested for nonreactive NST.

 Twin A: Lower fetus, cephalic presentation, posterior
 placenta. Oligohydramnios with deepest vertical pocket
 measuring 1.4 cm is seen. Fetal breathing movements met
 the criteria of BPP. Fetal movements did not meet the criteria.

 Twin B: Upper fetus, transverse lie, and head to maternal left,
 posterior placenta. Amniotic fluid is normal and good fetal
 activity is seen. Antenatal testing is reassuring. BPP [DATE].
 NST in twin A has been consistently nonreactive with
 occasional variable decelerations. BPP is [DATE] including NST.
 Delivery at 29+ weeks carries a higher risk of prematurity
 complications. Poor BPP score carries a higher risk of
 perinatal mortality. In monochorionic-diamniotic twin
 pregnancy, fetal death of one twin carries a higher risk of co-
 twin demise or neurological complications in the surviving
 twin (up to 25%).
 BPP has limitations in predicting fetal compromise. But, given
 that we have 2 sets of BPP scores of [DATE], I recommend
 delivery. A non-urgent delivery tomorrow is reasonable.
 I recommend continuous NST monitoring now till delivery.
 Patient had received steroids earlier,  I recommend
 magnesium sulfate infusion for neuroprotection before
 delivery.
 I discussed the findings and recommendations with Dr. Ferienhaus.
                 Fidele, [place]

## 2021-09-22 ENCOUNTER — Other Ambulatory Visit (HOSPITAL_COMMUNITY): Payer: Self-pay

## 2021-10-09 ENCOUNTER — Encounter: Payer: Self-pay | Admitting: Nurse Practitioner

## 2021-11-21 ENCOUNTER — Telehealth: Payer: BC Managed Care – PPO | Admitting: Physician Assistant

## 2021-11-21 DIAGNOSIS — J209 Acute bronchitis, unspecified: Secondary | ICD-10-CM | POA: Diagnosis not present

## 2021-11-21 DIAGNOSIS — B9689 Other specified bacterial agents as the cause of diseases classified elsewhere: Secondary | ICD-10-CM | POA: Diagnosis not present

## 2021-11-21 DIAGNOSIS — J019 Acute sinusitis, unspecified: Secondary | ICD-10-CM | POA: Diagnosis not present

## 2021-11-21 MED ORDER — BENZONATATE 100 MG PO CAPS: 100.0000 mg | ORAL_CAPSULE | Freq: Three times a day (TID) | ORAL | 0 refills | Status: DC | PRN

## 2021-11-21 MED ORDER — DOXYCYCLINE HYCLATE 100 MG PO TABS: 100.0000 mg | ORAL_TABLET | Freq: Two times a day (BID) | ORAL | 0 refills | Status: DC

## 2021-11-21 NOTE — Progress Notes (Signed)
I have spent 5 minutes in review of e-visit questionnaire, review and updating patient chart, medical decision making and response to patient.   Cresta Riden Cody Audine Mangione, PA-C    

## 2021-11-21 NOTE — Progress Notes (Signed)
E-Visit for Sinus Problems  We are sorry that you are not feeling well.  Here is how we plan to help!  Based on what you have shared with me it looks like you have sinusitis.  Sinusitis is inflammation and infection in the sinus cavities of the head.  Based on your presentation I believe you most likely have Acute Bacterial Sinusitis.  This is an infection caused by bacteria and is treated with antibiotics. I have prescribed Doxycycline '100mg'$  by mouth twice a day for 10 days. You may use an oral decongestant such as Mucinex D or if you have glaucoma or high blood pressure use plain Mucinex. Saline nasal spray help and can safely be used as often as needed for congestion.  If you develop worsening sinus pain, fever or notice severe headache and vision changes, or if symptoms are not better after completion of antibiotic, please schedule an appointment with a health care provider.    I have also sent in a prescription cough medication as well.   Sinus infections are not as easily transmitted as other respiratory infection, however we still recommend that you avoid close contact with loved ones, especially the very young and elderly.  Remember to wash your hands thoroughly throughout the day as this is the number one way to prevent the spread of infection!  Home Care: Only take medications as instructed by your medical team. Complete the entire course of an antibiotic. Do not take these medications with alcohol. A steam or ultrasonic humidifier can help congestion.  You can place a towel over your head and breathe in the steam from hot water coming from a faucet. Avoid close contacts especially the very young and the elderly. Cover your mouth when you cough or sneeze. Always remember to wash your hands.  Get Help Right Away If: You develop worsening fever or sinus pain. You develop a severe head ache or visual changes. Your symptoms persist after you have completed your treatment plan.  Make  sure you Understand these instructions. Will watch your condition. Will get help right away if you are not doing well or get worse.  Thank you for choosing an e-visit.  Your e-visit answers were reviewed by a board certified advanced clinical practitioner to complete your personal care plan. Depending upon the condition, your plan could have included both over the counter or prescription medications.  Please review your pharmacy choice. Make sure the pharmacy is open so you can pick up prescription now. If there is a problem, you may contact your provider through [employer] and have the prescription routed to another pharmacy.  Your safety is important to [place]. If you have drug allergies check your prescription carefully.   For the next 24 hours you can use MyChart to ask questions about today's visit, request a non-urgent call back, or ask for a work or school excuse. You will get an email in the next two days asking about your experience. I hope that your e-visit has been valuable and will speed your recovery.

## 2021-12-19 ENCOUNTER — Other Ambulatory Visit (HOSPITAL_COMMUNITY): Payer: Self-pay

## 2021-12-26 ENCOUNTER — Encounter: Payer: BC Managed Care – PPO | Admitting: Nurse Practitioner

## 2022-02-23 ENCOUNTER — Ambulatory Visit (INDEPENDENT_AMBULATORY_CARE_PROVIDER_SITE_OTHER): Payer: 59 | Admitting: Nurse Practitioner

## 2022-02-23 ENCOUNTER — Encounter: Payer: Self-pay | Admitting: Nurse Practitioner

## 2022-02-23 ENCOUNTER — Other Ambulatory Visit (HOSPITAL_COMMUNITY): Payer: Self-pay

## 2022-02-23 VITALS — BP 120/82 | HR 76 | Temp 98.6°F | Resp 16 | Ht 67.0 in | Wt 346.0 lb

## 2022-02-23 DIAGNOSIS — Z0001 Encounter for general adult medical examination with abnormal findings: Secondary | ICD-10-CM

## 2022-02-23 DIAGNOSIS — E785 Hyperlipidemia, unspecified: Secondary | ICD-10-CM | POA: Diagnosis not present

## 2022-02-23 DIAGNOSIS — I10 Essential (primary) hypertension: Secondary | ICD-10-CM | POA: Diagnosis not present

## 2022-02-23 DIAGNOSIS — E1169 Type 2 diabetes mellitus with other specified complication: Secondary | ICD-10-CM | POA: Diagnosis not present

## 2022-02-23 DIAGNOSIS — F331 Major depressive disorder, recurrent, moderate: Secondary | ICD-10-CM | POA: Diagnosis not present

## 2022-02-23 DIAGNOSIS — E559 Vitamin D deficiency, unspecified: Secondary | ICD-10-CM

## 2022-02-23 DIAGNOSIS — E119 Type 2 diabetes mellitus without complications: Secondary | ICD-10-CM

## 2022-02-23 LAB — COMPREHENSIVE METABOLIC PANEL
ALT: 14 U/L (ref 0–35)
AST: 12 U/L (ref 0–37)
Albumin: 3.9 g/dL (ref 3.5–5.2)
Alkaline Phosphatase: 47 U/L (ref 39–117)
BUN: 13 mg/dL (ref 6–23)
CO2: 27 mEq/L (ref 19–32)
Calcium: 8.8 mg/dL (ref 8.4–10.5)
Chloride: 103 mEq/L (ref 96–112)
Creatinine, Ser: 0.83 mg/dL (ref 0.40–1.20)
GFR: 89.89 mL/min (ref >60.00)
Glucose, Bld: 87 mg/dL (ref 70–99)
Potassium: 4 mEq/L (ref 3.5–5.1)
Sodium: 141 mEq/L (ref 135–145)
Total Bilirubin: 0.3 mg/dL (ref 0.2–1.2)
Total Protein: 6.6 g/dL (ref 6.0–8.3)

## 2022-02-23 LAB — LIPID PANEL
Cholesterol: 161 mg/dL (ref 0–200)
HDL: 35.1 mg/dL — ABNORMAL LOW (ref >39.00)
LDL Cholesterol: 103 mg/dL — ABNORMAL HIGH (ref 0–99)
NonHDL: 125.44
Total CHOL/HDL Ratio: 5
Triglycerides: 114 mg/dL (ref 0.0–149.0)
VLDL: 22.8 mg/dL (ref 0.0–40.0)

## 2022-02-23 LAB — TSH: TSH: 2.84 u[IU]/mL (ref 0.35–5.50)

## 2022-02-23 LAB — HEMOGLOBIN A1C: Hgb A1c MFr Bld: 6.7 % — ABNORMAL HIGH (ref 4.6–6.5)

## 2022-02-23 LAB — VITAMIN D 25 HYDROXY (VIT D DEFICIENCY, FRACTURES): VITD: 8.42 ng/mL — ABNORMAL LOW (ref 30.00–100.00)

## 2022-02-23 MED ORDER — METOPROLOL SUCCINATE ER 100 MG PO TB24
100.0000 mg | ORAL_TABLET | Freq: Every day | ORAL | 3 refills | Status: DC
  Filled 2022-02-23 – 2022-03-21 (×2): qty 90, 90d supply, fill #0
  Filled 2022-07-16: qty 90, 90d supply, fill #1
  Filled 2022-11-15: qty 90, 90d supply, fill #2

## 2022-02-23 MED ORDER — AMLODIPINE BESYLATE 10 MG PO TABS
10.0000 mg | ORAL_TABLET | Freq: Every day | ORAL | 3 refills | Status: DC
  Filled 2022-02-23 – 2022-03-26 (×3): qty 90, 90d supply, fill #0
  Filled 2022-07-16: qty 90, 90d supply, fill #1
  Filled 2022-11-15: qty 90, 90d supply, fill #2

## 2022-02-23 MED ORDER — TIRZEPATIDE 2.5 MG/0.5ML ~~LOC~~ SOAJ
2.5000 mg | SUBCUTANEOUS | 1 refills | Status: DC
  Filled 2022-02-23: qty 2, 28d supply, fill #0
  Filled 2022-03-21: qty 2, 28d supply, fill #1

## 2022-02-23 MED ORDER — BUPROPION HCL 75 MG PO TABS
75.0000 mg | ORAL_TABLET | Freq: Two times a day (BID) | ORAL | 5 refills | Status: DC
  Filled 2022-02-23: qty 60, 30d supply, fill #0

## 2022-02-23 NOTE — Assessment & Plan Note (Signed)
Ozempic was discontinued by patient due to lack of insurance coverage. She now has insurance and will like to switch to [employer]. She denies any adverse effects with ozempic in the past. Normal urine microalbumin Normal foot exam today Advised to schedule appt with ophthalmology for annual eye exam. BP at goal  Repeat lipid panel, CMP, and hgb A1c Sent mounjaro 2.20m F/up in 186month

## 2022-02-23 NOTE — Assessment & Plan Note (Signed)
BP at goal with metoprolol and metoprolol BP Readings from Last 3 Encounters:  02/23/22 120/82  05/04/21 122/80  02/17/21 128/80    Maintain med doses  Refills sent

## 2022-02-23 NOTE — Assessment & Plan Note (Signed)
Worsening mood without use of wellbutrin. Medication discontinued due to lack of insurance. She agreed to psychology referral Wellbutrin 60m BID sent F/up in 150month

## 2022-02-23 NOTE — Progress Notes (Signed)
Complete physical exam  Patient: Cheryl Ayala   DOB: April 27, 1985   37 y.o. Female  MRN: [id number] Visit Date: 02/23/2022  Subjective:    Chief Complaint  Patient presents with   Acute Visit    Cpe, Medication change for anti-depressant, monjaro.  Letter for weight loss surgery    Cheryl Ayala is a 37 y.o. female who presents today for a complete physical exam. She reports consuming a general diet.  No exercise regimen  She generally feels well. She reports sleeping well. She does have additional problems to discuss today.  Vision:No Dental:Yes STD Screen:No  Most recent fall risk assessment:    02/23/2022   12:54 PM  [place] in the past year? 0  Number falls in past yr: 0  Injury with Fall? 0   Depression screen:Yes - Depression  Most recent depression screenings:    02/23/2022    1:35 PM 05/04/2021    8:33 AM  PHQ 2/9 Scores  PHQ - 2 Score 2 3  PHQ- 9 Score 8 9   HPI  Type 2 diabetes mellitus with hyperglycemia, with long-term current use of insulin ([place]) Ozempic was discontinued by patient due to lack of insurance coverage. She now has insurance and will like to switch to [employer]. She denies any adverse effects with ozempic in the past. Normal urine microalbumin Normal foot exam today Advised to schedule appt with ophthalmology for annual eye exam. BP at goal  Repeat lipid panel, CMP, and hgb A1c Sent mounjaro 2.37m F/up in 137monthBenign essential hypertension BP at goal with metoprolol and metoprolol BP Readings from Last 3 Encounters:  02/23/22 120/82  05/04/21 122/80  02/17/21 128/80    Maintain med doses  Refills sent  Morbid obesity ([place]ues to struggle with stress eating and low energy. Reports increased appetite and fatigue exacerbated by uncontrolled depression and financial strain. She was able to loss 10lbs last year with GLP-1 injection. Will resume this. She plans to consult with bariatric clinic for gastric surgery. She  has not participated in any previous weight loss program. Hx of type 2 DM, hyperlipidemia, and HTN Wt Readings from Last 3 Encounters:  02/23/22 (!) 346 lb (156.9 kg)  05/04/21 (!) 311 lb 6.4 oz (141.3 kg)  07/08/20 (!) 323 lb (146.5 kg)    Advised about importance of daily exercise: start with low impact exercise 15-2055m daily (walking in her neighborhood or exercise video at home). Advised to cook meals at home and how to measure appropriate meal portions. Check vit, D. Tsh, hgbA1c, lipid panel, CMP start mounjaro 2.5mg35mup in 1mon650montherate episode of recurrent major depressive disorder (HCC) Worsening mood without use of wellbutrin. Medication discontinued due to lack of insurance. She agreed to psychology referral Wellbutrin 75mg 65msent F/up in 50month150month Medical History:  Diagnosis Date   Anovulation 05/19/2015   Anxiety    Esophageal dysphagia    GERD (gastroesophageal reflux disease)    Hypersomnia 12/23/2015   Hypertension    Plantar fasciitis    Preterm premature rupture of membranes 01/05/2020   Venous insufficiency    Past Surgical History:  Procedure Laterality Date   CESAREAN SECTION MULTI-GESTATIONAL N/A 01/25/2020   Procedure: CESAREAN SECTION MULTI-GESTATIONAL;  Surgeon: Morris,Linda HedgesLocation: [facility];  Service: Obstetrics;  Laterality: N/A;   CHOLECYSTECTOMY, LAPAROSCOPIC  04/09/2019   ESOPHAGEAL MANOMETRY N/A 09/25/2017   Procedure: ESOPHAGEAL MANOMETRY (EM);  Surgeon: NandigaHarl Bowie  V, MD;  Location: [facility];  Service: Endoscopy;  Laterality: N/A;   IVF     PLANTAR FASCIA RELEASE Bilateral    2019 and 2020   WISDOM TOOTH EXTRACTION  2013   Social History   Socioeconomic History   Marital status: Married    Spouse name: Not on file   Number of children: Not on file   Years of education: Not on file   Highest education level: Not on file  Occupational History   Not on file  Tobacco Use   Smoking status: Never    Smokeless tobacco: Never  Vaping Use   Vaping Use: Never used  Substance and Sexual Activity   Alcohol use: Yes    Comment: once every 6 months    Drug use: Never   Sexual activity: Yes  Other Topics Concern   Not on file  Social History Narrative   Not on file   Social Determinants of Health   Financial Resource Strain: Not on file  Food Insecurity: Not on file  Transportation Needs: Not on file  Physical Activity: Not on file  Stress: Not on file  Social Connections: Not on file  Intimate Partner Violence: Not on file   Family Status  Relation Name Status   Mother  (Not Specified)   Father  (Not Specified)   Sister  (Not Specified)   MGM  (Not Specified)   MGF  (Not Specified)   PGF  (Not Specified)   Family History  Problem Relation Age of Onset   Cancer Mother    Hypertension Father    Hyperlipidemia Father    Anxiety disorder Father    Depression Father    Heart disease Father    AAA (abdominal aortic aneurysm) Father    Depression Sister    Anxiety disorder Sister    Heart disease Maternal Grandmother    Alzheimer's disease Maternal Grandmother    Stroke Maternal Grandmother    Gout Maternal Grandfather    Heart disease Paternal Grandfather    Allergies  Allergen Reactions   Penicillins Rash    Has patient had a PCN reaction causing immediate rash, facial/tongue/throat swelling, SOB or lightheadedness with hypotension: Yes Has patient had a PCN reaction causing severe rash involving mucus membranes or skin necrosis: No Has patient had a PCN reaction that required hospitalization: No Has patient had a PCN reaction occurring within the last 10 years: No If all of the above answers are "NO", then may proceed with Cephalosporin use.    Sulfa Antibiotics Rash    Patient Care Team: Erandy Mceachern, Charlene Brooke, NP as PCP - General (Internal Medicine)   Medications: Outpatient Medications Prior to Visit  Medication Sig   blood glucose meter kit and supplies  Dispense based on patient and insurance preference. Use once a day before breakfast. ICD10:E11.65   Blood Glucose Monitoring Suppl (FREESTYLE LITE) w/Device KIT Use to directed to test blood sugar once a day befor breakfast   glucose blood test strip Use as directed to check blood sugar once a day before breakfast   Lancets (FREESTYLE) lancets Use as directed to check blood sugar once a day before breakfast   pantoprazole (PROTONIX) 40 MG tablet Take 1 tablet (40 mg total) by mouth daily.   [DISCONTINUED] amLODipine (NORVASC) 10 MG tablet Take 1 tablet (10 mg total) by mouth daily.   [DISCONTINUED] metoprolol succinate (TOPROL-XL) 100 MG 24 hr tablet Take 1 tablet (100 mg total) by mouth daily. Take with or immediately  following a meal.   omeprazole (PRILOSEC) 40 MG capsule TAKE 1 CAPSULE BY MOUTH 2 TIMES DAILY   [DISCONTINUED] benzonatate (TESSALON) 100 MG capsule Take 1 capsule (100 mg total) by mouth 3 (three) times daily as needed for cough.   [DISCONTINUED] buPROPion (WELLBUTRIN) 75 MG tablet Take 1 tablet (75 mg total) by mouth 2 (two) times daily.   [DISCONTINUED] dextroamphetamine (DEXEDRINE SPANSULE) 15 MG 24 hr capsule Take 1 capsule by mouth daily. (Patient not taking: Reported on 02/23/2022)   [DISCONTINUED] doxycycline (VIBRA-TABS) 100 MG tablet Take 1 tablet (100 mg total) by mouth 2 (two) times daily.   [DISCONTINUED] Semaglutide,0.25 or 0.5MG/DOS, (OZEMPIC, 0.25 OR 0.5 MG/DOSE,) 2 MG/1.5ML SOPN 0.25ng weekly x 2weeks then 0.51m weekly continuously   No facility-administered medications prior to visit.    Review of Systems  Constitutional:  Negative for fever.  HENT:  Negative for congestion and sore throat.   Eyes:        Negative for visual changes  Respiratory:  Negative for cough and shortness of breath.   Cardiovascular:  Negative for chest pain, palpitations and leg swelling.  Gastrointestinal:  Negative for blood in stool, constipation and diarrhea.  Genitourinary:   Negative for dysuria, frequency and urgency.  Musculoskeletal:  Negative for myalgias.  Skin:  Negative for rash.  Neurological:  Negative for dizziness and headaches.  Hematological:  Does not bruise/bleed easily.  Psychiatric/Behavioral:  Negative for suicidal ideas. The patient is not nervous/anxious.         Objective:  BP 120/82 (BP Location: Left Arm, Patient Position: Sitting, Cuff Size: Large)   Pulse 76   Temp 98.6 F (37 C) (Temporal)   Resp 16   Ht 5' 7"$  (1.702 m)   Wt (!) 346 lb (156.9 kg)   LMP 01/28/2022 (Approximate)   SpO2 97%   BMI 54.19 kg/m     Physical Exam Constitutional:      General: She is not in acute distress.    Appearance: She is obese.  HENT:     Right Ear: Tympanic membrane, ear canal and external ear normal.     Left Ear: Tympanic membrane, ear canal and external ear normal.     Nose: Nose normal.     Mouth/Throat:     Pharynx: No oropharyngeal exudate.  Eyes:     General: No scleral icterus.    Extraocular Movements: Extraocular movements intact.     Conjunctiva/sclera: Conjunctivae normal.     Pupils: Pupils are equal, round, and reactive to light.  Cardiovascular:     Rate and Rhythm: Normal rate and regular rhythm.     Pulses: Normal pulses.     Heart sounds: Normal heart sounds.  Pulmonary:     Effort: Pulmonary effort is normal. No respiratory distress.     Breath sounds: Normal breath sounds.  Abdominal:     General: Bowel sounds are normal. There is no distension.     Palpations: Abdomen is soft.  Genitourinary:    Comments: Deferred breast and pelvic exam to GYN Musculoskeletal:        General: Normal range of motion.     Cervical back: Normal range of motion and neck supple.     Right lower leg: Edema present.     Left lower leg: Edema present.  Lymphadenopathy:     Cervical: No cervical adenopathy.  Skin:    General: Skin is warm and dry.     Findings: No erythema.  Neurological:  Mental Status: She is alert  and oriented to person, place, and time.  Psychiatric:        Mood and Affect: Mood normal.        Behavior: Behavior normal.        Thought Content: Thought content normal.     No results found for any visits on 02/23/22.    Assessment & Plan:    Routine Health Maintenance and Physical Exam  Immunization History  Administered Date(s) Administered   Influenza-Unspecified 10/24/2015, 11/01/2016, 10/22/2017, 10/16/2018, 11/11/2019   Tdap 12/28/2019   Health Maintenance  Topic Date Due   COVID-19 Vaccine (1) Never done   OPHTHALMOLOGY EXAM  Never done   Hepatitis C Screening  Never done   PAP SMEAR-Modifier  12/18/2020   INFLUENZA VACCINE  08/15/2021   HEMOGLOBIN A1C  11/03/2021   Diabetic kidney evaluation - eGFR measurement  05/05/2022   Diabetic kidney evaluation - Urine ACR  05/05/2022   FOOT EXAM  02/24/2023   DTaP/Tdap/Td (2 - Td or Tdap) 12/27/2029   HIV Screening  Completed   HPV VACCINES  Aged Out   Discussed health benefits of physical activity, and encouraged her to engage in regular exercise appropriate for her age and condition.  Problem List Items Addressed This Visit       Cardiovascular and Mediastinum   Benign essential hypertension    BP at goal with metoprolol and metoprolol BP Readings from Last 3 Encounters:  02/23/22 120/82  05/04/21 122/80  02/17/21 128/80    Maintain med doses  Refills sent      Relevant Medications   tirzepatide (MOUNJARO) 2.5 MG/0.5ML Pen   amLODipine (NORVASC) 10 MG tablet   metoprolol succinate (TOPROL-XL) 100 MG 24 hr tablet     Endocrine   Type 2 diabetes mellitus with hyperglycemia, with long-term current use of insulin ([place])    Ozempic was discontinued by patient due to lack of insurance coverage. She now has insurance and will like to switch to [employer]. She denies any adverse effects with ozempic in the past. Normal urine microalbumin Normal foot exam today Advised to schedule appt with ophthalmology for  annual eye exam. BP at goal  Repeat lipid panel, CMP, and hgb A1c Sent mounjaro 2.28m F/up in 152month    Relevant Medications   tirzepatide (MOUNJARO) 2.5 MG/0.5ML Pen     Other   Mixed hyperlipidemia   Relevant Medications   tirzepatide (MOUNJARO) 2.5 MG/0.5ML Pen   amLODipine (NORVASC) 10 MG tablet   metoprolol succinate (TOPROL-XL) 100 MG 24 hr tablet   Moderate episode of recurrent major depressive disorder (HCC)    Worsening mood without use of wellbutrin. Medication discontinued due to lack of insurance. She agreed to psychology referral Wellbutrin 7561mID sent F/up in 31mo70month  Relevant Medications   buPROPion (WELLBUTRIN) 75 MG tablet   Other Relevant Orders   Ambulatory referral to Psychology   Morbid obesity (HCC)[place] Continues to struggle with stress eating and low energy. Reports increased appetite and fatigue exacerbated by uncontrolled depression and financial strain. She was able to loss 10lbs last year with GLP-1 injection. Will resume this. She plans to consult with bariatric clinic for gastric surgery. She has not participated in any previous weight loss program. Hx of type 2 DM, hyperlipidemia, and HTN Wt Readings from Last 3 Encounters:  02/23/22 (!) 346 lb (156.9 kg)  05/04/21 (!) 311 lb 6.4 oz (141.3 kg)  07/08/20 (!) 323  lb (146.5 kg)    Advised about importance of daily exercise: start with low impact exercise 15-68mns daily (walking in her neighborhood or exercise video at home). Advised to cook meals at home and how to measure appropriate meal portions. Check vit, D. Tsh, hgbA1c, lipid panel, CMP start mounjaro 2.56mF/up in 64m43month   Relevant Medications   tirzepatide (MOUNJARO) 2.5 MG/0.5ML Pen   Other Relevant Orders   Lipid panel   TSH   Vitamin D deficiency   Relevant Orders   VITAMIN D 25 Hydroxy (Vit-D Deficiency, Fractures)   Other Visit Diagnoses     Encounter for preventative adult health care exam with abnormal findings     -  Primary   Relevant Orders   Comprehensive metabolic panel      Return in about 4 weeks (around 03/23/2022) for DM, depression and anxiety, Weight management.     ChaWilfred LacyP

## 2022-02-23 NOTE — Patient Instructions (Addendum)
Schedule appt with GYN for breast and pelvic exam Schedule appt with annual DM eye exam Maintain heart healthy diet and daily exercise Start wellbutrin 70m daily x 7days, then 753mBID continuously. You will be contacted to schedule appt with psychology. Start mounjaro Go to lab

## 2022-02-23 NOTE — Assessment & Plan Note (Addendum)
Continues to struggle with stress eating and low energy. Reports increased appetite and fatigue exacerbated by uncontrolled depression and financial strain. She was able to loss 10lbs last year with GLP-1 injection. Will resume this. She plans to consult with bariatric clinic for gastric surgery. She has not participated in any previous weight loss program. Hx of type 2 DM, hyperlipidemia, and HTN Wt Readings from Last 3 Encounters:  02/23/22 (!) 346 lb (156.9 kg)  05/04/21 (!) 311 lb 6.4 oz (141.3 kg)  07/08/20 (!) 323 lb (146.5 kg)    Advised about importance of daily exercise: start with low impact exercise 15-46mns daily (walking in her neighborhood or exercise video at home). Advised to cook meals at home and how to measure appropriate meal portions. Check vit, D. Tsh, hgbA1c, lipid panel, CMP start mounjaro 2.523mF/up in 36m46month

## 2022-02-24 ENCOUNTER — Other Ambulatory Visit (HOSPITAL_COMMUNITY): Payer: Self-pay

## 2022-02-24 MED ORDER — VITAMIN D (ERGOCALCIFEROL) 1.25 MG (50000 UNIT) PO CAPS
50000.0000 [IU] | ORAL_CAPSULE | ORAL | 0 refills | Status: DC
  Filled 2022-02-24: qty 12, 84d supply, fill #0

## 2022-02-24 NOTE — Addendum Note (Signed)
Addended by: Leana Gamer on: 02/24/2022 02:52 PM   Modules accepted: Orders, Level of Service

## 2022-02-26 ENCOUNTER — Other Ambulatory Visit (HOSPITAL_COMMUNITY): Payer: Self-pay

## 2022-02-27 ENCOUNTER — Telehealth: Payer: Self-pay | Admitting: Nurse Practitioner

## 2022-02-27 NOTE — Telephone Encounter (Signed)
Letter for bariatric clinic written

## 2022-02-27 NOTE — Telephone Encounter (Signed)
Made Casee aware  via Mychart as per requested by patient

## 2022-03-14 ENCOUNTER — Other Ambulatory Visit (HOSPITAL_COMMUNITY): Payer: Self-pay

## 2022-03-14 DIAGNOSIS — N393 Stress incontinence (female) (male): Secondary | ICD-10-CM | POA: Diagnosis not present

## 2022-03-14 DIAGNOSIS — K649 Unspecified hemorrhoids: Secondary | ICD-10-CM | POA: Diagnosis not present

## 2022-03-14 DIAGNOSIS — Z01419 Encounter for gynecological examination (general) (routine) without abnormal findings: Secondary | ICD-10-CM | POA: Diagnosis not present

## 2022-03-14 DIAGNOSIS — Z6841 Body Mass Index (BMI) 40.0 and over, adult: Secondary | ICD-10-CM | POA: Diagnosis not present

## 2022-03-14 LAB — HM PAP SMEAR: HM Pap smear: NEGATIVE

## 2022-03-14 MED ORDER — HYDROCORTISONE ACETATE 25 MG RE SUPP
RECTAL | 0 refills | Status: DC
  Filled 2022-03-14: qty 28, 14d supply, fill #0

## 2022-03-19 ENCOUNTER — Encounter: Payer: Self-pay | Admitting: Nurse Practitioner

## 2022-03-19 ENCOUNTER — Ambulatory Visit (INDEPENDENT_AMBULATORY_CARE_PROVIDER_SITE_OTHER): Payer: 59 | Admitting: Clinical

## 2022-03-19 ENCOUNTER — Encounter (HOSPITAL_COMMUNITY): Payer: Self-pay

## 2022-03-19 ENCOUNTER — Encounter (HOSPITAL_COMMUNITY): Payer: Self-pay | Admitting: Clinical

## 2022-03-19 DIAGNOSIS — Z8659 Personal history of other mental and behavioral disorders: Secondary | ICD-10-CM | POA: Diagnosis not present

## 2022-03-19 DIAGNOSIS — F411 Generalized anxiety disorder: Secondary | ICD-10-CM | POA: Diagnosis not present

## 2022-03-19 DIAGNOSIS — F330 Major depressive disorder, recurrent, mild: Secondary | ICD-10-CM
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
## 2022-03-21 ENCOUNTER — Other Ambulatory Visit: Payer: Self-pay

## 2022-03-21 ENCOUNTER — Other Ambulatory Visit: Payer: Self-pay | Admitting: Gastroenterology

## 2022-03-21 ENCOUNTER — Other Ambulatory Visit (HOSPITAL_COMMUNITY): Payer: Self-pay

## 2022-03-21 MED ORDER — PANTOPRAZOLE SODIUM 40 MG PO TBEC
40.0000 mg | DELAYED_RELEASE_TABLET | Freq: Every day | ORAL | 3 refills | Status: DC
  Filled 2022-03-21: qty 90, 90d supply, fill #0
  Filled 2022-07-16: qty 90, 90d supply, fill #1
  Filled 2022-10-15: qty 90, 90d supply, fill #2
  Filled 2023-03-15: qty 30, 30d supply, fill #3

## 2022-03-26 ENCOUNTER — Other Ambulatory Visit (HOSPITAL_COMMUNITY): Payer: Self-pay

## 2022-03-29 ENCOUNTER — Encounter: Payer: Self-pay | Admitting: Nurse Practitioner

## 2022-03-29 ENCOUNTER — Ambulatory Visit (INDEPENDENT_AMBULATORY_CARE_PROVIDER_SITE_OTHER): Payer: 59 | Admitting: Nurse Practitioner

## 2022-03-29 ENCOUNTER — Other Ambulatory Visit (HOSPITAL_COMMUNITY): Payer: Self-pay

## 2022-03-29 VITALS — BP 122/80 | HR 69 | Resp 16 | Ht 67.0 in | Wt 330.8 lb

## 2022-03-29 DIAGNOSIS — E1169 Type 2 diabetes mellitus with other specified complication: Secondary | ICD-10-CM | POA: Diagnosis not present

## 2022-03-29 DIAGNOSIS — Z30430 Encounter for insertion of intrauterine contraceptive device: Secondary | ICD-10-CM | POA: Diagnosis not present

## 2022-03-29 DIAGNOSIS — Z794 Long term (current) use of insulin: Secondary | ICD-10-CM | POA: Diagnosis not present

## 2022-03-29 DIAGNOSIS — F331 Major depressive disorder, recurrent, moderate: Secondary | ICD-10-CM | POA: Diagnosis not present

## 2022-03-29 MED ORDER — BUPROPION HCL 75 MG PO TABS
75.0000 mg | ORAL_TABLET | Freq: Every day | ORAL | 1 refills | Status: DC
  Filled 2022-03-29 – 2022-05-23 (×2): qty 90, 90d supply, fill #0

## 2022-03-29 MED ORDER — TIRZEPATIDE 5 MG/0.5ML ~~LOC~~ SOAJ
5.0000 mg | SUBCUTANEOUS | 1 refills | Status: DC
  Filled 2022-03-29: qty 6, 84d supply, fill #0
  Filled 2022-04-02 – 2022-04-04 (×3): qty 2, 28d supply, fill #0
  Filled 2022-05-23: qty 2, 28d supply, fill #1
  Filled 2022-06-26: qty 2, 28d supply, fill #2
  Filled 2022-08-20: qty 2, 28d supply, fill #3
  Filled 2022-10-15: qty 2, 28d supply, fill #4

## 2022-03-29 NOTE — Assessment & Plan Note (Signed)
Lost 16lbs in last 30days with use of mounjra 2.'5mg'$  weekly Denies any adverse side effects. Exercise: walking once a week, 3mle. Diet: avoids eating out, eats 2x/day Wt Readings from Last 3 Encounters:  03/29/22 (!) 330 lb 12.8 oz (150 kg)  02/23/22 (!) 346 lb (156.9 kg)  05/04/21 (!) 311 lb 6.4 oz (141.3 kg)    Increase med dose to '5mg'$  weekly Advised about need for balanced heart healthy diet and daily exercise

## 2022-03-29 NOTE — Patient Instructions (Signed)
Need daily exercise and high fiber/iron rich/low fat/high protein/low carb diet.

## 2022-03-29 NOTE — Progress Notes (Signed)
Established Patient Visit  Patient: Cheryl Ayala   DOB: Sep 20, 1985   37 y.o. Female  MRN: [id number] Visit Date: 03/29/2022  Subjective:    Chief Complaint  Patient presents with   Weight management     Wants to increase dosage of mounjaro-   Wellbutrin is knocking the edge off    HPI Morbid obesity ([place]) Lost 16lbs in last 30days with use of mounjra 2.'5mg'$  weekly Denies any adverse side effects. Exercise: walking once a week, 90mle. Diet: avoids eating out, eats 2x/day Wt Readings from Last 3 Encounters:  03/29/22 (!) 330 lb 12.8 oz (150 kg)  02/23/22 (!) 346 lb (156.9 kg)  05/04/21 (!) 311 lb 6.4 oz (141.3 kg)    Increase med dose to '5mg'$  weekly Advised about need for balanced heart healthy diet and daily exercise  Mod[place] episode of [place] Started CBT sessions Every of week  Reports improved mood with wellbutrin '75mg'$  daily. She want to maintain this dose for now. Denies any adverse side effects F/up in 329month BP Readings from Last 3 Encounters:  03/29/22 122/80  02/23/22 120/82  05/04/21 122/80    Reviewed medical, surgical, and social history today  Medications: Outpatient Medications Prior to Visit  Medication Sig   amLODipine (NORVASC) 10 MG tablet Take 1 tablet (10 mg total) by mouth daily.   blood glucose meter kit and supplies Dispense based on patient and insurance preference. Use once a day before breakfast. ICD10:E11.65   Blood Glucose Monitoring Suppl (FREESTYLE LITE) w/Device KIT Use to directed to test blood sugar once a day befor breakfast   glucose blood test strip Use as directed to check blood sugar once a day before breakfast   hydrocortisone (ANUSOL-HC) 25 MG suppository Insert 1 suppository rectally twice a day for 14 days.   Lancets (FREESTYLE) lancets Use as directed to check blood sugar once a day before breakfast   metoprolol succinate (TOPROL-XL) 100 MG 24 hr tablet Take 1 tablet (100 mg  total) by mouth daily. Take with or immediately following a meal.   pantoprazole (PROTONIX) 40 MG tablet Take 1 tablet (40 mg total) by mouth daily.   Vitamin D, Ergocalciferol, (DRISDOL) 1.25 MG (50000 UNIT) CAPS capsule Take 1 capsule (50,000 Units total) by mouth every 7 (seven) days.   [DISCONTINUED] buPROPion (WELLBUTRIN) 75 MG tablet Take 1 tablet (75 mg total) by mouth 2 (two) times daily.   [DISCONTINUED] omeprazole (PRILOSEC) 40 MG capsule TAKE 1 CAPSULE BY MOUTH 2 TIMES DAILY   [DISCONTINUED] tirzepatide (MOUNJARO) 2.5 MG/0.5ML Pen Inject 2.5 mg into the skin once a week.   No facility-administered medications prior to visit.   Reviewed past medical and social history.   ROS per HPI above  Last metabolic panel Lab Results  Component Value Date   GLUCOSE 87 02/23/2022   NA 141 02/23/2022   K 4.0 02/23/2022   CL 103 02/23/2022   CO2 27 02/23/2022   BUN 13 02/23/2022   CREATININE 0.83 02/23/2022   GFRNONAA >60 01/25/2020   CALCIUM 8.8 02/23/2022   PROT 6.6 02/23/2022   ALBUMIN 3.9 02/23/2022   LABGLOB 2.3 05/15/2017   AGRATIO 1.7 05/15/2017   BILITOT 0.3 02/23/2022   ALKPHOS 47 02/23/2022   AST 12 02/23/2022   ALT 14 02/23/2022   ANIONGAP 10 01/09/2020   Last lipids Lab Results  Component Value Date   CHOL 161 02/23/2022  HDL 35.10 (L) 02/23/2022   LDLCALC 103 (H) 02/23/2022   TRIG 114.0 02/23/2022   CHOLHDL 5 02/23/2022   Last hemoglobin A1c Lab Results  Component Value Date   HGBA1C 6.7 (H) 02/23/2022        Objective:  BP 122/80 (BP Location: Left Arm, Patient Position: Sitting, Cuff Size: Large)   Pulse 69   Resp 16   Ht '5\' 7"'$  (1.702 m)   Wt (!) 330 lb 12.8 oz (150 kg)   SpO2 97%   BMI 51.81 kg/m      Physical Exam Cardiovascular:     Rate and Rhythm: Normal rate.     Pulses: Normal pulses.  Pulmonary:     Effort: Pulmonary effort is normal.  Neurological:     Mental Status: She is alert and oriented to person, place, and time.      No results found for any visits on 03/29/22.    Assessment & Plan:    Problem List Items Addressed This Visit       Endocrine   Type 2 diabetes mellitus with hyperglycemia, with long-term current use of insulin ([place]) - Primary   Relevant Medications   tirzepatide (MOUNJARO) 5 MG/0.5ML Pen     Other   Moderate episode of recurrent major depressive disorder ([place])    Started CBT sessions Every of week  Reports improved mood with wellbutrin '75mg'$  daily. She want to maintain this dose for now. Denies any adverse side effects F/up in 48month      Relevant Medications   buPROPion (WELLBUTRIN) 75 MG tablet   Morbid obesity ([place]    Lost 16lbs in last 30days with use of mounjra 2.'5mg'$  weekly Denies any adverse side effects. Exercise: walking once a week, 174me. Diet: avoids eating out, eats 2x/day Wt Readings from Last 3 Encounters:  03/29/22 (!) 330 lb 12.8 oz (150 kg)  02/23/22 (!) 346 lb (156.9 kg)  05/04/21 (!) 311 lb 6.4 oz (141.3 kg)    Increase med dose to '5mg'$  weekly Advised about need for balanced heart healthy diet and daily exercise      Relevant Medications   tirzepatide ([facility]5 MG/0.5ML Pen   Return in about 3 months (around 06/29/2022) for DM, Anemia, vit. D, hyperlipidemia (fasting).     ChWilfred LacyNP

## 2022-03-29 NOTE — Assessment & Plan Note (Signed)
Started CBT sessions Every of week  Reports improved mood with wellbutrin '75mg'$  daily. She want to maintain this dose for now. Denies any adverse side effects F/up in 72month

## 2022-04-01 ENCOUNTER — Telehealth: Payer: 59 | Admitting: Physician Assistant

## 2022-04-01 DIAGNOSIS — B3731 Acute candidiasis of vulva and vagina: Secondary | ICD-10-CM

## 2022-04-02 ENCOUNTER — Other Ambulatory Visit (HOSPITAL_COMMUNITY): Payer: Self-pay

## 2022-04-02 MED ORDER — FLUCONAZOLE 150 MG PO TABS
150.0000 mg | ORAL_TABLET | ORAL | 0 refills | Status: DC | PRN
  Filled 2022-04-02: qty 2, 6d supply, fill #0

## 2022-04-02 NOTE — Progress Notes (Signed)
E-Visit for Vaginal Symptoms  We are sorry that you are not feeling well. Here is how we plan to help! Based on what you shared with me it looks like you: May have a yeast vaginosis  Vaginosis is an inflammation of the vagina that can result in discharge, itching and pain. The cause is usually a change in the normal balance of vaginal bacteria or an infection. Vaginosis can also result from reduced estrogen levels after menopause.  The most common causes of vaginosis are:   Bacterial vaginosis which results from an overgrowth of one on several organisms that are normally present in your vagina.   Yeast infections which are caused by a naturally occurring fungus called candida.   Vaginal atrophy (atrophic vaginosis) which results from the thinning of the vagina from reduced estrogen levels after menopause.   Trichomoniasis which is caused by a parasite and is commonly transmitted by sexual intercourse.  Factors that increase your risk of developing vaginosis include: Medications, such as antibiotics and steroids Uncontrolled diabetes Use of hygiene products such as bubble bath, vaginal spray or vaginal deodorant Douching Wearing damp or tight-fitting clothing Using an intrauterine device (IUD) for birth control Hormonal changes, such as those associated with pregnancy, birth control pills or menopause Sexual activity Having a sexually transmitted infection  Your treatment plan is Diflucan (fluconazole) 150mg tablet once, repeat in 72 hours if needed.  I have electronically sent this prescription into the pharmacy that you have chosen.  Be sure to take all of the medication as directed. Stop taking any medication if you develop a rash, tongue swelling or shortness of breath. Mothers who are breast feeding should consider pumping and discarding their breast milk while on these antibiotics. However, there is no consensus that infant exposure at these doses would be harmful.  Remember that  medication creams can weaken latex condoms. .   HOME CARE:  Good hygiene may prevent some types of vaginosis from recurring and may relieve some symptoms:  Avoid baths, hot tubs and whirlpool spas. Rinse soap from your outer genital area after a shower, and dry the area well to prevent irritation. Don't use scented or harsh soaps, such as those with deodorant or antibacterial action. Avoid irritants. These include scented tampons and pads. Wipe from front to back after using the toilet. Doing so avoids spreading fecal bacteria to your vagina.  Other things that may help prevent vaginosis include:  Don't douche. Your vagina doesn't require cleansing other than normal bathing. Repetitive douching disrupts the normal organisms that reside in the vagina and can actually increase your risk of vaginal infection. Douching won't clear up a vaginal infection. Use a latex condom. Both female and female latex condoms may help you avoid infections spread by sexual contact. Wear cotton underwear. Also wear pantyhose with a cotton crotch. If you feel comfortable without it, skip wearing underwear to bed. Yeast thrives in moist environments Your symptoms should improve in the next day or two.  GET HELP RIGHT AWAY IF:  You have pain in your lower abdomen ( pelvic area or over your ovaries) You develop nausea or vomiting You develop a fever Your discharge changes or worsens You have persistent pain with intercourse You develop shortness of breath, a rapid pulse, or you faint.  These symptoms could be signs of problems or infections that need to be evaluated by a medical provider now.  MAKE SURE YOU   Understand these instructions. Will watch your condition. Will get help right away   if you are not doing well or get worse.  Thank you for choosing an e-visit.  Your e-visit answers were reviewed by a board certified advanced clinical practitioner to complete your personal care plan. Depending upon the  condition, your plan could have included both over the counter or prescription medications.  Please review your pharmacy choice. Make sure the pharmacy is open so you can pick up prescription now. If there is a problem, you may contact your provider through MyChart messaging and have the prescription routed to another pharmacy.  Your safety is important to us. If you have drug allergies check your prescription carefully.   For the next 24 hours you can use MyChart to ask questions about today's visit, request a non-urgent call back, or ask for a work or school excuse. You will get an email in the next two days asking about your experience. I hope that your e-visit has been valuable and will speed your recovery.  I have spent 5 minutes in review of e-visit questionnaire, review and updating patient chart, medical decision making and response to patient.   Jachai Okazaki M Sarajean Dessert, PA-C  

## 2022-04-03 ENCOUNTER — Other Ambulatory Visit (HOSPITAL_COMMUNITY): Payer: Self-pay

## 2022-04-04 ENCOUNTER — Other Ambulatory Visit (HOSPITAL_COMMUNITY): Payer: Self-pay

## 2022-04-04 ENCOUNTER — Encounter: Payer: Self-pay | Admitting: Nurse Practitioner

## 2022-04-06 ENCOUNTER — Other Ambulatory Visit (HOSPITAL_COMMUNITY): Payer: Self-pay

## 2022-04-09 ENCOUNTER — Telehealth: Payer: 59 | Admitting: Physician Assistant

## 2022-04-09 ENCOUNTER — Encounter: Payer: Self-pay | Admitting: Dermatology

## 2022-04-09 ENCOUNTER — Other Ambulatory Visit (HOSPITAL_COMMUNITY): Payer: Self-pay

## 2022-04-09 ENCOUNTER — Ambulatory Visit (INDEPENDENT_AMBULATORY_CARE_PROVIDER_SITE_OTHER): Payer: 59 | Admitting: Dermatology

## 2022-04-09 VITALS — BP 125/87

## 2022-04-09 DIAGNOSIS — B379 Candidiasis, unspecified: Secondary | ICD-10-CM

## 2022-04-09 DIAGNOSIS — L720 Epidermal cyst: Secondary | ICD-10-CM | POA: Diagnosis not present

## 2022-04-09 DIAGNOSIS — D225 Melanocytic nevi of trunk: Secondary | ICD-10-CM

## 2022-04-09 DIAGNOSIS — R3989 Other symptoms and signs involving the genitourinary system: Secondary | ICD-10-CM

## 2022-04-09 DIAGNOSIS — T3695XA Adverse effect of unspecified systemic antibiotic, initial encounter: Secondary | ICD-10-CM | POA: Diagnosis not present

## 2022-04-09 DIAGNOSIS — L918 Other hypertrophic disorders of the skin: Secondary | ICD-10-CM

## 2022-04-09 DIAGNOSIS — D485 Neoplasm of uncertain behavior of skin: Secondary | ICD-10-CM | POA: Diagnosis not present

## 2022-04-09 DIAGNOSIS — Z1283 Encounter for screening for malignant neoplasm of skin: Secondary | ICD-10-CM

## 2022-04-09 MED ORDER — FLUCONAZOLE 150 MG PO TABS
150.0000 mg | ORAL_TABLET | ORAL | 0 refills | Status: DC | PRN
  Filled 2022-04-09: qty 2, 6d supply, fill #0

## 2022-04-09 MED ORDER — NITROFURANTOIN MONOHYD MACRO 100 MG PO CAPS
100.0000 mg | ORAL_CAPSULE | Freq: Two times a day (BID) | ORAL | 0 refills | Status: DC
  Filled 2022-04-09: qty 10, 5d supply, fill #0

## 2022-04-09 NOTE — Progress Notes (Signed)
New Patient Visit  Subjective  Cheryl Ayala is a 37 y.o. female who presents for the following: Other (Mole on neck that has been there for years but it looks a little funky. She has a cyst on her back and some skin tags. The patient presents for Total-Body Skin Exam (TBSE) for skin cancer screening and mole check.  The patient has spots, moles and lesions to be evaluated, some may be new or changing and the patient has concerns that these could be cancer./).    The following portions of the chart were reviewed this encounter and updated as appropriate:   Tobacco  Allergies  Meds  Problems  Med Hx  Surg Hx  Fam Hx      Review of Systems:  No other skin or systemic complaints except as noted in HPI or Assessment and Plan.  Objective  Well appearing patient in no apparent distress; mood and affect are within normal limits.  A full examination was performed including scalp, head, eyes, ears, nose, lips, neck, chest, axillae, abdomen, back, buttocks, bilateral upper extremities, bilateral lower extremities, hands, feet, fingers, toes, fingernails, and toenails. All findings within normal limits unless otherwise noted below.  Right Axilla Pedunculated flesh papules  Left abdomen Irregular brown macule 5 mm       Right periumbilical Irregular brown macule 6 mm     Right Lower Back Irregular brown macule 8 mm       Mid Back SQ nodule    Assessment & Plan  Skin tag Right Axilla  Skin tag removal generally is considered cosmetic and not covered by insurance.    Other Procedure(Skin Tags): Quote for cosmetic removal:  $200 to remove on 1 area $300 to remove on 2 areas  $400 to removal on 3 areas   Neoplasm of uncertain behavior of skin (3) Left abdomen  Epidermal / dermal shaving  Lesion diameter (cm):  0.5 Informed consent: discussed and consent obtained   Timeout: patient name, date of birth, surgical site, and procedure verified   Procedure  prep:  Patient was prepped and draped in usual sterile fashion Prep type:  Isopropyl alcohol Anesthesia: the lesion was anesthetized in a standard fashion   Anesthetic:  1% lidocaine w/ epinephrine 1-100,000 buffered w/ 8.4% NaHCO3 Instrument used: flexible razor blade   Hemostasis achieved with: pressure, aluminum chloride and electrodesiccation   Outcome: patient tolerated procedure well   Post-procedure details: sterile dressing applied and wound care instructions given   Dressing type: bandage and petrolatum    Specimen 1 - Surgical pathology Differential Diagnosis: Nevus vs dysplastic nevus Check Margins: No  Right periumbilical  Epidermal / dermal shaving  Lesion diameter (cm):  0.6 Informed consent: discussed and consent obtained   Timeout: patient name, date of birth, surgical site, and procedure verified   Procedure prep:  Patient was prepped and draped in usual sterile fashion Prep type:  Isopropyl alcohol Anesthesia: the lesion was anesthetized in a standard fashion   Anesthetic:  1% lidocaine w/ epinephrine 1-100,000 buffered w/ 8.4% NaHCO3 Instrument used: flexible razor blade   Hemostasis achieved with: pressure, aluminum chloride and electrodesiccation   Outcome: patient tolerated procedure well   Post-procedure details: sterile dressing applied and wound care instructions given   Dressing type: bandage and petrolatum    Specimen 2 - Surgical pathology Differential Diagnosis: Nevus vs dysplastic nevus Check Margins: No  Right Lower Back  Epidermal / dermal shaving  Lesion diameter (cm):  0.8 Informed consent: discussed  and consent obtained   Timeout: patient name, date of birth, surgical site, and procedure verified   Procedure prep:  Patient was prepped and draped in usual sterile fashion Prep type:  Isopropyl alcohol Anesthesia: the lesion was anesthetized in a standard fashion   Anesthetic:  1% lidocaine w/ epinephrine 1-100,000 buffered w/ 8.4%  NaHCO3 Instrument used: flexible razor blade   Hemostasis achieved with: pressure, aluminum chloride and electrodesiccation   Outcome: patient tolerated procedure well   Post-procedure details: sterile dressing applied and wound care instructions given   Dressing type: bandage and petrolatum    Specimen 3 - Surgical pathology Differential Diagnosis: Nevus vs dysplastic nevus Check Margins: No  Epidermal inclusion cyst Mid Back  Benign-appearing.  Observation.  Call clinic for new or changing lesions.  Recommend daily use of broad spectrum spf 30+ sunscreen to sun-exposed areas.     Lentigines - Scattered tan macules - Due to sun exposure - Benign-appearing, observe - Recommend daily broad spectrum sunscreen SPF 30+ to sun-exposed areas, reapply every 2 hours as needed. - Call for any changes  Seborrheic Keratoses - Stuck-on, waxy, tan-brown papules and/or plaques  - Benign-appearing - Discussed benign etiology and prognosis. - Observe - Call for any changes  Melanocytic Nevi - Tan-brown and/or pink-flesh-colored symmetric macules and papules - Benign appearing on exam today - Observation - Call clinic for new or changing moles - Recommend daily use of broad spectrum spf 30+ sunscreen to sun-exposed areas.   Hemangiomas - Red papules - Discussed benign nature - Observe - Call for any changes  Actinic Damage - Chronic condition, secondary to cumulative UV/sun exposure - diffuse scaly erythematous macules with underlying dyspigmentation - Recommend daily broad spectrum sunscreen SPF 30+ to sun-exposed areas, reapply every 2 hours as needed.  - Staying in the shade or wearing long sleeves, sun glasses (UVA+UVB protection) and wide brim hats (4-inch brim around the entire circumference of the hat) are also recommended for sun protection.  - Call for new or changing lesions.  Skin cancer screening performed today.  Return in about 1 year (around 04/09/2023) for  TBSE.   I, Ashok Cordia, CMA, am acting as scribe for Ellard Artis, MD .  Documentation: I have reviewed the above documentation for accuracy and completeness, and I agree with the above  [place], DO

## 2022-04-09 NOTE — Patient Instructions (Addendum)
Other Procedure(Skin Tags): Quote for cosmetic removal:  $200 to remove on 1 area $300 to remove on 2 areas  $400 to removal on 3 areas     Patient Handout: Wound Care for Skin Biopsy Site  Patient Handout: Wound Care for Skin Biopsy Site  Taking Care of Your Skin Biopsy Site  Proper care of the biopsy site is essential for promoting healing and minimizing scarring. This handout provides instructions on how to care for your biopsy site to ensure optimal recovery.  1. Cleaning the Wound:  Clean the biopsy site daily with gentle soap and water. Gently pat the area dry with a clean, soft towel. Avoid harsh scrubbing or rubbing the area, as this can irritate the skin and delay healing.  2. Applying Aquaphor and Bandage:  After cleaning the wound, apply a thin layer of Aquaphor ointment to the biopsy site. Cover the area with a sterile bandage to protect it from dirt, bacteria, and friction. Change the bandage daily or as needed if it becomes soiled or wet.  3. Continued Care for One Week:  Repeat the cleaning, Aquaphor application, and bandaging process daily for one week following the biopsy procedure. Keeping the wound clean and moist during this initial healing period will help prevent infection and promote optimal healing.  4. Massaging Aquaphor into the Area:  ---After one week, discontinue the use of bandages but continue to apply Aquaphor to the biopsy site. ----Gently massage the Aquaphor into the area using circular motions. ---Massaging the skin helps to promote circulation and prevent the formation of scar tissue.   Additional Tips:  Avoid exposing the biopsy site to direct sunlight during the healing process, as this can cause hyperpigmentation or worsen scarring. If you experience any signs of infection, such as increased redness, swelling, warmth, or drainage from the wound, contact your healthcare provider immediately. Follow any additional instructions provided  by your healthcare provider for caring for the biopsy site and managing any discomfort. Conclusion:  Taking proper care of your skin biopsy site is crucial for ensuring optimal healing and minimizing scarring. By following these instructions for cleaning, applying Aquaphor, and massaging the area, you can promote a smooth and successful recovery. If you have any questions or concerns about caring for your biopsy site, don't hesitate to contact your healthcare provider for guidance.     Due to recent changes in healthcare laws, you may see results of your pathology and/or laboratory studies on MyChart before the doctors have had a chance to review them. We understand that in some cases there may be results that are confusing or concerning to you. Please understand that not all results are received at the same time and often the doctors may need to interpret multiple results in order to provide you with the best plan of care or course of treatment. Therefore, we ask that you please give [place] 2 business days to thoroughly review all your results before contacting the office for clarification. Should we see a critical lab result, you will be contacted sooner.   If You Need Anything After Your Visit  If you have any questions or concerns for your doctor, please call our main line at [phone] If no one answers, please leave a voicemail as directed and we will return your call as soon as possible. Messages left after 4 pm will be answered the following business day.   You may also send [place] a message via [place]. We typically respond to MyChart messages within 1-2  business days.  For prescription refills, please ask your pharmacy to contact our office. Our fax number is [phone].  If you have an urgent issue when the clinic is closed that cannot wait until the next business day, you can page your doctor at the number below.    Please note that while we do our best to be available for urgent issues  outside of office hours, we are not available 24/7.   If you have an urgent issue and are unable to reach [place], you may choose to seek medical care at your doctor's office, retail clinic, urgent care center, or emergency room.  If you have a medical emergency, please immediately call 911 or go to the emergency department. In the event of inclement weather, please call our main line at [phone] for an update on the status of any delays or closures.  Dermatology Medication Tips: Please keep the boxes that topical medications come in in order to help keep track of the instructions about where and how to use these. Pharmacies typically print the medication instructions only on the boxes and not directly on the medication tubes.   If your medication is too expensive, please contact our office at [phone] or send [place] a message through [place].   We are unable to tell what your co-pay for medications will be in advance as this is different depending on your insurance coverage. However, we may be able to find a substitute medication at lower cost or fill out paperwork to get insurance to cover a needed medication.   If a prior authorization is required to get your medication covered by your insurance company, please allow [place] 1-2 business days to complete this process.  Drug prices often vary depending on where the prescription is filled and some pharmacies may offer cheaper prices.  The website www.goodrx.com contains coupons for medications through different pharmacies. The prices here do not account for what the cost may be with help from insurance (it may be cheaper with your insurance), but the website can give you the price if you did not use any insurance.  - You can print the associated coupon and take it with your prescription to the pharmacy.  - You may also stop by our office during regular business hours and pick up a GoodRx coupon card.  - If you need your prescription sent  electronically to a different pharmacy, notify our office through [facility] or by phone at [phone]

## 2022-04-09 NOTE — Progress Notes (Signed)
E-Visit for Urinary Problems  We are sorry that you are not feeling well.  Here is how we plan to help!  Based on what you shared with me it looks like you most likely have a simple urinary tract infection.  A UTI (Urinary Tract Infection) is a bacterial infection of the bladder.  Most cases of urinary tract infections are simple to treat but a key part of your care is to encourage you to drink plenty of fluids and watch your symptoms carefully.  I have prescribed MacroBid 100 mg twice a day for 5 days and Fluconazole 150mg  Take 1 tablet once at onset of yeast infection symptoms, may repeat in 72 hours or once antibiotic is completed.  Your symptoms should gradually improve. Call us if the burning in your urine worsens, you develop worsening fever, back pain or pelvic pain or if your symptoms do not resolve after completing the antibiotic.  Urinary tract infections can be prevented by drinking plenty of water to keep your body hydrated.  Also be sure when you wipe, wipe from front to back and don't hold it in!  If possible, empty your bladder every 4 hours.  HOME CARE Drink plenty of fluids Compete the full course of the antibiotics even if the symptoms resolve Remember, when you need to go.go. Holding in your urine can increase the likelihood of getting a UTI! GET HELP RIGHT AWAY IF: You cannot urinate You get a high fever Worsening back pain occurs You see blood in your urine You feel sick to your stomach or throw up You feel like you are going to pass out  MAKE SURE YOU  Understand these instructions. Will watch your condition. Will get help right away if you are not doing well or get worse.   Thank you for choosing an e-visit.  Your e-visit answers were reviewed by a board certified advanced clinical practitioner to complete your personal care plan. Depending upon the condition, your plan could have included both over the counter or prescription medications.  Please review  your pharmacy choice. Make sure the pharmacy is open so you can pick up prescription now. If there is a problem, you may contact your provider through [employer] and have the prescription routed to another pharmacy.  Your safety is important to [place]. If you have drug allergies check your prescription carefully.   For the next 24 hours you can use MyChart to ask questions about today's visit, request a non-urgent call back, or ask for a work or school excuse. You will get an email in the next two days asking about your experience. I hope that your e-visit has been valuable and will speed your recovery.  I have spent 5 minutes in review of e-visit questionnaire, review and updating patient chart, medical decision making and response to patient.   Mar Daring, PA-C

## 2022-04-18 ENCOUNTER — Telehealth: Payer: 59 | Admitting: Physician Assistant

## 2022-04-18 ENCOUNTER — Other Ambulatory Visit (HOSPITAL_COMMUNITY): Payer: Self-pay

## 2022-04-18 DIAGNOSIS — B9689 Other specified bacterial agents as the cause of diseases classified elsewhere: Secondary | ICD-10-CM | POA: Diagnosis not present

## 2022-04-18 DIAGNOSIS — J019 Acute sinusitis, unspecified: Secondary | ICD-10-CM

## 2022-04-18 MED ORDER — DOXYCYCLINE HYCLATE 100 MG PO TABS
100.0000 mg | ORAL_TABLET | Freq: Two times a day (BID) | ORAL | 0 refills | Status: DC
  Filled 2022-04-18: qty 20, 10d supply, fill #0

## 2022-04-18 NOTE — Progress Notes (Signed)
E-Visit for Sinus Problems  We are sorry that you are not feeling well.  Here is how we plan to help!  Based on what you have shared with me it looks like you have sinusitis.  Sinusitis is inflammation and infection in the sinus cavities of the head.  Based on your presentation I believe you most likely have Acute Bacterial Sinusitis.  This is an infection caused by bacteria and is treated with antibiotics. I have prescribed Doxycycline 100mg by mouth twice a day for 10 days. You may use an oral decongestant such as Mucinex D or if you have glaucoma or high blood pressure use plain Mucinex. Saline nasal spray help and can safely be used as often as needed for congestion.  If you develop worsening sinus pain, fever or notice severe headache and vision changes, or if symptoms are not better after completion of antibiotic, please schedule an appointment with a health care provider.    Sinus infections are not as easily transmitted as other respiratory infection, however we still recommend that you avoid close contact with loved ones, especially the very young and elderly.  Remember to wash your hands thoroughly throughout the day as this is the number one way to prevent the spread of infection!  Home Care: Only take medications as instructed by your medical team. Complete the entire course of an antibiotic. Do not take these medications with alcohol. A steam or ultrasonic humidifier can help congestion.  You can place a towel over your head and breathe in the steam from hot water coming from a faucet. Avoid close contacts especially the very young and the elderly. Cover your mouth when you cough or sneeze. Always remember to wash your hands.  Get Help Right Away If: You develop worsening fever or sinus pain. You develop a severe head ache or visual changes. Your symptoms persist after you have completed your treatment plan.  Make sure you Understand these instructions. Will watch your  condition. Will get help right away if you are not doing well or get worse.  Thank you for choosing an e-visit.  Your e-visit answers were reviewed by a board certified advanced clinical practitioner to complete your personal care plan. Depending upon the condition, your plan could have included both over the counter or prescription medications.  Please review your pharmacy choice. Make sure the pharmacy is open so you can pick up prescription now. If there is a problem, you may contact your provider through MyChart messaging and have the prescription routed to another pharmacy.  Your safety is important to us. If you have drug allergies check your prescription carefully.   For the next 24 hours you can use MyChart to ask questions about today's visit, request a non-urgent call back, or ask for a work or school excuse. You will get an email in the next two days asking about your experience. I hope that your e-visit has been valuable and will speed your recovery.  I have spent 5 minutes in review of e-visit questionnaire, review and updating patient chart, medical decision making and response to patient.   Yelina Sarratt M Jaime Dome, PA-C  

## 2022-04-23 ENCOUNTER — Ambulatory Visit (HOSPITAL_COMMUNITY): Payer: 59 | Admitting: Clinical

## 2022-05-01 DIAGNOSIS — N393 Stress incontinence (female) (male): Secondary | ICD-10-CM | POA: Diagnosis not present

## 2022-05-01 DIAGNOSIS — K5900 Constipation, unspecified: Secondary | ICD-10-CM | POA: Diagnosis not present

## 2022-05-07 ENCOUNTER — Ambulatory Visit (INDEPENDENT_AMBULATORY_CARE_PROVIDER_SITE_OTHER): Payer: Self-pay | Admitting: Clinical

## 2022-05-07 DIAGNOSIS — Z91199 Patient's noncompliance with other medical treatment and regimen due to unspecified reason: Secondary | ICD-10-CM

## 2022-05-07 NOTE — Progress Notes (Signed)
Therapy Progress Note  Patient had an appointment scheduled with therapist on 05/07/2022  at 4:00pm.  She did not arrive for the session by 4:25pm, so was considered a no show.  As per [facility] policy, she will be be charged for this no-show appointment.    Encounter Diagnosis  Name Primary?   No-show for appointment Yes     Ambrose Mantle, LCSW 05/07/2022, 4:25 PM

## 2022-05-23 ENCOUNTER — Other Ambulatory Visit: Payer: Self-pay

## 2022-05-23 ENCOUNTER — Other Ambulatory Visit (HOSPITAL_COMMUNITY): Payer: Self-pay

## 2022-05-24 ENCOUNTER — Other Ambulatory Visit: Payer: Self-pay

## 2022-05-25 ENCOUNTER — Ambulatory Visit (INDEPENDENT_AMBULATORY_CARE_PROVIDER_SITE_OTHER): Payer: 59 | Admitting: Clinical

## 2022-05-25 ENCOUNTER — Encounter (HOSPITAL_COMMUNITY): Payer: Self-pay | Admitting: Clinical

## 2022-05-25 DIAGNOSIS — F411 Generalized anxiety disorder: Secondary | ICD-10-CM

## 2022-05-25 DIAGNOSIS — F330 Major depressive disorder, recurrent, mild: Secondary | ICD-10-CM | POA: Diagnosis not present

## 2022-05-25 DIAGNOSIS — Z8659 Personal history of other mental and behavioral disorders: Secondary | ICD-10-CM

## 2022-05-25 NOTE — Progress Notes (Signed)
THERAPIST PROGRESS NOTE  Session Time: 11:01am-12:02pm  Participation Level: Active  Behavioral Response: Casual Alert Depressed, Hopeless, and Irritable  Type of Therapy: Individual Therapy  Treatment Goals addressed:  STG: Cheryl Ayala will participate in at least 80% of scheduled individual psychotherapy sessions STG: Report a decrease in anxiety symptoms as evidenced by an overall reduction in anxiety score to no higher than 5 on the Generalized Anxiety Disorder Scale (GAD-7) STG: Cheryl Ayala will participate in at least 80% of scheduled individual psychotherapy sessions STG: Reduce overall depression score to no higher than 5 on the Patient Health Questionnaire (PHQ-9) LTG: Patient will be able to identify the reasons she overeats and will learn to sit with her feelings before starting to eat.  ProgressTowards Goals: Progressing  Interventions: CBT and Psychosocial Skills: "I" statements  Summary: Cheryl Ayala is a 37 y.o. female who presents with anxiety and depression for therapy.  She has been more depressed lately and having panic attacks as she tries to go to sleep.  When she has a random pain in her body, she will suddenly feel she is dying.  Her PCP prescribes her psychiatric medications, Wellbutrin prescribed at 75mg  BID but she had only been taking it once a day.  She recently double this to taking 150mg  once a day.  We discussed the possibility of her seeing a psychiatrist for her medication management since things are not improving, but she did not express readiness for that move just yet.  Most of this session was spent talking about patient's resentment of her husband because he only works part-time despite the availability of more work and their intense financial need.  Additionally, he does much less than she does toward child care and household chores.  He plays golf 1-2 times a week, for instance, and he stays up until around 3am playing video games.  In the mornings when  he is in charge of their 2yo twin sons, she is afraid he is not fully alert and therefore something bad could happen.  She does not get time away from the kids because she does not have a hobby, but feels she does not have time to develop a hobby because she does not have time away from the kids.  CSW explained the Cognitive Behavioral model and how thoughts impact our feelings.  CSW asked for her thoughts about husband when she is feeling resentment toward him.  She stated she thinks he is lazy, selfish, should work more, should provide more help with the kids, should not have to be told what to do around the house because he is a grown man, and such.  The accompanying cognitive distortions for each of these was pointed out and she was provided with a handout about cognitive distortions.  We explored how changing the wording would actually change the feeling.  She did state that he has had headaches for the past 8 years without resolution, which she knows makes it hard for him to function in some ways, and she does love him.    CSW talked at length about how to use "I" statements to ask for help and to ask to have her needs met.  She was in agreement and understood all the concepts presented.  She is nonetheless not sure she can do this.  Toward the very end of the session, patient stated that sometimes she is scared to tell her husband what she needs because she is afraid if he knows what she needs, he will  leave her.  She recognizes that th is is based on past relationships, but the fear is still real.  CSW acknowledged that we do not know what another person thinks or what action they are going to take until they say or do it.  Suicidal/Homicidal: No without intent/plan  Therapist Response: Patient is making progress by showing up for her appointment and talking openly and honestly about the things that are and have been bothering her.  She is very fatigued from working and taking care of kids and the  house.  There would seem to be some options for splitting duties more equitably, to which she did agree.   CSW recommended that she and her husband enter into couples therapy as well, but she thinks based on his past statements that he would be resistant to therapy help.  CSW advised how she can tell him she needs it and ask for his cooperation.  She felt better after unloading her burdens, but is fully aware that it took some time to get into the place she is (and they are) right now and that it will take some time to unravel it all and get to a better place.    CSW provided mood monitoring and treatment progress review in the context of this episode of treatment.   Patient reported that her mood has been "rough".   Patient was able to explore how she got to this point of depression and anxiety.  CSW gave patient the opportunity to explore thoughts and feelings associated with current life situations and past/present external stressors as desired.   CSW encouraged patient's expression of feelings and validated patient's thoughts, using empathy, active listening, open body language, and unconditional positive regard.  Patient demonstrated an orientation to time, place, person and situation.      Recommendations:  Return to therapy in 2-3 weeks, engage in self care behaviors, use "I" statements in talks with husband  Plan: Return again in 2-3 weeks.  Diagnosis:  Major depressive disorder, recurrent episode, mild (HCC)  Generalized anxiety disorder  History of attention deficit hyperactivity disorder (ADHD)  Collaboration of Care: Primary Care Provider AEB - can read therapy notes in Epic, also spoke with patient about whether she needs to see psychiatrist for her medications at this point  Patient/Guardian was advised Release of Information must be obtained prior to any record release in order to collaborate their care with an outside provider. Patient/Guardian was advised if they have not already  done so to contact the registration department to sign all necessary forms in order for [place] to release information regarding their care.   Consent: Patient/Guardian gives verbal consent for treatment and assignment of benefits for services provided during this visit. Patient/Guardian expressed understanding and agreed to proceed.   Lynnell Chad, LCSW 05/25/2022

## 2022-06-13 ENCOUNTER — Encounter (HOSPITAL_COMMUNITY): Payer: Self-pay | Admitting: Clinical

## 2022-06-13 ENCOUNTER — Ambulatory Visit (INDEPENDENT_AMBULATORY_CARE_PROVIDER_SITE_OTHER): Payer: 59 | Admitting: Clinical

## 2022-06-13 DIAGNOSIS — F330 Major depressive disorder, recurrent, mild: Secondary | ICD-10-CM | POA: Diagnosis not present

## 2022-06-13 DIAGNOSIS — F411 Generalized anxiety disorder: Secondary | ICD-10-CM

## 2022-06-13 DIAGNOSIS — Z8659 Personal history of other mental and behavioral disorders: Secondary | ICD-10-CM | POA: Diagnosis not present

## 2022-06-13 NOTE — Progress Notes (Signed)
THERAPIST PROGRESS NOTE  Session Time: 9:10-10:03am  Session #3  Participation Level: Active  Behavioral Response: Casual Alert Negative and Hopeless  Type of Therapy: Individual Therapy  Treatment Goals addressed:  STG: Report a decrease in anxiety symptoms as evidenced by an overall reduction in anxiety score to no higher than 5 on the Generalized Anxiety Disorder Scale (GAD-7) STG: Kaymarie will participate in at least 80% of scheduled individual psychotherapy sessions STG: Reduce overall depression score to no higher than 5 on the Patient Health Questionnaire (PHQ-9) LTG: Patient will be able to identify the reasons she overeats and will learn to sit with her feelings before starting to eat.  ProgressTowards Goals: Progressing  Interventions: CBT, Assertiveness Training, and Supportive  Summary: Cheryl Ayala is a 37 y.o. female who presents with anxiety and depression for therapy.  She reported having a good week and Mother's Day.  She had been convinced her husband was not going to allow her to have time to herself for Mother's Day, but he did so without complaint.  She was upset because since last session, the twins climbed over a kid gate while her husband was asleep, went into the basement which is rented out to a friend and splashed vinegar all over the floor.  She worries because husband only called them back upstairs, did not go downstairs to look, saying that if they had drunk bleach it would have had a different outcome.  She remains very hesitant to express her real feelings to husband.  She was able to identify that the problem would not have happened if a door had not been removed for some reason, made a commitment to getting that door back up so that no matter who is living in the basement, the twins cannot get in to create such a mess or endanger themselves.  Most of the session was spent in her talking about a situation with a friend who rents their basement, with  husband wanting the friend to move out but patient being convinced they need the income to make ends meet.  He believes, and patient also suspects, that the friend may have stolen from them some.  Over the years, a number of family members and friends have rented out the basement, and the current renter has been there almost 5 years.  This really highlights the differences between herself and her husband because he wants the person out of their home and wants to use the basement as their own space for their own needs.  She stated his current "man cave" is "disgusting" and she therefore likes the fact that her friend being there keeps her husband from taking an even larger area and turning it into a disgusting area.  She would like it to become either their twins' play area or the master bedroom for herself and husband.  Again, expressing the fear that he would trash the room was not something she feels she could talk to him about.  Patient stated that she feels numb much of the time, and she wonders if it is the medicine making her feel nothing.  CSW recommended that she make a point of talking to provider about this sensation, told her that the hope with psychiatric medication is that a person is able to be their true selves.  She stated she just feels "blah" all the time.  She truly wonders if something is wrong with her feeling level, because when her grandma passed away, she was not sad and  has not been sad since then.  We talked about how grief is experienced by each different person in a different way.  Finally we talked about patient's overeating.  She admitted that she binges and said that she chews ice a lot of the time in hopes of satisfying her oral fixation.  She was provided information about the group Overeaters Anonymous again and told about it in more detail, specifically that people in such a 12-state program are looking for and working toward emotional, physical and spiritual recovery.  She  found this interesting but was not ready to commit to going to any meetings yet.  Suicidal/Homicidal: No without intent/plan  Therapist Response: Patient is making progress AEB engaging in the therapy session fully and being honest about the things bothering her.  We talked at length about ways she can speak to her husband and her renter separately to try to ease the situation between them, as well as to not feel so torn between them or taken advantage of.  At this point they really need the money to help.  CSW provided ways she could talk to her husband about his need to pick up more hours so that they can support themselves without the renter's financial contributions.  CSW provided empathy, active listening, open body language and unconditional positive regard.   Patient demonstrated an orientation to time, place, person and situation.     Recommendations:  Return to therapy in 3 weeks, engage in self care behaviors, continue to use "I" statements in talks with husband and add renter/family members, make appointment to see psychiatric provider  Plan: Return again in 3 weeks. Next appointment:  6/26  Diagnosis:  Major depressive disorder, recurrent episode, mild (HCC)  Generalized anxiety disorder  History of attention deficit hyperactivity disorder (ADHD)  Collaboration of Care: Psychiatrist AEB -  referred patient for psychiatric appointment  Patient/Guardian was advised Release of Information must be obtained prior to any record release in order to collaborate their care with an outside provider. Patient/Guardian was advised if they have not already done so to contact the registration department to sign all necessary forms in order for [place] to release information regarding their care.   Consent: Patient/Guardian gives verbal consent for treatment and assignment of benefits for services provided during this visit. Patient/Guardian expressed understanding and agreed to proceed.   Lynnell Chad, LCSW 06/13/2022

## 2022-06-20 ENCOUNTER — Telehealth: Payer: 59 | Admitting: Nurse Practitioner

## 2022-06-20 ENCOUNTER — Other Ambulatory Visit (HOSPITAL_COMMUNITY): Payer: Self-pay

## 2022-06-20 DIAGNOSIS — B3731 Acute candidiasis of vulva and vagina: Secondary | ICD-10-CM | POA: Diagnosis not present

## 2022-06-20 MED ORDER — FLUCONAZOLE 150 MG PO TABS
150.0000 mg | ORAL_TABLET | Freq: Once | ORAL | 0 refills | Status: AC
Start: 2022-06-20 — End: 2022-06-21
  Filled 2022-06-20: qty 1, 1d supply, fill #0

## 2022-06-20 NOTE — Progress Notes (Signed)

## 2022-06-25 ENCOUNTER — Encounter: Payer: Self-pay | Admitting: Nurse Practitioner

## 2022-06-25 ENCOUNTER — Ambulatory Visit (INDEPENDENT_AMBULATORY_CARE_PROVIDER_SITE_OTHER): Payer: 59 | Admitting: Nurse Practitioner

## 2022-06-25 VITALS — BP 120/82 | HR 70 | Temp 97.5°F | Resp 16 | Ht 67.0 in | Wt 320.4 lb

## 2022-06-25 DIAGNOSIS — E785 Hyperlipidemia, unspecified: Secondary | ICD-10-CM

## 2022-06-25 DIAGNOSIS — D500 Iron deficiency anemia secondary to blood loss (chronic): Secondary | ICD-10-CM

## 2022-06-25 DIAGNOSIS — E1165 Type 2 diabetes mellitus with hyperglycemia: Secondary | ICD-10-CM | POA: Diagnosis not present

## 2022-06-25 DIAGNOSIS — I10 Essential (primary) hypertension: Secondary | ICD-10-CM | POA: Diagnosis not present

## 2022-06-25 DIAGNOSIS — F331 Major depressive disorder, recurrent, moderate: Secondary | ICD-10-CM

## 2022-06-25 DIAGNOSIS — E1169 Type 2 diabetes mellitus with other specified complication: Secondary | ICD-10-CM

## 2022-06-25 DIAGNOSIS — Z794 Long term (current) use of insulin: Secondary | ICD-10-CM | POA: Diagnosis not present

## 2022-06-25 DIAGNOSIS — Z7985 Long-term (current) use of injectable non-insulin antidiabetic drugs: Secondary | ICD-10-CM

## 2022-06-25 DIAGNOSIS — E559 Vitamin D deficiency, unspecified: Secondary | ICD-10-CM | POA: Diagnosis not present

## 2022-06-25 LAB — CBC
HCT: 33.6 % — ABNORMAL LOW (ref 36.0–46.0)
Hemoglobin: 10.4 g/dL — ABNORMAL LOW (ref 12.0–15.0)
MCHC: 30.8 g/dL (ref 30.0–36.0)
MCV: 65.8 fl — ABNORMAL LOW (ref 78.0–100.0)
Platelets: 494 10*3/uL — ABNORMAL HIGH (ref 150.0–400.0)
RBC: 5.11 Mil/uL (ref 3.87–5.11)
RDW: 19.1 % — ABNORMAL HIGH (ref 11.5–15.5)
WBC: 9.2 10*3/uL (ref 4.0–10.5)

## 2022-06-25 LAB — IBC + FERRITIN
Ferritin: 9.7 ng/mL — ABNORMAL LOW (ref 10.0–291.0)
Iron: 25 ug/dL — ABNORMAL LOW (ref 42–145)
Saturation Ratios: 5.5 % — ABNORMAL LOW (ref 20.0–50.0)
TIBC: 453.6 ug/dL — ABNORMAL HIGH (ref 250.0–450.0)
Transferrin: 324 mg/dL (ref 212.0–360.0)

## 2022-06-25 LAB — VITAMIN D 25 HYDROXY (VIT D DEFICIENCY, FRACTURES): VITD: 24.7 ng/mL — ABNORMAL LOW (ref 30.00–100.00)

## 2022-06-25 LAB — LDL CHOLESTEROL, DIRECT: Direct LDL: 113 mg/dL

## 2022-06-25 LAB — HEMOGLOBIN A1C: Hgb A1c MFr Bld: 6.2 % (ref 4.6–6.5)

## 2022-06-25 NOTE — Assessment & Plan Note (Signed)
Repeat vit. D °

## 2022-06-25 NOTE — Assessment & Plan Note (Signed)
Controlled with mounjaro Reports mild constipation but resolved with miralax Agreed to schedule DIABETES eye exam with mobile clinic  Repeat hgbA1c and direct LDL today Maintain med dose F/up in 3months

## 2022-06-25 NOTE — Patient Instructions (Addendum)
Return to office for diabetic eye exam October 17th Go to lab Maintain current med doses Increase exercise to per week Maintain heart healthy diet.

## 2022-06-25 NOTE — Assessment & Plan Note (Signed)
BP at goal with metoprolol and metoprolol BP Readings from Last 3 Encounters:  06/25/22 120/82  04/09/22 125/87  03/29/22 122/80    Maintain med doses  Refills sent

## 2022-06-25 NOTE — Assessment & Plan Note (Signed)
She decided to stop wellbutrin 75mg BID but her mood did not improve. She want to wait for upcoming appointment with psychiatry 07/16/22 before taking any additional medication. She has continue CBT sessions EOW

## 2022-06-25 NOTE — Assessment & Plan Note (Signed)
Repeat direct LDL Advised about importance of diet and exercise modification

## 2022-06-25 NOTE — Progress Notes (Signed)
Established Patient Visit  Patient: Cheryl Ayala   DOB: 1986/01/09   37 y.o. Female  MRN: [id number] Visit Date: 06/25/2022  Subjective:    Chief Complaint  Patient presents with   Medical Management of Chronic Issues   HPI Morbid obesity (HCC) No change in diet, butDecreased meal portion with use of mounjaro. Exercise: walking 2x/week in park with kids. Lost 10lbs with use of mounjaro 5mg  weekly Wt Readings from Last 3 Encounters:  06/25/22 (!) 320 lb 6.4 oz (145.3 kg)  03/29/22 (!) 330 lb 12.8 oz (150 kg)  02/23/22 (!) 346 lb (156.9 kg)    Encourage to maintain low carb/low sugar/high fiber/high protein diet Encourage in maintain weekly of exercise Maintain med dose F/up in 14month  Benign essential hypertension BP at goal with metoprolol and metoprolol BP Readings from Last 3 Encounters:  06/25/22 120/82  04/09/22 125/87  03/29/22 122/80    Maintain med doses  Refills sent  Hyperlipidemia associated with type 2 diabetes mellitus (HCC) Repeat direct LDL Advised about importance of diet and exercise modification  Iron deficiency anemia due to chronic blood loss Reports hair loss Repeat cbc and iron No OVER THE COUNTER iron supplement IUD-mirena in place  Moderate episode of recurrent major depressive disorder (HCC) She decided to stop wellbutrin 75mg BID but her mood did not improve. She want to wait for upcoming appointment with psychiatry 07/16/22 before taking any additional medication. She has continue CBT sessions EOW  Type 2 diabetes mellitus with hyperglycemia, with long-term current use of insulin (HCC) Controlled with mounjaro Reports mild constipation but resolved with miralax Agreed to schedule DIABETES eye exam with mobile clinic  Repeat hgbA1c and direct LDL today Maintain med dose F/up in 14months  Vitamin D deficiency Repeat vit. D   Reviewed medical, surgical, and social history today  Medications: Outpatient  Medications Prior to Visit  Medication Sig   amLODipine (NORVASC) 10 MG tablet Take 1 tablet (10 mg total) by mouth daily.   blood glucose meter kit and supplies Dispense based on patient and insurance preference. Use once a day before breakfast. ICD10:E11.65   Blood Glucose Monitoring Suppl (FREESTYLE LITE) w/Device KIT Use to directed to test blood sugar once a day befor breakfast   glucose blood test strip Use as directed to check blood sugar once a day before breakfast   hydrocortisone (ANUSOL-HC) 25 MG suppository Insert 1 suppository rectally twice a day for 14 days.   Lancets (FREESTYLE) lancets Use as directed to check blood sugar once a day before breakfast   levonorgestrel (MIRENA) 20 MCG/DAY IUD 1 each by Intrauterine route once.   metoprolol succinate (TOPROL-XL) 100 MG 24 hr tablet Take 1 tablet (100 mg total) by mouth daily. Take with or immediately following a meal.   pantoprazole (PROTONIX) 40 MG tablet Take 1 tablet (40 mg total) by mouth daily.   tirzepatide [facility]) 5 MG/0.5ML Pen Inject 5 mg into the skin once a week.   Vitamin D, Ergocalciferol, (DRISDOL) 1.25 MG (50000 UNIT) CAPS capsule Take 1 capsule (50,000 Units total) by mouth every 7 (seven) days.   [DISCONTINUED] buPROPion (WELLBUTRIN) 75 MG tablet Take 1 tablet (75 mg total) by mouth daily.   [DISCONTINUED] doxycycline (VIBRA-TABS) 100 MG tablet Take 1 tablet (100 mg total) by mouth 2 (two) times daily.   [DISCONTINUED] fluconazole (DIFLUCAN) 150 MG tablet Take 1 tablet (150 mg total) by mouth every 3 (  three) days as needed.   [DISCONTINUED] nitrofurantoin, macrocrystal-monohydrate, (MACROBID) 100 MG capsule Take 1 capsule (100 mg total) by mouth 2 (two) times daily.   No facility-administered medications prior to visit.   Reviewed past medical and social history.   ROS per HPI above      Objective:  BP 120/82 (BP Location: Left Arm, Patient Position: Sitting, Cuff Size: Large)   Pulse 70   Temp (!) 97.5  F (36.4 C) (Temporal)   Resp 16   Ht 5\' 7"  (1.702 m)   Wt (!) 320 lb 6.4 oz (145.3 kg)   SpO2 97%   BMI 50.18 kg/m      Physical Exam  No results found for any visits on 06/25/22.    Assessment & Plan:    Problem List Items Addressed This Visit       Cardiovascular and Mediastinum   Benign essential hypertension - Primary    BP at goal with metoprolol and metoprolol BP Readings from Last 3 Encounters:  06/25/22 120/82  04/09/22 125/87  03/29/22 122/80    Maintain med doses  Refills sent        Endocrine   Hyperlipidemia associated with type 2 diabetes mellitus (HCC)    Repeat direct LDL Advised about importance of diet and exercise modification      Relevant Orders   Direct LDL   Type 2 diabetes mellitus with hyperglycemia, with long-term current use of insulin (HCC)    Controlled with mounjaro Reports mild constipation but resolved with miralax Agreed to schedule DIABETES eye exam with mobile clinic  Repeat hgbA1c and direct LDL today Maintain med dose F/up in 47months      Relevant Orders   Hemoglobin A1c   Microalbumin / creatinine urine ratio     Other   Iron deficiency anemia due to chronic blood loss    Reports hair loss Repeat cbc and iron No OVER THE COUNTER iron supplement IUD-mirena in place      Relevant Orders   IBC + Ferritin   CBC   Moderate episode of recurrent major depressive disorder (HCC)    She decided to stop wellbutrin 75mg BID but her mood did not improve. She want to wait for upcoming appointment with psychiatry 07/16/22 before taking any additional medication. She has continue CBT sessions EOW      Morbid obesity (HCC)    No change in diet, butDecreased meal portion with use of mounjaro. Exercise: walking 2x/week in park with kids. Lost 10lbs with use of mounjaro 5mg  weekly Wt Readings from Last 3 Encounters:  06/25/22 (!) 320 lb 6.4 oz (145.3 kg)  03/29/22 (!) 330 lb 12.8 oz (150 kg)  02/23/22 (!) 346 lb (156.9 kg)     Encourage to maintain low carb/low sugar/high fiber/high protein diet Encourage in maintain weekly of exercise Maintain med dose F/up in 47month      Vitamin D deficiency    Repeat vit. D      Relevant Orders   VITAMIN D 25 Hydroxy (Vit-D Deficiency, Fractures)   Other Visit Diagnoses     Long-term (current) use of injectable non-insulin antidiabetic drugs       Relevant Orders   Microalbumin / creatinine urine ratio      Return in about 3 months (around 09/25/2022) for HTN, DM, hyperlipidemia (fasting).     Alysia Penna, NP

## 2022-06-25 NOTE — Assessment & Plan Note (Signed)
No change in diet, butDecreased meal portion with use of mounjaro. Exercise: walking 2x/week in park with kids. Lost 10lbs with use of mounjaro 5mg  weekly Wt Readings from Last 3 Encounters:  06/25/22 (!) 320 lb 6.4 oz (145.3 kg)  03/29/22 (!) 330 lb 12.8 oz (150 kg)  02/23/22 (!) 346 lb (156.9 kg)    Encourage to maintain low carb/low sugar/high fiber/high protein diet Encourage in maintain weekly of exercise Maintain med dose F/up in 68month

## 2022-06-25 NOTE — Assessment & Plan Note (Addendum)
Reports hair loss Repeat cbc and iron No OVER THE COUNTER iron supplement IUD-mirena in place

## 2022-06-26 ENCOUNTER — Other Ambulatory Visit: Payer: Self-pay | Admitting: Nurse Practitioner

## 2022-06-26 ENCOUNTER — Encounter: Payer: Self-pay | Admitting: Nurse Practitioner

## 2022-06-26 DIAGNOSIS — E1165 Type 2 diabetes mellitus with hyperglycemia: Secondary | ICD-10-CM

## 2022-06-26 DIAGNOSIS — E1169 Type 2 diabetes mellitus with other specified complication: Secondary | ICD-10-CM

## 2022-06-26 NOTE — Addendum Note (Signed)
Addended by: Alysia Penna L on: 06/26/2022 03:09 PM   Modules accepted: Orders

## 2022-07-11 ENCOUNTER — Ambulatory Visit (INDEPENDENT_AMBULATORY_CARE_PROVIDER_SITE_OTHER): Payer: 59 | Admitting: Clinical

## 2022-07-11 DIAGNOSIS — F330 Major depressive disorder, recurrent, mild: Secondary | ICD-10-CM

## 2022-07-11 DIAGNOSIS — F411 Generalized anxiety disorder: Secondary | ICD-10-CM | POA: Diagnosis not present

## 2022-07-11 DIAGNOSIS — Z8659 Personal history of other mental and behavioral disorders: Secondary | ICD-10-CM
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
## 2022-07-12 ENCOUNTER — Encounter (HOSPITAL_COMMUNITY): Payer: Self-pay | Admitting: Clinical

## 2022-07-16 ENCOUNTER — Ambulatory Visit (HOSPITAL_BASED_OUTPATIENT_CLINIC_OR_DEPARTMENT_OTHER): Payer: 59 | Admitting: Student

## 2022-07-16 ENCOUNTER — Other Ambulatory Visit (HOSPITAL_COMMUNITY): Payer: Self-pay

## 2022-07-16 ENCOUNTER — Encounter (HOSPITAL_COMMUNITY): Payer: Self-pay | Admitting: Student

## 2022-07-16 VITALS — BP 138/84 | HR 68

## 2022-07-16 DIAGNOSIS — Z8659 Personal history of other mental and behavioral disorders: Secondary | ICD-10-CM | POA: Diagnosis not present

## 2022-07-16 DIAGNOSIS — F419 Anxiety disorder, unspecified: Secondary | ICD-10-CM | POA: Diagnosis not present

## 2022-07-16 MED ORDER — ATOMOXETINE HCL 40 MG PO CAPS
40.0000 mg | ORAL_CAPSULE | Freq: Every day | ORAL | 1 refills | Status: DC
  Filled 2022-07-16: qty 30, 30d supply, fill #0

## 2022-07-16 MED ORDER — BUSPIRONE HCL 15 MG PO TABS
15.0000 mg | ORAL_TABLET | Freq: Every day | ORAL | 1 refills | Status: AC
  Filled 2022-07-16: qty 30, 30d supply, fill #0

## 2022-07-16 NOTE — Addendum Note (Signed)
Addended by: Everlena Cooper on: 07/16/2022 04:42 PM   Modules accepted: Level of Service
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
## 2022-07-18 ENCOUNTER — Other Ambulatory Visit (HOSPITAL_COMMUNITY): Payer: Self-pay

## 2022-07-23 ENCOUNTER — Ambulatory Visit (HOSPITAL_COMMUNITY): Payer: 59 | Admitting: Clinical

## 2022-07-30 ENCOUNTER — Inpatient Hospital Stay: Payer: 59 | Admitting: Hematology and Oncology

## 2022-07-30 ENCOUNTER — Inpatient Hospital Stay: Payer: 59

## 2022-07-30 ENCOUNTER — Encounter: Payer: Self-pay | Admitting: Hematology and Oncology

## 2022-07-30 ENCOUNTER — Other Ambulatory Visit: Payer: Self-pay

## 2022-07-30 VITALS — BP 126/59 | HR 70 | Temp 98.0°F | Wt 322.2 lb

## 2022-07-30 DIAGNOSIS — Z8349 Family history of other endocrine, nutritional and metabolic diseases: Secondary | ICD-10-CM | POA: Diagnosis not present

## 2022-07-30 DIAGNOSIS — F5089 Other specified eating disorder: Secondary | ICD-10-CM | POA: Insufficient documentation

## 2022-07-30 DIAGNOSIS — Z9049 Acquired absence of other specified parts of digestive tract: Secondary | ICD-10-CM | POA: Insufficient documentation

## 2022-07-30 DIAGNOSIS — Z818 Family history of other mental and behavioral disorders: Secondary | ICD-10-CM | POA: Diagnosis not present

## 2022-07-30 DIAGNOSIS — Z8742 Personal history of other diseases of the female genital tract: Secondary | ICD-10-CM | POA: Insufficient documentation

## 2022-07-30 DIAGNOSIS — Z809 Family history of malignant neoplasm, unspecified: Secondary | ICD-10-CM | POA: Diagnosis not present

## 2022-07-30 DIAGNOSIS — Z79899 Other long term (current) drug therapy: Secondary | ICD-10-CM | POA: Insufficient documentation

## 2022-07-30 DIAGNOSIS — Z882 Allergy status to sulfonamides status: Secondary | ICD-10-CM | POA: Insufficient documentation

## 2022-07-30 DIAGNOSIS — I10 Essential (primary) hypertension: Secondary | ICD-10-CM | POA: Insufficient documentation

## 2022-07-30 DIAGNOSIS — D500 Iron deficiency anemia secondary to blood loss (chronic): Secondary | ICD-10-CM | POA: Diagnosis not present

## 2022-07-30 DIAGNOSIS — F419 Anxiety disorder, unspecified: Secondary | ICD-10-CM | POA: Diagnosis not present

## 2022-07-30 DIAGNOSIS — Z793 Long term (current) use of hormonal contraceptives: Secondary | ICD-10-CM | POA: Insufficient documentation

## 2022-07-30 DIAGNOSIS — Z8249 Family history of ischemic heart disease and other diseases of the circulatory system: Secondary | ICD-10-CM | POA: Insufficient documentation

## 2022-07-30 DIAGNOSIS — Z8759 Personal history of other complications of pregnancy, childbirth and the puerperium: Secondary | ICD-10-CM | POA: Diagnosis not present

## 2022-07-30 DIAGNOSIS — N920 Excessive and frequent menstruation with regular cycle: Secondary | ICD-10-CM | POA: Diagnosis not present

## 2022-07-30 DIAGNOSIS — Z823 Family history of stroke: Secondary | ICD-10-CM | POA: Diagnosis not present

## 2022-07-30 DIAGNOSIS — K909 Intestinal malabsorption, unspecified: Secondary | ICD-10-CM | POA: Insufficient documentation

## 2022-07-30 DIAGNOSIS — Z880 Allergy status to penicillin: Secondary | ICD-10-CM | POA: Diagnosis not present

## 2022-07-30 DIAGNOSIS — R5383 Other fatigue: Secondary | ICD-10-CM | POA: Diagnosis not present

## 2022-07-30 DIAGNOSIS — L659 Nonscarring hair loss, unspecified: Secondary | ICD-10-CM | POA: Insufficient documentation

## 2022-07-30 NOTE — Assessment & Plan Note (Signed)
She has history of menorrhagia, currently well-controlled with Mirena IUD If she has recurrent menorrhagia causing iron deficiency anemia, she should be evaluated by gynecologist to exclude uterine fibroids

## 2022-07-30 NOTE — Assessment & Plan Note (Signed)
The most likely cause of her anemia is due to chronic blood loss/malabsorption syndrome. We discussed some of the risks, benefits, and alternatives of intravenous iron infusions. The patient is symptomatic from anemia and the iron level is critically low. She tolerated oral iron supplement poorly and desires to achieved higher levels of iron faster for adequate hematopoesis. Some of the side-effects to be expected including risks of infusion reactions, phlebitis, headaches, nausea and fatigue.  The patient is willing to proceed. Patient education material was dispensed.  Goal is to keep ferritin level greater than 50 and resolution of anemia Recommend 2 doses of intravenous iron sucrose 400 mg She has numerous appointment coming up in the future to follow-up with her primary care doctor and I recommend repeat labs with iron studies approximately a month after her last dose of treatment I also recommend the patient to take a multivitamin supplement for effective erythropoiesis

## 2022-07-30 NOTE — Progress Notes (Signed)
[facility] CONSULT NOTE  Patient Care Team: Nche, Bonna Gains, NP as PCP - General (Internal Medicine)  ASSESSMENT & PLAN:  Iron deficiency anemia due to chronic blood loss The most likely cause of her anemia is due to chronic blood loss/malabsorption syndrome. We discussed some of the risks, benefits, and alternatives of intravenous iron infusions. The patient is symptomatic from anemia and the iron level is critically low. She tolerated oral iron supplement poorly and desires to achieved higher levels of iron faster for adequate hematopoesis. Some of the side-effects to be expected including risks of infusion reactions, phlebitis, headaches, nausea and fatigue.  The patient is willing to proceed. Patient education material was dispensed.  Goal is to keep ferritin level greater than 50 and resolution of anemia Recommend 2 doses of intravenous iron sucrose 400 mg She has numerous appointment coming up in the future to follow-up with her primary care doctor and I recommend repeat labs with iron studies approximately a month after her last dose of treatment I also recommend the patient to take a multivitamin supplement for effective erythropoiesis   Hair loss Her hair loss is likely due to iron deficiency It will improve with adequate intravenous iron infusion  History of menorrhagia She has history of menorrhagia, currently well-controlled with Mirena IUD If she has recurrent menorrhagia causing iron deficiency anemia, she should be evaluated by gynecologist to exclude uterine fibroids No orders of the defined types were placed in this encounter.   All questions were answered. The patient knows to call the clinic with any problems, questions or concerns.  The total time spent in the appointment was 60 minutes encounter with patients including review of chart and various tests results, discussions about plan of care and coordination of care plan  Artis Delay,  MD 7/15/202410:49 AM   CHIEF COMPLAINTS/PURPOSE OF CONSULTATION:  Anemia  HISTORY OF PRESENTING ILLNESS:  Cheryl Ayala 37 y.o. female is here because of anemia and excessive fatigue  She was found to have abnormal CBC from recent blood work I have the opportunity to review his CBC all the way to 2019 In June 2019, she has normal hemoglobin of 12.3 Over the years, she is noted to have microcytic anemia, confirmed to be iron deficiency Her labs dated June 25, 2022 show hemoglobin of 10.4, MCV of 65.8 and platelet count of 494 with ferritin 9.7 with iron saturation at 5.5% She denies recent chest pain on exertion, shortness of breath on minimal exertion, pre-syncopal episodes, or palpitations. She complained of excessive fatigue, taking care of her young twin boys She had not noticed any recent bleeding such as epistaxis, hematuria or hematochezia The patient denies over the counter NSAID ingestion. She is not on antiplatelets agents.  She had no prior history or diagnosis of cancer. Her age appropriate screening programs are up-to-date. She has pica with ice craving; she eats a variety of diet. She never donated blood or received blood transfusion The patient was prescribed oral iron supplements and she takes 1 daily in the past but stopped due to excessive constipation. She had received intravenous iron infusion in 2021 and tolerated that very well  MEDICAL HISTORY:  Past Medical History:  Diagnosis Date   Anovulation 05/19/2015   Anxiety    Esophageal dysphagia    GERD (gastroesophageal reflux disease)    Hypersomnia 12/23/2015   Hypertension    Plantar fasciitis    Preterm premature rupture of membranes 01/05/2020   Venous insufficiency  SURGICAL HISTORY: Past Surgical History:  Procedure Laterality Date   CESAREAN SECTION MULTI-GESTATIONAL N/A 01/25/2020   Procedure: CESAREAN SECTION MULTI-GESTATIONAL;  Surgeon: Mitchel Honour, DO;  Location: [facility] ORS;  Service:  Obstetrics;  Laterality: N/A;   CHOLECYSTECTOMY, LAPAROSCOPIC  04/09/2019   ESOPHAGEAL MANOMETRY N/A 09/25/2017   Procedure: ESOPHAGEAL MANOMETRY (EM);  Surgeon: Napoleon Form, MD;  Location: [facility]OSCOPY;  Service: Endoscopy;  Laterality: N/A;   IVF     PLANTAR FASCIA RELEASE Bilateral    2019 and 2020   WISDOM TOOTH EXTRACTION  2013    SOCIAL HISTORY: Social History   Socioeconomic History   Marital status: Married    Spouse name: Not on file   Number of children: Not on file   Years of education: Not on file   Highest education level: Not on file  Occupational History   Not on file  Tobacco Use   Smoking status: Never   Smokeless tobacco: Never  Vaping Use   Vaping status: Never Used  Substance and Sexual Activity   Alcohol use: Yes    Comment: once every 6 months    Drug use: Never   Sexual activity: Yes  Other Topics Concern   Not on file  Social History Narrative   Not on file   Social Determinants of Health   Financial Resource Strain: Not on file  Food Insecurity: Not on file  Transportation Needs: Not on file  Physical Activity: Not on file  Stress: Not on file  Social Connections: Not on file  Intimate Partner Violence: Not on file    FAMILY HISTORY: Family History  Problem Relation Age of Onset   Cancer Mother    Hypertension Father    Hyperlipidemia Father    Anxiety disorder Father    Depression Father    Heart disease Father    AAA (abdominal aortic aneurysm) Father    Depression Sister    Anxiety disorder Sister    Heart disease Maternal Grandmother    Alzheimer's disease Maternal Grandmother    Stroke Maternal Grandmother    Gout Maternal Grandfather    Heart disease Paternal Grandfather     ALLERGIES:  is allergic to penicillins and sulfa antibiotics.  MEDICATIONS:  Current Outpatient Medications  Medication Sig Dispense Refill   amLODipine (NORVASC) 10 MG tablet Take 1 tablet (10 mg total) by mouth daily. 90 tablet 3    atomoxetine (STRATTERA) 40 MG capsule Take 1 capsule (40 mg) by mouth daily. 30 capsule 1   blood glucose meter kit and supplies Dispense based on patient and insurance preference. Use once a day before breakfast. ICD10:E11.65 1 each 0   Blood Glucose Monitoring Suppl (FREESTYLE LITE) w/Device KIT Use to directed to test blood sugar once a day befor breakfast 1 kit 0   busPIRone (BUSPAR) 15 MG tablet Take 1 tablet (15 mg) by mouth daily. 30 tablet 1   glucose blood test strip Use as directed to check blood sugar once a day before breakfast 100 each 0   hydrocortisone (ANUSOL-HC) 25 MG suppository Insert 1 suppository rectally twice a day for 14 days. 28 suppository 0   Lancets (FREESTYLE) lancets Use as directed to check blood sugar once a day before breakfast 100 each 0   levonorgestrel (MIRENA) 20 MCG/DAY IUD 1 each by Intrauterine route once.     metoprolol succinate (TOPROL-XL) 100 MG 24 hr tablet Take 1 tablet (100 mg total) by mouth daily. Take with or immediately following a meal.  90 tablet 3   pantoprazole (PROTONIX) 40 MG tablet Take 1 tablet (40 mg total) by mouth daily. 90 tablet 3   tirzepatide (MOUNJARO) 5 MG/0.5ML Pen Inject 5 mg into the skin once a week. 6 mL 1   No current facility-administered medications for this visit.    REVIEW OF SYSTEMS:   Constitutional: Denies fevers, chills or abnormal night sweats Eyes: Denies blurriness of vision, double vision or watery eyes Ears, nose, mouth, throat, and face: Denies mucositis or sore throat Respiratory: Denies cough, dyspnea or wheezes Cardiovascular: Denies palpitation, chest discomfort or lower extremity swelling Gastrointestinal:  Denies nausea, heartburn or change in bowel habits Skin: Denies abnormal skin rashes Lymphatics: Denies new lymphadenopathy or easy bruising Neurological:Denies numbness, tingling or new weaknesses Behavioral/Psych: Mood is stable, no new changes  All other systems were reviewed with the patient  and are negative.  PHYSICAL EXAMINATION: ECOG PERFORMANCE STATUS: 1 - Symptomatic but completely ambulatory  Vitals:   07/30/22 1016  BP: (!) 126/59  Pulse: 70  Temp: 98 F (36.7 C)  SpO2: 99%   Filed Weights   07/30/22 1016  Weight: (!) 322 lb 3.2 oz (146.1 kg)    GENERAL:alert, no distress and comfortable SKIN: skin color, texture, turgor are normal, no rashes or significant lesions EYES: normal, conjunctiva are pink and non-injected, sclera clear OROPHARYNX:no exudate, no erythema and lips, buccal mucosa, and tongue normal  NECK: supple, thyroid normal size, non-tender, without nodularity LYMPH:  no palpable lymphadenopathy in the cervical, axillary or inguinal LUNGS: clear to auscultation and percussion with normal breathing effort HEART: regular rate & rhythm and no murmurs and no lower extremity edema ABDOMEN:abdomen soft, non-tender and normal bowel sounds Musculoskeletal:no cyanosis of digits and no clubbing  PSYCH: alert & oriented x 3 with fluent speech NEURO: no focal motor/sensory deficits

## 2022-07-30 NOTE — Assessment & Plan Note (Signed)
Her hair loss is likely due to iron deficiency It will improve with adequate intravenous iron infusion

## 2022-08-06 ENCOUNTER — Encounter (HOSPITAL_COMMUNITY): Payer: Self-pay | Admitting: Clinical

## 2022-08-06 ENCOUNTER — Ambulatory Visit (HOSPITAL_COMMUNITY): Payer: 59 | Admitting: Clinical

## 2022-08-06 DIAGNOSIS — F411 Generalized anxiety disorder: Secondary | ICD-10-CM | POA: Diagnosis not present

## 2022-08-06 DIAGNOSIS — Z8659 Personal history of other mental and behavioral disorders: Secondary | ICD-10-CM

## 2022-08-06 DIAGNOSIS — F330 Major depressive disorder, recurrent, mild: Secondary | ICD-10-CM

## 2022-08-06 NOTE — Progress Notes (Unsigned)
THERAPIST PROGRESS NOTE  Session Time: 4:03pm-5:01pm  Session #5  Participation Level: Active  Behavioral Response: Casual Alert Anxious  Type of Therapy: Individual Therapy  Treatment Goals addressed:  STG: Report a decrease in anxiety symptoms as evidenced by an overall reduction in anxiety score to no higher than 5 on the Generalized Anxiety Disorder Scale (GAD-7) STG: Alis will participate in at least 80% of scheduled individual psychotherapy sessions STG: Reduce overall depression score to no higher than 5 on the Patient Health Questionnaire (PHQ-9) LTG: Patient will be able to identify the reasons she overeats and will learn to sit with her feelings before starting to eat.  ProgressTowards Goals: Progressing  Interventions: CBT, Assertiveness Training, and Supportive  Summary: Cheryl Ayala is a 37 y.o. female who presents with anxiety and depression for therapy.  She reported that she has had insomnia several nights recently, including last night, when her mind raced about various concerning topics to the extent she could not go to sleep.  We processed again the ongoing issues with feeling unable to talk with her husband about deeper issues.  These include, currently, things like if he takes a trip to [place] with a friend, they will have to defer car payment or he will have to work extra shifts at the job.  She knows she needs to tell him but is scared to do so.  This was processed, is linked to past issues where she has run up their credit card bills and she has residual guilt from that.  She reports that she knows she needs to bring it up and she even will plan what she is going to say, but then the words will not come out.  They are going on a family vacation starting Saturday of this week, so she said she would "ambush" him with this during the car ride.  CSW dissuaded her from doing this because he does not need to be upset while he is driving the car.  She reports  that some of this behavior was learned from her mother doing the same thing.  Ultimately she concluded that the tendency to be in denial about financial issues comes from her fear of the outcome and from her low self-worth.  There have been times they tried to handle the finances together, but she would get upset with him and would take it back over.  She was encouraged to write to him if she cannot speak the words, but also reminded that he has no choice about upcoming plans/trips/extra shifts if he is not told about the situation; therefore, by telling him she is empowering him to participate.  Suicidal/Homicidal: No without intent/plan  Therapist Response:   Patient is progressing AEB engaging in scheduled therapy session.  She presented oriented x5 and stated she was feeling "tired and stressed."  CSW evaluated patient's medication compliance and self-care since last session.  She reported that she has stopped taking Buspar because it was consistently making her dizzy.  CSW reviewed the last session with patient who reported she has tried using more "I" statements.  Patient did not demonstrate a good understanding, so this was described again.  We processed how her marriage feels unsafe to her at the same time that she does not believe her husband is an unsafe person, that it is more linked to her low self-esteem and low self-worth.  She remains committed to doing the majority of the work in the relationship because she fears he will  leave her, even though this has never actually been an issue. CSW encouraged patient to schedule more therapy sessions for the future, as there is only 1 more scheduled at this time.  Throughout the session, CSW gave patient the opportunity to explore thoughts and feelings associated with current life situations and past/present external stressors.   CSW encouraged patient's expression of feelings and validated patient's thoughts using empathy, active listening, open body  language, and unconditional positive regard.     Recommendations:  Return to therapy in 2 weeks, engage in self care behaviors, continue to use "I" statements in talks with husband, be honest about what they need to discuss  Plan: Return again in 2 weeks.  Next appointment:  8/5  Diagnosis:  Major depressive disorder, recurrent episode, mild (HCC)  Generalized anxiety disorder  History of attention deficit hyperactivity disorder (ADHD)  Collaboration of Care: Psychiatrist AEB -  psychiatric appointment is upcoming, sent message to doctor re Buspar making her dizzy  Patient/Guardian was advised Release of Information must be obtained prior to any record release in order to collaborate their care with an outside provider. Patient/Guardian was advised if they have not already done so to contact the registration department to sign all necessary forms in order for [place] to release information regarding their care.   Consent: Patient/Guardian gives verbal consent for treatment and assignment of benefits for services provided during this visit. Patient/Guardian expressed understanding and agreed to proceed.   Lynnell Chad, LCSW 08/06/2022

## 2022-08-20 ENCOUNTER — Ambulatory Visit (HOSPITAL_COMMUNITY): Payer: 59 | Admitting: Student

## 2022-08-20 ENCOUNTER — Inpatient Hospital Stay: Payer: 59

## 2022-08-20 ENCOUNTER — Ambulatory Visit (INDEPENDENT_AMBULATORY_CARE_PROVIDER_SITE_OTHER): Payer: Self-pay | Admitting: Clinical

## 2022-08-20 DIAGNOSIS — Z91199 Patient's noncompliance with other medical treatment and regimen due to unspecified reason: Secondary | ICD-10-CM

## 2022-08-20 NOTE — Progress Notes (Signed)
Patient ID: Cheryl Ayala, female   DOB: Nov 13, 1985, 37 y.o.   MRN: [id number]  Therapy Progress Note  Patient had an appointment scheduled with therapist on 08/20/2022  at 4:00pm.  She phoned the front desk just before this appointment to say she has COVID-19.  As per [facility] policy, she will be be charged for this no-show appointment.    Encounter Diagnosis  Name Primary?   No-show for appointment Yes     Ambrose Mantle, LCSW 08/20/2022, 5:22 PM

## 2022-08-22 ENCOUNTER — Other Ambulatory Visit (HOSPITAL_COMMUNITY): Payer: Self-pay

## 2022-08-23 ENCOUNTER — Encounter: Payer: Self-pay | Admitting: Hematology and Oncology

## 2022-08-27 ENCOUNTER — Ambulatory Visit: Payer: 59 | Admitting: Registered"

## 2022-08-31 ENCOUNTER — Encounter: Payer: Self-pay | Admitting: Podiatry

## 2022-08-31 ENCOUNTER — Ambulatory Visit (INDEPENDENT_AMBULATORY_CARE_PROVIDER_SITE_OTHER): Payer: 59 | Admitting: Podiatry

## 2022-08-31 ENCOUNTER — Other Ambulatory Visit (HOSPITAL_COMMUNITY): Payer: Self-pay

## 2022-08-31 ENCOUNTER — Ambulatory Visit (INDEPENDENT_AMBULATORY_CARE_PROVIDER_SITE_OTHER): Payer: 59

## 2022-08-31 ENCOUNTER — Inpatient Hospital Stay: Payer: 59

## 2022-08-31 DIAGNOSIS — M722 Plantar fascial fibromatosis: Secondary | ICD-10-CM

## 2022-08-31 MED ORDER — TRIAMCINOLONE ACETONIDE 10 MG/ML IJ SUSP
10.0000 mg | Freq: Once | INTRAMUSCULAR | Status: AC
Start: 2022-08-31 — End: 2022-08-31
  Administered 2022-08-31: 10 mg via INTRA_ARTICULAR

## 2022-08-31 MED ORDER — DICLOFENAC SODIUM 75 MG PO TBEC
75.0000 mg | DELAYED_RELEASE_TABLET | Freq: Two times a day (BID) | ORAL | 2 refills | Status: DC
  Filled 2022-08-31: qty 50, 25d supply, fill #0

## 2022-08-31 NOTE — Patient Instructions (Signed)

## 2022-09-02 NOTE — Progress Notes (Signed)
Subjective:   Patient ID: Cheryl Ayala, female   DOB: 37 y.o.   MRN: [id number]   HPI Patient states she has had a lot of her left heel and it just started recently.  She had surgery a number years ago which did well   ROS      Objective:  Physical Exam  Neuro neurovascular status intact muscle strength adequate with acute discomfort plantar fascia left at the insertional point tendon calcaneus     Assessment:  Acute plantar fasciitis left fluid buildup around the medial band obesity is complicating factor     Plan:  H&P reviewed sterile prep injected the fascia at insertion 3 mg Kenalog 5 mg Xylocaine advised on shoe gear modifications applied stress elevate the arch and take stress off the arch and heel reappoint 2 weeks  X-rays indicate small spur no indication stress fracture arthritis

## 2022-09-03 ENCOUNTER — Ambulatory Visit (HOSPITAL_COMMUNITY): Payer: 59 | Admitting: Student

## 2022-09-05 ENCOUNTER — Other Ambulatory Visit (HOSPITAL_COMMUNITY): Payer: Self-pay

## 2022-09-10 ENCOUNTER — Ambulatory Visit (HOSPITAL_COMMUNITY): Payer: 59 | Admitting: Student

## 2022-09-12 ENCOUNTER — Encounter: Payer: 59 | Admitting: Registered"

## 2022-09-12 ENCOUNTER — Encounter: Payer: Self-pay | Admitting: Registered"

## 2022-09-12 DIAGNOSIS — E119 Type 2 diabetes mellitus without complications: Secondary | ICD-10-CM | POA: Diagnosis not present

## 2022-09-12 NOTE — Progress Notes (Signed)
Diabetes Self-Management Education  Visit Type: First/Initial  Appt. Start Time: 8:20 Appt. End Time: 9:13  09/12/2022  Ms. Cheryl Ayala, identified by name and date of birth, is a 37 y.o. female with a diagnosis of Diabetes: Type 2.   ASSESSMENT  There were no vitals taken for this visit. There is no height or weight on file to calculate BMI.  States she had gestational diabetes while being pregnant with twin boys who are currently 5 1/38 years old. States she has iron infusions scheduled for 09/2022 for anemia. States she is currently taking 5000 units of Vitamin D Monday - Friday. States she doesn't go outside often due to it being so hot. Reports she has lost about 30 lbs while taking Mounjaro. Reports she has no concerns related to diabetes.  States she doesn't like vegetables; the only vegetables she likes are green beans, corn, and potatoes. States she loves breads and pastas. States she has decreased eating [place] daily to eating it once every 2 weeks.  States Greggory Keen has helped decrease portion sizes.    Diabetes Self-Management Education - 09/12/22 0830       Visit Information   Visit Type First/Initial      Initial Visit   Diabetes Type Type 2    Are you currently following a meal plan? No    Are you taking your medications as prescribed? Yes      Health Coping   How would you rate your overall health? Good      Psychosocial Assessment   Patient Belief/Attitude about Diabetes Motivated to manage diabetes    What is the hardest part about your diabetes right now, causing you the most concern, or is the most worrisome to you about your diabetes?   Other (comment)   no concerns   Self-care barriers None    Other persons present Patient    Patient Concerns Nutrition/Meal planning    Special Needs None    Preferred Learning Style No preference indicated    Learning Readiness Not Ready    How often do you need to have someone help you when you read  instructions, pamphlets, or other written materials from your doctor or pharmacy? 1 - Never    What is the last grade level you completed in school? some college      Complications   Last HgB A1C per patient/outside source 6.2 %    How often do you check your blood sugar? 0 times/day (not testing)    Number of hypoglycemic episodes per month 0    Number of hyperglycemic episodes ( >200mg /dL): Weekly    Can you tell when your blood sugar is high? No    Have you had a dilated eye exam in the past 12 months? No    Have you had a dental exam in the past 12 months? Yes    Are you checking your feet? No      Dietary Intake   Breakfast [place]-ultimate sausage biscuit + hash brown + 1/2 small sweet tea or eggs + bacon + home fries    Lunch 2 slices of pizza + a little sweet tea    Dinner steak + macaroni and cheese + baked potato + Dr. Reino Kent (8 oz)    Beverage(s) sweet tea, water (8 oz), soda (8 oz)      Activity / Exercise   Activity / Exercise Type ADL's    How many days per week do you exercise? 0    How  many minutes per day do you exercise? 0    Total minutes per week of exercise 0      Patient Education   Previous Diabetes Education No    Disease Pathophysiology Definition of diabetes, type 1 and 2, and the diagnosis of diabetes    Healthy Eating Role of diet in the treatment of diabetes and the relationship between the three main macronutrients and blood glucose level;Plate Method    Monitoring Identified appropriate SMBG and/or A1C goals.;Interpreting lab values - A1C, lipid, urine microalbumina.    Acute complications Discussed and identified patients' prevention, symptoms, and treatment of hyperglycemia.;Taught prevention, symptoms, and  treatment of hypoglycemia - the 15 rule.    Chronic complications Applicable immunizations    Diabetes Stress and Support Identified and addressed patients feelings and concerns about diabetes;Worked with patient to identify barriers to care  and solutions;Role of stress on diabetes    Lifestyle and Health Coping Lifestyle issues that need to be addressed for better diabetes care      Individualized Goals (developed by patient)   Nutrition Follow meal plan discussed    Medications take my medication as prescribed    Monitoring  Test my blood glucose as discussed    Problem Solving Eating Pattern    Reducing Risk treat hypoglycemia with 15 grams of carbs if blood glucose less than 70mg /dL    Health Coping Ask for help with psychological, social, or emotional issues      Post-Education Assessment   Patient understands the diabetes disease and treatment process. Demonstrates understanding / competency    Patient understands incorporating nutritional management into lifestyle. Comprehends key points    Patient undertands incorporating physical activity into lifestyle. Needs Instruction    Patient understands using medications safely. Demonstrates understanding / competency    Patient understands monitoring blood glucose, interpreting and using results Comprehends key points    Patient understands prevention, detection, and treatment of acute complications. Demonstrates understanding / competency    Patient understands prevention, detection, and treatment of chronic complications. Needs Instruction    Patient understands how to develop strategies to address psychosocial issues. Demonstrates understanding / competency    Patient understands how to develop strategies to promote health/change behavior. Comprehends key points      Outcomes   Expected Outcomes Demonstrated interest in learning. Expect positive outcomes    Future DMSE 2 months    Program Status Not Completed             Individualized Plan for Diabetes Self-Management Training:   Learning Objective:  Patient will have a greater understanding of diabetes self-management. Patient education plan is to attend individual and/or group sessions per assessed needs and  concerns.   Plan:   Patient Instructions  - Aim to have 1/2 plate of non-starchy vegetables with lunch and dinner.   - Aim to increase water intake by having water with meals; aim for at least 10 oz with each meal.   Expected Outcomes:  Demonstrated interest in learning. Expect positive outcomes  Education material provided: ADA - How to Thrive: A Guide for Your Journey with Diabetes  If problems or questions, patient to contact team via:  Phone and Email  Future DSME appointment: 2 months

## 2022-09-12 NOTE — Patient Instructions (Signed)
-   Aim to have 1/2 plate of non-starchy vegetables with lunch and dinner.   - Aim to increase water intake by having water with meals; aim for at least 10 oz with each meal.

## 2022-09-14 ENCOUNTER — Ambulatory Visit: Payer: 59 | Admitting: Podiatry

## 2022-09-29 ENCOUNTER — Inpatient Hospital Stay: Payer: 59 | Attending: Hematology and Oncology

## 2022-09-29 VITALS — BP 123/79 | HR 77 | Temp 98.2°F | Resp 19

## 2022-09-29 DIAGNOSIS — N920 Excessive and frequent menstruation with regular cycle: Secondary | ICD-10-CM | POA: Insufficient documentation

## 2022-09-29 DIAGNOSIS — L659 Nonscarring hair loss, unspecified: Secondary | ICD-10-CM | POA: Diagnosis not present

## 2022-09-29 DIAGNOSIS — Z818 Family history of other mental and behavioral disorders: Secondary | ICD-10-CM | POA: Insufficient documentation

## 2022-09-29 DIAGNOSIS — Z83438 Family history of other disorder of lipoprotein metabolism and other lipidemia: Secondary | ICD-10-CM | POA: Diagnosis not present

## 2022-09-29 DIAGNOSIS — Z809 Family history of malignant neoplasm, unspecified: Secondary | ICD-10-CM | POA: Diagnosis not present

## 2022-09-29 DIAGNOSIS — Z820 Family history of epilepsy and other diseases of the nervous system: Secondary | ICD-10-CM | POA: Diagnosis not present

## 2022-09-29 DIAGNOSIS — Z8249 Family history of ischemic heart disease and other diseases of the circulatory system: Secondary | ICD-10-CM | POA: Diagnosis not present

## 2022-09-29 DIAGNOSIS — Z79899 Other long term (current) drug therapy: Secondary | ICD-10-CM | POA: Insufficient documentation

## 2022-09-29 DIAGNOSIS — D500 Iron deficiency anemia secondary to blood loss (chronic): Secondary | ICD-10-CM | POA: Insufficient documentation

## 2022-09-29 DIAGNOSIS — Z8349 Family history of other endocrine, nutritional and metabolic diseases: Secondary | ICD-10-CM | POA: Diagnosis not present

## 2022-09-29 MED ORDER — SODIUM CHLORIDE 0.9 % IV SOLN: Freq: Once | INTRAVENOUS | Status: AC

## 2022-09-29 MED ORDER — SODIUM CHLORIDE 0.9 % IV SOLN
400.0000 mg | Freq: Once | INTRAVENOUS | Status: AC
  Administered 2022-09-29: 400 mg via INTRAVENOUS
  Filled 2022-09-29: qty 400

## 2022-09-29 NOTE — Patient Instructions (Signed)
Iron Sucrose Injection What is this medication? IRON SUCROSE (EYE ern SOO krose) treats low levels of iron (iron deficiency anemia) in people with kidney disease. Iron is a mineral that plays an important role in making red blood cells, which carry oxygen from your lungs to the rest of your body. This medicine may be used for other purposes; ask your health care provider or pharmacist if you have questions. COMMON BRAND NAME(S): Venofer What should I tell my care team before I take this medication? They need to know if you have any of these conditions: Anemia not caused by low iron levels Heart disease High levels of iron in the blood Kidney disease Liver disease An unusual or allergic reaction to iron, other medications, foods, dyes, or preservatives Pregnant or trying to get pregnant Breastfeeding How should I use this medication? This medication is for infusion into a vein. It is given in a hospital or clinic setting. Talk to your care team about the use of this medication in children. While this medication may be prescribed for children as young as 2 years for selected conditions, precautions do apply. Overdosage: If you think you have taken too much of this medicine contact a poison control center or emergency room at once. NOTE: This medicine is only for you. Do not share this medicine with others. What if I miss a dose? Keep appointments for follow-up doses. It is important not to miss your dose. Call your care team if you are unable to keep an appointment. What may interact with this medication? Do not take this medication with any of the following: Deferoxamine Dimercaprol Other iron products This medication may also interact with the following: Chloramphenicol Deferasirox This list may not describe all possible interactions. Give your health care provider a list of all the medicines, herbs, non-prescription drugs, or dietary supplements you use. Also tell them if you smoke,  drink alcohol, or use illegal drugs. Some items may interact with your medicine. What should I watch for while using this medication? Visit your care team regularly. Tell your care team if your symptoms do not start to get better or if they get worse. You may need blood work done while you are taking this medication. You may need to follow a special diet. Talk to your care team. Foods that contain iron include: whole grains/cereals, dried fruits, beans, or peas, leafy green vegetables, and organ meats (liver, kidney). What side effects may I notice from receiving this medication? Side effects that you should report to your care team as soon as possible: Allergic reactions--skin rash, itching, hives, swelling of the face, lips, tongue, or throat Low blood pressure--dizziness, feeling faint or lightheaded, blurry vision Shortness of breath Side effects that usually do not require medical attention (report to your care team if they continue or are bothersome): Flushing Headache Joint pain Muscle pain Nausea Pain, redness, or irritation at injection site This list may not describe all possible side effects. Call your doctor for medical advice about side effects. You may report side effects to FDA at 1-800-FDA-1088. Where should I keep my medication? This medication is given in a hospital or clinic. It will not be stored at home. NOTE: This sheet is a summary. It may not cover all possible information. If you have questions about this medicine, talk to your doctor, pharmacist, or health care provider.  2024 Elsevier/Gold Standard (2022-06-08 00:00:00)

## 2022-10-01 ENCOUNTER — Ambulatory Visit: Payer: 59 | Admitting: Podiatry

## 2022-10-03 ENCOUNTER — Telehealth: Payer: 59 | Admitting: Physician Assistant

## 2022-10-03 ENCOUNTER — Other Ambulatory Visit (HOSPITAL_COMMUNITY): Payer: Self-pay

## 2022-10-03 DIAGNOSIS — B3731 Acute candidiasis of vulva and vagina: Secondary | ICD-10-CM

## 2022-10-03 MED ORDER — FLUCONAZOLE 150 MG PO TABS
ORAL_TABLET | ORAL | 0 refills | Status: DC
Start: 2022-10-03 — End: 2022-11-17
  Filled 2022-10-03: qty 2, 3d supply, fill #0

## 2022-10-03 NOTE — Progress Notes (Signed)
I have spent 5 minutes in review of e-visit questionnaire, review and updating patient chart, medical decision making and response to patient.   Mia Milan Cody Jacklynn Dehaas, PA-C    

## 2022-10-03 NOTE — Progress Notes (Signed)

## 2022-10-06 ENCOUNTER — Other Ambulatory Visit: Payer: Self-pay

## 2022-10-06 ENCOUNTER — Inpatient Hospital Stay: Payer: 59

## 2022-10-06 VITALS — BP 143/98 | HR 62 | Temp 97.5°F | Resp 18

## 2022-10-06 DIAGNOSIS — N920 Excessive and frequent menstruation with regular cycle: Secondary | ICD-10-CM | POA: Diagnosis not present

## 2022-10-06 DIAGNOSIS — Z809 Family history of malignant neoplasm, unspecified: Secondary | ICD-10-CM | POA: Diagnosis not present

## 2022-10-06 DIAGNOSIS — D500 Iron deficiency anemia secondary to blood loss (chronic): Secondary | ICD-10-CM | POA: Diagnosis not present

## 2022-10-06 DIAGNOSIS — Z820 Family history of epilepsy and other diseases of the nervous system: Secondary | ICD-10-CM | POA: Diagnosis not present

## 2022-10-06 DIAGNOSIS — Z83438 Family history of other disorder of lipoprotein metabolism and other lipidemia: Secondary | ICD-10-CM | POA: Diagnosis not present

## 2022-10-06 DIAGNOSIS — Z79899 Other long term (current) drug therapy: Secondary | ICD-10-CM | POA: Diagnosis not present

## 2022-10-06 DIAGNOSIS — Z8349 Family history of other endocrine, nutritional and metabolic diseases: Secondary | ICD-10-CM | POA: Diagnosis not present

## 2022-10-06 DIAGNOSIS — Z818 Family history of other mental and behavioral disorders: Secondary | ICD-10-CM | POA: Diagnosis not present

## 2022-10-06 DIAGNOSIS — Z8249 Family history of ischemic heart disease and other diseases of the circulatory system: Secondary | ICD-10-CM | POA: Diagnosis not present

## 2022-10-06 DIAGNOSIS — L659 Nonscarring hair loss, unspecified: Secondary | ICD-10-CM | POA: Diagnosis not present

## 2022-10-06 MED ORDER — SODIUM CHLORIDE 0.9 % IV SOLN
400.0000 mg | Freq: Once | INTRAVENOUS | Status: AC
  Administered 2022-10-06: 400 mg via INTRAVENOUS
  Filled 2022-10-06: qty 400

## 2022-10-06 MED ORDER — SODIUM CHLORIDE 0.9 % IV SOLN: Freq: Once | INTRAVENOUS | Status: AC

## 2022-10-06 NOTE — Patient Instructions (Signed)
Iron Sucrose Injection What is this medication? IRON SUCROSE (EYE ern SOO krose) treats low levels of iron (iron deficiency anemia) in people with kidney disease. Iron is a mineral that plays an important role in making red blood cells, which carry oxygen from your lungs to the rest of your body. This medicine may be used for other purposes; ask your health care provider or pharmacist if you have questions. COMMON BRAND NAME(S): Venofer What should I tell my care team before I take this medication? They need to know if you have any of these conditions: Anemia not caused by low iron levels Heart disease High levels of iron in the blood Kidney disease Liver disease An unusual or allergic reaction to iron, other medications, foods, dyes, or preservatives Pregnant or trying to get pregnant Breastfeeding How should I use this medication? This medication is for infusion into a vein. It is given in a hospital or clinic setting. Talk to your care team about the use of this medication in children. While this medication may be prescribed for children as young as 2 years for selected conditions, precautions do apply. Overdosage: If you think you have taken too much of this medicine contact a poison control center or emergency room at once. NOTE: This medicine is only for you. Do not share this medicine with others. What if I miss a dose? Keep appointments for follow-up doses. It is important not to miss your dose. Call your care team if you are unable to keep an appointment. What may interact with this medication? Do not take this medication with any of the following: Deferoxamine Dimercaprol Other iron products This medication may also interact with the following: Chloramphenicol Deferasirox This list may not describe all possible interactions. Give your health care provider a list of all the medicines, herbs, non-prescription drugs, or dietary supplements you use. Also tell them if you smoke,  drink alcohol, or use illegal drugs. Some items may interact with your medicine. What should I watch for while using this medication? Visit your care team regularly. Tell your care team if your symptoms do not start to get better or if they get worse. You may need blood work done while you are taking this medication. You may need to follow a special diet. Talk to your care team. Foods that contain iron include: whole grains/cereals, dried fruits, beans, or peas, leafy green vegetables, and organ meats (liver, kidney). What side effects may I notice from receiving this medication? Side effects that you should report to your care team as soon as possible: Allergic reactions--skin rash, itching, hives, swelling of the face, lips, tongue, or throat Low blood pressure--dizziness, feeling faint or lightheaded, blurry vision Shortness of breath Side effects that usually do not require medical attention (report to your care team if they continue or are bothersome): Flushing Headache Joint pain Muscle pain Nausea Pain, redness, or irritation at injection site This list may not describe all possible side effects. Call your doctor for medical advice about side effects. You may report side effects to FDA at 1-800-FDA-1088. Where should I keep my medication? This medication is given in a hospital or clinic. It will not be stored at home. NOTE: This sheet is a summary. It may not cover all possible information. If you have questions about this medicine, talk to your doctor, pharmacist, or health care provider.  2024 Elsevier/Gold Standard (2022-06-08 00:00:00)

## 2022-10-06 NOTE — Progress Notes (Signed)
Pt declined 30 min observation post Venofer infusion. Tolerated well. VSS at time of discharge.

## 2022-10-22 ENCOUNTER — Ambulatory Visit (HOSPITAL_COMMUNITY): Payer: 59 | Admitting: Student

## 2022-10-22 ENCOUNTER — Encounter (HOSPITAL_COMMUNITY): Payer: Self-pay

## 2022-10-25 NOTE — Progress Notes (Unsigned)
Date: 11/02/2022 Appointment Start Time: 8am Duration: *** minutes Provider: Helmut Muster, PsyD Type of Session: Initial Appointment for Evaluation  Location of Patient: {gbptloc:23249} Location of Provider: Provider's Home (private office) Type of Contact: Caregility video visit with audio  Session Content:  Prior to proceeding with today's appointment, two pieces of identifying information were obtained from [facility] to verify identity. In addition, Tanna's physical location at the time of this appointment was obtained. In the event of technical difficulties, Maimuna shared a phone number she could be reached at. [place] and this provider participated in today's telepsychological service. Marguriete denied anyone else being present in the room or on the virtual appointment.  The provider's role was explained to [employer]. The provider reviewed and discussed issues of confidentiality, privacy, and limits therein (e.g., reporting obligations). In addition to verbal informed consent, written informed consent for psychological services was obtained from [facility] prior to the initial appointment. Written consent included information concerning the practice, financial arrangements, and confidentiality and patients' rights. Since the clinic is not a 24/7 crisis center, mental health emergency resources were shared, and the provider explained e-mail, voicemail, and/or other messaging systems should be utilized only for non-emergency reasons. This provider also explained that information obtained during appointments will be placed in their electronic medical record in a confidential manner. Jenne verbally acknowledged understanding of the aforementioned and agreed to use mental health emergency resources discussed if needed. Moreover, Amiylah agreed information may be shared with other [facility] or their referring provider(s) as needed for coordination of care. By signing the new patient documents, Banesa  provided written consent for coordination of care. Tanae verbally acknowledged understanding she is ultimately responsible for understanding her insurance benefits as it relates to reimbursement of telepsychological and in-person services. This provider also reviewed confidentiality, as it relates to telepsychological services, as well as the rationale for telepsychological services. This provider further explained that video should not be captured, photos should not be taken, nor should testing stimuli be copied or recorded as it would be a copyright violation. Vicki expressed understanding of the aforementioned, and verbally consented to proceed. Aundrea is aware of the limitations of teleheath visits and verbally consented to proceed.  Savilla completed the psychiatric diagnostic evaluation (history, including past, family, social, and  psychiatric history; behavioral observations; and establishment of a provisional diagnosis). The evaluation was completed in *** minutes. Code 16109 was billed.   Naydelin completed the neurobehavioral examination, which included obtaining a, family, social, and psychiatric history; integration of prior history and other sources of clinical data to assist with clinical decision making; behavioral observations; assessment of thinking, reasoning, and judgment; and establishment of a provisional diagnosis. The evaluation was completed in *** minutes. Codes 60454 and [id number] were billed.   Mental Status Examination:  Appearance: {Apperance:26678} Behavior: {Behavior:26679} Mood: {Mood:26680} Affect: {Affect:26681} Speech: {Speech:26682} Eye Contact: {Eye contact:26683} Psychomotor Activity: {Psychomotor Activity:26684} Thought Process: {Thought Process:26685} Content/Perceptual Disturbances: {Content & Perecptual Disturbances:26686} Orientation: {Orientation:26687} Cognition/Sensorium: {Cognition & Sensorium:26688} Insight: {Insight & Judgment:26689} Judgment:  {Insight & Judgment:26689}  Provisional DSM-5 diagnosis(es):  F89 Unspecified Disorder of Psychological Development   Plan: Testing is expected to answer the question, does the individual meet criteria for ADHD when age, ***other mental health concerns, and cognitive functioning are taken into consideration? Further testing is warranted because a diagnosis cannot be given solely based on current interview data (further data is required). Testing results are expected to answer the remaining diagnostic questions in order to provide an accurate diagnosis and assist in  treatment planning with an expectation of improved clinical outcome. Clarivel is currently scheduled for an appointment on *** at *** via Caregility video visit with audio.                 Margarite Gouge, PsyD

## 2022-10-30 ENCOUNTER — Ambulatory Visit: Payer: 59 | Admitting: Registered"

## 2022-10-30 ENCOUNTER — Other Ambulatory Visit (HOSPITAL_COMMUNITY): Payer: Self-pay

## 2022-11-02 ENCOUNTER — Ambulatory Visit: Payer: 59 | Admitting: Psychology

## 2022-11-02 DIAGNOSIS — F89 Unspecified disorder of psychological development: Secondary | ICD-10-CM

## 2022-11-17 ENCOUNTER — Telehealth: Payer: 59 | Admitting: Family

## 2022-11-17 DIAGNOSIS — B3731 Acute candidiasis of vulva and vagina: Secondary | ICD-10-CM

## 2022-11-17 MED ORDER — FLUCONAZOLE 150 MG PO TABS
150.0000 mg | ORAL_TABLET | ORAL | 0 refills | Status: DC | PRN
Start: 2022-11-17 — End: 2022-12-10

## 2022-11-17 NOTE — Progress Notes (Signed)

## 2022-11-19 ENCOUNTER — Other Ambulatory Visit (HOSPITAL_COMMUNITY): Payer: Self-pay

## 2022-11-26 ENCOUNTER — Encounter: Payer: Self-pay | Admitting: Nurse Practitioner

## 2022-11-26 ENCOUNTER — Other Ambulatory Visit (HOSPITAL_COMMUNITY): Payer: Self-pay

## 2022-11-26 ENCOUNTER — Ambulatory Visit (INDEPENDENT_AMBULATORY_CARE_PROVIDER_SITE_OTHER): Payer: 59 | Admitting: Nurse Practitioner

## 2022-11-26 VITALS — BP 128/89 | HR 77 | Temp 98.0°F | Resp 18 | Ht 67.0 in | Wt 315.8 lb

## 2022-11-26 DIAGNOSIS — E1169 Type 2 diabetes mellitus with other specified complication: Secondary | ICD-10-CM

## 2022-11-26 DIAGNOSIS — E559 Vitamin D deficiency, unspecified: Secondary | ICD-10-CM

## 2022-11-26 DIAGNOSIS — I10 Essential (primary) hypertension: Secondary | ICD-10-CM | POA: Diagnosis not present

## 2022-11-26 DIAGNOSIS — D500 Iron deficiency anemia secondary to blood loss (chronic): Secondary | ICD-10-CM

## 2022-11-26 DIAGNOSIS — Z7985 Long-term (current) use of injectable non-insulin antidiabetic drugs: Secondary | ICD-10-CM

## 2022-11-26 DIAGNOSIS — Z794 Long term (current) use of insulin: Secondary | ICD-10-CM

## 2022-11-26 DIAGNOSIS — E785 Hyperlipidemia, unspecified: Secondary | ICD-10-CM | POA: Diagnosis not present

## 2022-11-26 DIAGNOSIS — E1165 Type 2 diabetes mellitus with hyperglycemia: Secondary | ICD-10-CM | POA: Diagnosis not present

## 2022-11-26 LAB — COMPREHENSIVE METABOLIC PANEL
ALT: 16 U/L (ref 0–35)
AST: 15 U/L (ref 0–37)
Albumin: 4.2 g/dL (ref 3.5–5.2)
Alkaline Phosphatase: 45 U/L (ref 39–117)
BUN: 13 mg/dL (ref 6–23)
CO2: 24 meq/L (ref 19–32)
Calcium: 9.2 mg/dL (ref 8.4–10.5)
Chloride: 104 meq/L (ref 96–112)
Creatinine, Ser: 0.92 mg/dL (ref 0.40–1.20)
GFR: 79.03 mL/min (ref >60.00)
Glucose, Bld: 93 mg/dL (ref 70–99)
Potassium: 3.7 meq/L (ref 3.5–5.1)
Sodium: 141 meq/L (ref 135–145)
Total Bilirubin: 0.4 mg/dL (ref 0.2–1.2)
Total Protein: 7 g/dL (ref 6.0–8.3)

## 2022-11-26 LAB — CBC WITH DIFFERENTIAL/PLATELET
Basophils Absolute: 0.1 10*3/uL (ref 0.0–0.1)
Basophils Relative: 1 % (ref 0.0–3.0)
Eosinophils Absolute: 0.1 10*3/uL (ref 0.0–0.7)
Eosinophils Relative: 1.6 % (ref 0.0–5.0)
HCT: 40.5 % (ref 36.0–46.0)
Hemoglobin: 13.3 g/dL (ref 12.0–15.0)
Lymphocytes Relative: 30.5 % (ref 12.0–46.0)
Lymphs Abs: 2.7 10*3/uL (ref 0.7–4.0)
MCHC: 32.8 g/dL (ref 30.0–36.0)
MCV: 74.3 fL — ABNORMAL LOW (ref 78.0–100.0)
Monocytes Absolute: 0.5 10*3/uL (ref 0.1–1.0)
Monocytes Relative: 5.4 % (ref 3.0–12.0)
Neutro Abs: 5.4 10*3/uL (ref 1.4–7.7)
Neutrophils Relative %: 61.5 % (ref 43.0–77.0)
Platelets: 370 10*3/uL (ref 150.0–400.0)
RBC: 5.45 Mil/uL — ABNORMAL HIGH (ref 3.87–5.11)
RDW: 23.7 % — ABNORMAL HIGH (ref 11.5–15.5)
WBC: 8.8 10*3/uL (ref 4.0–10.5)

## 2022-11-26 LAB — LIPID PANEL
Cholesterol: 177 mg/dL (ref 0–200)
HDL: 35.3 mg/dL — ABNORMAL LOW (ref >39.00)
LDL Cholesterol: 125 mg/dL — ABNORMAL HIGH (ref 0–99)
NonHDL: 141.95
Total CHOL/HDL Ratio: 5
Triglycerides: 86 mg/dL (ref 0.0–149.0)
VLDL: 17.2 mg/dL (ref 0.0–40.0)

## 2022-11-26 LAB — HEMOGLOBIN A1C: Hgb A1c MFr Bld: 5.8 % (ref 4.6–6.5)

## 2022-11-26 LAB — VITAMIN D 25 HYDROXY (VIT D DEFICIENCY, FRACTURES): VITD: 20.44 ng/mL — ABNORMAL LOW (ref 30.00–100.00)

## 2022-11-26 LAB — MICROALBUMIN / CREATININE URINE RATIO
Creatinine,U: 403.4 mg/dL
Microalb Creat Ratio: 1 mg/g (ref 0.0–30.0)
Microalb, Ur: 4 mg/dL — ABNORMAL HIGH (ref 0.0–1.9)

## 2022-11-26 MED ORDER — TIRZEPATIDE 5 MG/0.5ML ~~LOC~~ SOAJ
5.0000 mg | SUBCUTANEOUS | 2 refills | Status: DC
  Filled 2022-11-26: qty 2, 28d supply, fill #0
  Filled 2023-01-14: qty 2, 28d supply, fill #1
  Filled 2023-03-21: qty 2, 28d supply, fill #2

## 2022-11-26 NOTE — Assessment & Plan Note (Signed)
BP at goal with metoprolol and amlodipine BP Readings from Last 3 Encounters:  11/26/22 128/89  10/06/22 (!) 143/98  09/29/22 123/79    Repeat CMP Maintain med doses  Refills sent

## 2022-11-26 NOTE — Assessment & Plan Note (Addendum)
No change in diet, but decreased meal portion with use of mounjaro. Exercise: minimal exercise due to plantar fasciitis Lost 5lbs in last 6months Total weight loss x 8months: 31lbs Wt Readings from Last 3 Encounters:  11/26/22 (!) 315 lb 12.8 oz (143.2 kg)  07/30/22 (!) 322 lb 3.2 oz (146.1 kg)  06/25/22 (!) 320 lb 6.4 oz (145.3 kg)    Encourage to maintain low carb/low sugar/high fiber/high protein diet Encourage in maintain weekly of exercise-use of recumbent bicycle or elliptical or chair exercises Maintain med dose F/up in 64month

## 2022-11-26 NOTE — Assessment & Plan Note (Signed)
Repeat iron panel and cbc: Improved iron panel and CBC. F/up with hematology as recommended.

## 2022-11-26 NOTE — Assessment & Plan Note (Signed)
No adverse effects with mounjaro 5mg  Advised to schedule appointment with ophthalmology  Repeat hgbA1c, UACr, CMP, lipid panel Maintain med dose

## 2022-11-26 NOTE — Assessment & Plan Note (Signed)
Repeat vit D:Persistent low vit D: 50000IU weekly x 12weeks sent, after completion you need to switch to OVER THE COUNTER dose 5000IU daily.

## 2022-11-26 NOTE — Assessment & Plan Note (Signed)
Repeat lipid panel ?

## 2022-11-26 NOTE — Patient Instructions (Addendum)
Go to lab Maintain Heart healthy diet and daily exercise. Maintain current medications. Schedule DM eye exam

## 2022-11-26 NOTE — Progress Notes (Signed)
Established Patient Visit  Patient: Cheryl Ayala   DOB: 10/30/1985   37 y.o. Female  MRN: [id number] Visit Date: 11/27/2022  Subjective:    Chief Complaint  Patient presents with   OFFICE VISIT     6 month follow up. PT is due for eye exam    Diabetes   Hypertension   Hyperlipidemia   Benign essential hypertension BP at goal with metoprolol and amlodipine BP Readings from Last 3 Encounters:  11/26/22 128/89  10/06/22 (!) 143/98  09/29/22 123/79    Repeat CMP Maintain med doses  Refills sent  Hyperlipidemia associated with type 2 diabetes mellitus (HCC) Repeat lipid panel: low HDL and elevated LDL. Need to maintain mediterranean diet and daily exercise  Type 2 diabetes mellitus with hyperglycemia, with long-term current use of insulin (HCC) No adverse effects with mounjaro 5mg  Advised to schedule appointment with ophthalmology  Repeat hgbA1c, UACr, CMP, lipid panel Abnormal lipid panel: low HDL and elevated LDL. Need to maintain mediterranean diet and daily exercise Improved hgbA1c at 5.8% Maintain med dose  Morbid obesity (HCC) No change in diet, but decreased meal portion with use of mounjaro. Exercise: minimal exercise due to plantar fasciitis Lost 5lbs in last 6months Total weight loss x 8months: 31lbs Wt Readings from Last 3 Encounters:  11/26/22 (!) 315 lb 12.8 oz (143.2 kg)  07/30/22 (!) 322 lb 3.2 oz (146.1 kg)  06/25/22 (!) 320 lb 6.4 oz (145.3 kg)    Encourage to maintain low carb/low sugar/high fiber/high protein diet Encourage in maintain weekly of exercise-use of recumbent bicycle or elliptical or chair exercises Maintain med dose F/up in 16month  Vitamin D deficiency Repeat vit D:Persistent low vit D: 50000IU weekly x 12weeks sent, after completion you need to switch to OVER THE COUNTER dose 5000IU daily.   Iron deficiency anemia due to chronic blood loss Repeat iron panel and cbc: Improved iron panel and CBC. F/up with  hematology as recommended.   Reviewed medical, surgical, and social history today  Medications: Outpatient Medications Prior to Visit  Medication Sig   amLODipine (NORVASC) 10 MG tablet Take 1 tablet (10 mg total) by mouth daily.   blood glucose meter kit and supplies Dispense based on patient and insurance preference. Use once a day before breakfast. ICD10:E11.65   Blood Glucose Monitoring Suppl (FREESTYLE LITE) w/Device KIT Use to directed to test blood sugar once a day befor breakfast   diclofenac (VOLTAREN) 75 MG EC tablet Take 1 tablet (75 mg total) by mouth 2 (two) times daily.   fluconazole (DIFLUCAN) 150 MG tablet Take 1 tablet (150 mg total) by mouth every three (3) days as needed.   glucose blood test strip Use as directed to check blood sugar once a day before breakfast   Lancets (FREESTYLE) lancets Use as directed to check blood sugar once a day before breakfast   levonorgestrel (MIRENA) 20 MCG/DAY IUD 1 each by Intrauterine route once.   metoprolol succinate (TOPROL-XL) 100 MG 24 hr tablet Take 1 tablet (100 mg total) by mouth daily. Take with or immediately following a meal.   pantoprazole (PROTONIX) 40 MG tablet Take 1 tablet (40 mg total) by mouth daily.   [DISCONTINUED] tirzepatide [facility]) 5 MG/0.5ML Pen Inject 5 mg into the skin once a week.   atomoxetine (STRATTERA) 40 MG capsule Take 1 capsule (40 mg) by mouth daily. (Patient not taking: Reported on 09/12/2022)   [  DISCONTINUED] hydrocortisone (ANUSOL-HC) 25 MG suppository Insert 1 suppository rectally twice a day for 14 days.   No facility-administered medications prior to visit.   Reviewed past medical and social history.   ROS per HPI above      Objective:  BP 128/89 (BP Location: Left Arm, Patient Position: Sitting, Cuff Size: Large)   Pulse 77   Temp 98 F (36.7 C) (Temporal)   Resp 18   Ht 5\' 7"  (1.702 m)   Wt (!) 315 lb 12.8 oz (143.2 kg)   SpO2 98%   BMI 49.46 kg/m      Physical Exam Vitals and  nursing note reviewed.  Cardiovascular:     Rate and Rhythm: Normal rate and regular rhythm.     Pulses: Normal pulses.     Heart sounds: Normal heart sounds.  Pulmonary:     Effort: Pulmonary effort is normal.     Breath sounds: Normal breath sounds.  Musculoskeletal:     Right lower leg: No edema.     Left lower leg: No edema.  Neurological:     Mental Status: She is alert and oriented to person, place, and time.     Results for orders placed or performed in visit on 11/26/22  Iron, TIBC and Ferritin Panel  Result Value Ref Range   Iron 49 40 - 190 mcg/dL   TIBC 213 086 - 578 mcg/dL (calc)   %SAT 12 (L) 16 - 45 % (calc)   Ferritin 27 16 - 154 ng/mL  CBC with Differential/Platelet  Result Value Ref Range   WBC 8.8 4.0 - 10.5 K/uL   RBC 5.45 (H) 3.87 - 5.11 Mil/uL   Hemoglobin 13.3 12.0 - 15.0 g/dL   HCT 46.9 62.9 - 52.8 %   MCV 74.3 (L) 78.0 - 100.0 fl   MCHC 32.8 30.0 - 36.0 g/dL   RDW 41.3 (H) 24.4 - 01.0 %   Platelets 370.0 150.0 - 400.0 K/uL   Neutrophils Relative % 61.5 43.0 - 77.0 %   Lymphocytes Relative 30.5 12.0 - 46.0 %   Monocytes Relative 5.4 3.0 - 12.0 %   Eosinophils Relative 1.6 0.0 - 5.0 %   Basophils Relative 1.0 0.0 - 3.0 %   Neutro Abs 5.4 1.4 - 7.7 K/uL   Lymphs Abs 2.7 0.7 - 4.0 K/uL   Monocytes Absolute 0.5 0.1 - 1.0 K/uL   Eosinophils Absolute 0.1 0.0 - 0.7 K/uL   Basophils Absolute 0.1 0.0 - 0.1 K/uL  VITAMIN D 25 Hydroxy (Vit-D Deficiency, Fractures)  Result Value Ref Range   VITD 20.44 (L) 30.00 - 100.00 ng/mL  Microalbumin / creatinine urine ratio  Result Value Ref Range   Microalb, Ur 4.0 (H) 0.0 - 1.9 mg/dL   Creatinine,U 272.5 mg/dL   Microalb Creat Ratio 1.0 0.0 - 30.0 mg/g  Lipid panel  Result Value Ref Range   Cholesterol 177 0 - 200 mg/dL   Triglycerides 36.6 0.0 - 149.0 mg/dL   HDL 44.03 (L) >47.42 mg/dL   VLDL 59.5 0.0 - 63.8 mg/dL   LDL Cholesterol 756 (H) 0 - 99 mg/dL   Total CHOL/HDL Ratio 5    NonHDL 141.95    Comprehensive metabolic panel  Result Value Ref Range   Sodium 141 135 - 145 mEq/L   Potassium 3.7 3.5 - 5.1 mEq/L   Chloride 104 96 - 112 mEq/L   CO2 24 19 - 32 mEq/L   Glucose, Bld 93 70 - 99 mg/dL   BUN  13 6 - 23 mg/dL   Creatinine, Ser 7.25 0.40 - 1.20 mg/dL   Total Bilirubin 0.4 0.2 - 1.2 mg/dL   Alkaline Phosphatase 45 39 - 117 U/L   AST 15 0 - 37 U/L   ALT 16 0 - 35 U/L   Total Protein 7.0 6.0 - 8.3 g/dL   Albumin 4.2 3.5 - 5.2 g/dL   GFR 36.64 >40.34 mL/min   Calcium 9.2 8.4 - 10.5 mg/dL  Hemoglobin V4Q  Result Value Ref Range   Hgb A1c MFr Bld 5.8 4.6 - 6.5 %      Assessment & Plan:    Problem List Items Addressed This Visit     Benign essential hypertension - Primary    BP at goal with metoprolol and amlodipine BP Readings from Last 3 Encounters:  11/26/22 128/89  10/06/22 (!) 143/98  09/29/22 123/79    Repeat CMP Maintain med doses  Refills sent      Relevant Orders   Comprehensive metabolic panel (Completed)   Hyperlipidemia associated with type 2 diabetes mellitus (HCC)    Repeat lipid panel: low HDL and elevated LDL. Need to maintain mediterranean diet and daily exercise      Relevant Medications   tirzepatide (MOUNJARO) 5 MG/0.5ML Pen   Other Relevant Orders   Lipid panel (Completed)   Iron deficiency anemia due to chronic blood loss    Repeat iron panel and cbc: Improved iron panel and CBC. F/up with hematology as recommended.      Relevant Orders   Iron, TIBC and Ferritin Panel (Completed)   CBC with Differential/Platelet (Completed)   Long-term (current) use of injectable non-insulin antidiabetic drugs   Relevant Medications   tirzepatide (MOUNJARO) 5 MG/0.5ML Pen   Morbid obesity (HCC)    No change in diet, but decreased meal portion with use of mounjaro. Exercise: minimal exercise due to plantar fasciitis Lost 5lbs in last 6months Total weight loss x 8months: 31lbs Wt Readings from Last 3 Encounters:  11/26/22 (!) 315 lb 12.8 oz  (143.2 kg)  07/30/22 (!) 322 lb 3.2 oz (146.1 kg)  06/25/22 (!) 320 lb 6.4 oz (145.3 kg)    Encourage to maintain low carb/low sugar/high fiber/high protein diet Encourage in maintain weekly of exercise-use of recumbent bicycle or elliptical or chair exercises Maintain med dose F/up in 69month      Relevant Medications   tirzepatide [facility]) 5 MG/0.5ML Pen   Type 2 diabetes mellitus with hyperglycemia, with long-term current use of insulin (HCC)    No adverse effects with mounjaro 5mg  Advised to schedule appointment with ophthalmology  Repeat hgbA1c, UACr, CMP, lipid panel Abnormal lipid panel: low HDL and elevated LDL. Need to maintain mediterranean diet and daily exercise Improved hgbA1c at 5.8% Maintain med dose      Relevant Medications   tirzepatide (MOUNJARO) 5 MG/0.5ML Pen   Other Relevant Orders   Microalbumin / creatinine urine ratio (Completed)   Comprehensive metabolic panel (Completed)   Hemoglobin A1c (Completed)   Vitamin D deficiency    Repeat vit D:Persistent low vit D: 50000IU weekly x 12weeks sent, after completion you need to switch to OVER THE COUNTER dose 5000IU daily.       Relevant Medications   Vitamin D, Ergocalciferol, (DRISDOL) 1.25 MG (50000 UNIT) CAPS capsule   Other Relevant Orders   VITAMIN D 25 Hydroxy (Vit-D Deficiency, Fractures) (Completed)   Return in about 3 months (around 02/26/2023) for CPE (fasting).     Alysia Penna, NP

## 2022-11-27 ENCOUNTER — Other Ambulatory Visit (HOSPITAL_COMMUNITY): Payer: Self-pay

## 2022-11-27 DIAGNOSIS — Z7985 Long-term (current) use of injectable non-insulin antidiabetic drugs: Secondary | ICD-10-CM | POA: Insufficient documentation

## 2022-11-27 LAB — IRON,TIBC AND FERRITIN PANEL
%SAT: 12 % — ABNORMAL LOW (ref 16–45)
Ferritin: 27 ng/mL (ref 16–154)
Iron: 49 ug/dL (ref 40–190)
TIBC: 403 ug/dL (ref 250–450)

## 2022-11-27 MED ORDER — VITAMIN D (ERGOCALCIFEROL) 1.25 MG (50000 UNIT) PO CAPS
50000.0000 [IU] | ORAL_CAPSULE | ORAL | 0 refills | Status: DC
  Filled 2022-11-27: qty 12, 84d supply, fill #0

## 2022-11-27 NOTE — Addendum Note (Signed)
Addended by: Alysia Penna L on: 11/27/2022 10:52 AM   Modules accepted: Orders

## 2022-12-03 ENCOUNTER — Other Ambulatory Visit (HOSPITAL_COMMUNITY): Payer: Self-pay

## 2022-12-10 ENCOUNTER — Other Ambulatory Visit (HOSPITAL_COMMUNITY): Payer: Self-pay

## 2022-12-10 ENCOUNTER — Ambulatory Visit (INDEPENDENT_AMBULATORY_CARE_PROVIDER_SITE_OTHER): Payer: 59 | Admitting: Urology

## 2022-12-10 ENCOUNTER — Encounter: Payer: Self-pay | Admitting: Urology

## 2022-12-10 VITALS — BP 151/102 | HR 86 | Ht 67.0 in | Wt 318.0 lb

## 2022-12-10 DIAGNOSIS — N3946 Mixed incontinence: Secondary | ICD-10-CM | POA: Diagnosis not present

## 2022-12-10 LAB — URINALYSIS, ROUTINE W REFLEX MICROSCOPIC
Bilirubin, UA: NEGATIVE
Glucose, UA: NEGATIVE
Ketones, UA: NEGATIVE
Leukocytes,UA: NEGATIVE
Nitrite, UA: NEGATIVE
Protein,UA: NEGATIVE
Specific Gravity, UA: >1.03 — ABNORMAL HIGH (ref 1.005–1.030)
Urobilinogen, Ur: 0.2 mg/dL (ref 0.2–1.0)
pH, UA: 6 (ref 5.0–7.5)

## 2022-12-10 LAB — MICROSCOPIC EXAMINATION: Epithelial Cells (non renal): >10 /[HPF] — AB (ref 0–10)

## 2022-12-10 LAB — BLADDER SCAN AMB NON-IMAGING

## 2022-12-10 MED ORDER — SOLIFENACIN SUCCINATE 10 MG PO TABS
10.0000 mg | ORAL_TABLET | Freq: Every day | ORAL | 11 refills | Status: DC
  Filled 2022-12-10 – 2023-01-14 (×2): qty 30, 30d supply, fill #0
  Filled 2023-03-21: qty 30, 30d supply, fill #1
  Filled 2023-04-15: qty 30, 30d supply, fill #2
  Filled 2023-05-16: qty 30, 30d supply, fill #3

## 2022-12-10 NOTE — Progress Notes (Signed)
Assessment: 1. Mixed incontinence     Plan: I personally reviewed the patient's chart including provider notes and lab results. Diagnosis and management of stress and urge incontinence discussed with the patient.  Options for management of SUI including pelvic floor exercises, biofeedback, urethral bulking, and urethral sling discussed. Options for management of UI discussed including behavioral modification, avoidance of dietary irritants, medical therapy, neuromodulation, and chemo-denervation discussed.   Her primary symptoms seem to be UI/unaware incontinence.   Instructions on pelvic floor exercised provided. Trial of solifenacin 10 mg daily.  Rx sent. Return to office in 1 month  Chief Complaint:  Chief Complaint  Patient presents with   Urinary Incontinence    History of Present Illness:  Cheryl Ayala is a 37 y.o. female who is seen for evaluation of urinary incontinence. She has had symptoms for several years with some recent worsening within the past 2 years following her last pregnancy in 2022.  She reports incontinence with coughing and sneezing as well as incontinence without any activity or awareness.  She notes increased symptoms at night while sleeping.  She is using at least 1 panty liner per day.  She does not have frequency or urgency.  No dysuria or gross hematuria.  No recent UTIs.  No problems with fecal incontinence. She tried pelvic floor physical therapy for several sessions without improvement in her symptoms.   Past Medical History:  Past Medical History:  Diagnosis Date   Anovulation 05/19/2015   Anxiety    Diabetes mellitus without complication (HCC)    Esophageal dysphagia    GERD (gastroesophageal reflux disease)    Hypersomnia 12/23/2015   Hypertension    Plantar fasciitis    Preterm premature rupture of membranes 01/05/2020   Venous insufficiency     Past Surgical History:  Past Surgical History:  Procedure Laterality Date   CESAREAN  SECTION MULTI-GESTATIONAL N/A 01/25/2020   Procedure: CESAREAN SECTION MULTI-GESTATIONAL;  Surgeon: Mitchel Honour, DO;  Location: [facility];  Service: Obstetrics;  Laterality: N/A;   CHOLECYSTECTOMY, LAPAROSCOPIC  04/09/2019   ESOPHAGEAL MANOMETRY N/A 09/25/2017   Procedure: ESOPHAGEAL MANOMETRY (EM);  Surgeon: Napoleon Form, MD;  Location: [facility];  Service: Endoscopy;  Laterality: N/A;   IVF     PLANTAR FASCIA RELEASE Bilateral    2019 and 2020   WISDOM TOOTH EXTRACTION  2013    Allergies:  Allergies  Allergen Reactions   Penicillins Rash    Has patient had a PCN reaction causing immediate rash, facial/tongue/throat swelling, SOB or lightheadedness with hypotension: Yes Has patient had a PCN reaction causing severe rash involving mucus membranes or skin necrosis: No Has patient had a PCN reaction that required hospitalization: No Has patient had a PCN reaction occurring within the last 10 years: No If all of the above answers are "NO", then may proceed with Cephalosporin use.    Sulfa Antibiotics Rash    Family History:  Family History  Problem Relation Age of Onset   Cancer Mother    Hypertension Father    Hyperlipidemia Father    Anxiety disorder Father    Depression Father    Heart disease Father    AAA (abdominal aortic aneurysm) Father    Depression Sister    Anxiety disorder Sister    Heart disease Maternal Grandmother    Alzheimer's disease Maternal Grandmother    Stroke Maternal Grandmother    Gout Maternal Grandfather    Heart disease Paternal Grandfather     Social  History:  Social History   Tobacco Use   Smoking status: Never   Smokeless tobacco: Never  Vaping Use   Vaping status: Never Used  Substance Use Topics   Alcohol use: Yes    Comment: once every 6 months    Drug use: Never    Review of symptoms:  Constitutional:  Negative for unexplained weight loss, night sweats, fever, chills ENT:  Negative for nose bleeds, sinus pain,  painful swallowing CV:  Negative for chest pain, shortness of breath, exercise intolerance, palpitations, loss of consciousness Resp:  Negative for cough, wheezing, shortness of breath GI:  Negative for nausea, vomiting, diarrhea, bloody stools GU:  Positives noted in HPI; otherwise negative for gross hematuria, dysuria Neuro:  Negative for seizures, poor balance, limb weakness, slurred speech Psych:  Negative for lack of energy, depression, anxiety Endocrine:  Negative for polydipsia, polyuria, symptoms of hypoglycemia (dizziness, hunger, sweating) Hematologic:  Negative for anemia, purpura, petechia, prolonged or excessive bleeding, use of anticoagulants  Allergic:  Negative for difficulty breathing or choking as a result of exposure to anything; no shellfish allergy; no allergic response (rash/itch) to materials, foods  Physical exam: BP (!) 151/102   Pulse 86   Ht 5\' 7"  (1.702 m)   Wt (!) 318 lb (144.2 kg)   BMI 49.81 kg/m  GENERAL APPEARANCE:  Well appearing, well developed, well nourished, NAD HEENT: Atraumatic, Normocephalic, oropharynx clear. NECK: Supple without lymphadenopathy or thyromegaly. LUNGS: Clear to auscultation bilaterally. HEART: Regular Rate and Rhythm without murmurs, gallops, or rubs. ABDOMEN: Soft, non-tender, No Masses. EXTREMITIES: Moves all extremities well.  Without clubbing, cyanosis, or edema. NEUROLOGIC:  Alert and oriented x 3, normal gait, CN II-XII grossly intact.  MENTAL STATUS:  Appropriate. BACK:  Non-tender to palpation.  No CVAT SKIN:  Warm, dry and intact.  GU: Urethra: no leakage with cough or valsalva Vagina: no prolapse; weak Kegel  A chaperone was present during the examination.    Results: U/A:  0-5 WBC, 0-2 RBC, >10 epithelial cells, mod bacteria  PVR = 2 ml

## 2022-12-20 ENCOUNTER — Other Ambulatory Visit (HOSPITAL_COMMUNITY): Payer: Self-pay

## 2023-01-14 ENCOUNTER — Other Ambulatory Visit (HOSPITAL_COMMUNITY): Payer: Self-pay

## 2023-01-14 ENCOUNTER — Other Ambulatory Visit: Payer: Self-pay

## 2023-01-15 ENCOUNTER — Other Ambulatory Visit (HOSPITAL_COMMUNITY): Payer: Self-pay

## 2023-01-21 ENCOUNTER — Ambulatory Visit: Payer: 59 | Admitting: Urology

## 2023-01-21 NOTE — Progress Notes (Deleted)
 Assessment: 1. Mixed incontinence     Plan: I personally reviewed the patient's chart including provider notes and lab results. Diagnosis and management of stress and urge incontinence discussed with the patient.  Options for management of SUI including pelvic floor exercises, biofeedback, urethral bulking, and urethral sling discussed. Options for management of UI discussed including behavioral modification, avoidance of dietary irritants, medical therapy, neuromodulation, and chemo-denervation discussed.   Her primary symptoms seem to be UI/unaware incontinence.   Instructions on pelvic floor exercised provided. Trial of solifenacin  10 mg daily.  Rx sent. Return to office in 1 month  Chief Complaint:  No chief complaint on file.   History of Present Illness:  Cheryl Ayala is a 38 y.o. female who is seen for continued evaluation of mixed urinary incontinence. At her initial visit in November 2024, she reported incontinence symptoms for several years with worsening within the past 2 years following her last pregnancy in 2022.  She reported incontinence with coughing and sneezing as well as incontinence without any activity or awareness.  She noted increased symptoms at night while sleeping.  She was using at least 1 panty liner per day.  She denied frequency or urgency.  No dysuria or gross hematuria.  No recent UTIs.  No problems with fecal incontinence. She tried pelvic floor physical therapy for several sessions without improvement in her symptoms. Examination demonstrated no evidence of prolapse or incontinence and weak pelvic floor muscles. She was instructed on pelvic floor exercises.  She was also given a trial of Solifenacin  10 mg daily.  Portions of the above documentation were copied from a prior visit for review purposes only.  Past Medical History:  Past Medical History:  Diagnosis Date   Anovulation 05/19/2015   Anxiety    Diabetes mellitus without complication  (HCC)    Esophageal dysphagia    GERD (gastroesophageal reflux disease)    Hypersomnia 12/23/2015   Hypertension    Plantar fasciitis    Preterm premature rupture of membranes 01/05/2020   Venous insufficiency     Past Surgical History:  Past Surgical History:  Procedure Laterality Date   CESAREAN SECTION MULTI-GESTATIONAL N/A 01/25/2020   Procedure: CESAREAN SECTION MULTI-GESTATIONAL;  Surgeon: Dannielle Bouchard, DO;  Location: [facility];  Service: Obstetrics;  Laterality: N/A;   CHOLECYSTECTOMY, LAPAROSCOPIC  04/09/2019   ESOPHAGEAL MANOMETRY N/A 09/25/2017   Procedure: ESOPHAGEAL MANOMETRY (EM);  Surgeon: Shila Gustav GAILS, MD;  Location: [facility];  Service: Endoscopy;  Laterality: N/A;   IVF     PLANTAR FASCIA RELEASE Bilateral    2019 and 2020   WISDOM TOOTH EXTRACTION  2013    Allergies:  Allergies  Allergen Reactions   Penicillins Rash    Has patient had a PCN reaction causing immediate rash, facial/tongue/throat swelling, SOB or lightheadedness with hypotension: Yes Has patient had a PCN reaction causing severe rash involving mucus membranes or skin necrosis: No Has patient had a PCN reaction that required hospitalization: No Has patient had a PCN reaction occurring within the last 10 years: No If all of the above answers are NO, then may proceed with Cephalosporin use.    Sulfa Antibiotics Rash    Family History:  Family History  Problem Relation Age of Onset   Cancer Mother    Hypertension Father    Hyperlipidemia Father    Anxiety disorder Father    Depression Father    Heart disease Father    AAA (abdominal aortic aneurysm) Father  Depression Sister    Anxiety disorder Sister    Heart disease Maternal Grandmother    Alzheimer's disease Maternal Grandmother    Stroke Maternal Grandmother    Gout Maternal Grandfather    Heart disease Paternal Grandfather     Social History:  Social History   Tobacco Use   Smoking status: Never   Smokeless  tobacco: Never  Vaping Use   Vaping status: Never Used  Substance Use Topics   Alcohol use: Yes    Comment: once every 6 months    Drug use: Never    ROS: Constitutional:  Negative for fever, chills, weight loss CV: Negative for chest pain, previous MI, hypertension Respiratory:  Negative for shortness of breath, wheezing, sleep apnea, frequent cough GI:  Negative for nausea, vomiting, bloody stool, GERD  Physical exam: There were no vitals taken for this visit. GENERAL APPEARANCE:  Well appearing, well developed, well nourished, NAD HEENT:  Atraumatic, normocephalic, oropharynx clear NECK:  Supple without lymphadenopathy or thyromegaly ABDOMEN:  Soft, non-tender, no masses EXTREMITIES:  Moves all extremities well, without clubbing, cyanosis, or edema NEUROLOGIC:  Alert and oriented x 3, normal gait, CN II-XII grossly intact MENTAL STATUS:  appropriate BACK:  Non-tender to palpation, No CVAT SKIN:  Warm, dry, and intact  Results: U/A:

## 2023-03-08 ENCOUNTER — Encounter: Payer: Self-pay | Admitting: Hematology and Oncology

## 2023-03-11 ENCOUNTER — Encounter: Payer: BC Managed Care – PPO | Admitting: Nurse Practitioner

## 2023-03-11 ENCOUNTER — Encounter: Payer: Self-pay | Admitting: Nurse Practitioner

## 2023-03-15 ENCOUNTER — Encounter: Payer: Self-pay | Admitting: Hematology and Oncology

## 2023-03-15 ENCOUNTER — Other Ambulatory Visit: Payer: Self-pay

## 2023-03-21 ENCOUNTER — Other Ambulatory Visit: Payer: Self-pay | Admitting: Nurse Practitioner

## 2023-03-21 ENCOUNTER — Other Ambulatory Visit: Payer: Self-pay

## 2023-03-21 DIAGNOSIS — I10 Essential (primary) hypertension: Secondary | ICD-10-CM

## 2023-03-22 ENCOUNTER — Other Ambulatory Visit (HOSPITAL_COMMUNITY): Payer: Self-pay

## 2023-03-22 MED ORDER — METOPROLOL SUCCINATE ER 100 MG PO TB24
100.0000 mg | ORAL_TABLET | Freq: Every day | ORAL | 3 refills | Status: AC
  Filled 2023-03-22: qty 30, 30d supply, fill #0
  Filled 2023-04-15: qty 30, 30d supply, fill #1
  Filled 2023-05-16: qty 30, 30d supply, fill #2
  Filled 2023-06-28: qty 30, 30d supply, fill #3
  Filled 2023-07-31: qty 30, 30d supply, fill #4
  Filled 2023-09-23: qty 30, 30d supply, fill #5
  Filled 2023-10-29: qty 30, 30d supply, fill #6
  Filled 2023-12-19: qty 30, 30d supply, fill #7

## 2023-03-22 MED ORDER — AMLODIPINE BESYLATE 10 MG PO TABS
10.0000 mg | ORAL_TABLET | Freq: Every day | ORAL | 3 refills | Status: DC
Start: 2023-03-22 — End: 2023-10-23
  Filled 2023-03-22: qty 30, 30d supply, fill #0
  Filled 2023-04-15: qty 30, 30d supply, fill #1
  Filled 2023-05-16: qty 30, 30d supply, fill #2

## 2023-04-08 ENCOUNTER — Encounter: Payer: Self-pay | Admitting: Nurse Practitioner

## 2023-04-08 ENCOUNTER — Other Ambulatory Visit (HOSPITAL_COMMUNITY): Payer: Self-pay

## 2023-04-08 ENCOUNTER — Ambulatory Visit: Payer: BC Managed Care – PPO | Admitting: Nurse Practitioner

## 2023-04-08 VITALS — BP 120/80 | HR 72 | Temp 98.0°F | Ht 67.0 in | Wt 317.6 lb

## 2023-04-08 DIAGNOSIS — Z0001 Encounter for general adult medical examination with abnormal findings: Secondary | ICD-10-CM | POA: Diagnosis not present

## 2023-04-08 DIAGNOSIS — I10 Essential (primary) hypertension: Secondary | ICD-10-CM

## 2023-04-08 DIAGNOSIS — Z794 Long term (current) use of insulin: Secondary | ICD-10-CM

## 2023-04-08 DIAGNOSIS — E785 Hyperlipidemia, unspecified: Secondary | ICD-10-CM

## 2023-04-08 DIAGNOSIS — Z7985 Long-term (current) use of injectable non-insulin antidiabetic drugs: Secondary | ICD-10-CM | POA: Diagnosis not present

## 2023-04-08 DIAGNOSIS — E1165 Type 2 diabetes mellitus with hyperglycemia: Secondary | ICD-10-CM | POA: Diagnosis not present

## 2023-04-08 DIAGNOSIS — E1169 Type 2 diabetes mellitus with other specified complication: Secondary | ICD-10-CM

## 2023-04-08 MED ORDER — TIRZEPATIDE 5 MG/0.5ML ~~LOC~~ SOAJ
5.0000 mg | SUBCUTANEOUS | 1 refills | Status: AC
  Filled 2023-04-08: qty 6, 84d supply, fill #0
  Filled 2023-05-16: qty 2, 28d supply, fill #0
  Filled 2023-06-28: qty 2, 28d supply, fill #1
  Filled 2023-07-31: qty 2, 28d supply, fill #2
  Filled 2023-09-13: qty 2, 28d supply, fill #3
  Filled 2023-10-29: qty 2, 28d supply, fill #4
  Filled 2023-11-23: qty 2, 28d supply, fill #5

## 2023-04-08 NOTE — Assessment & Plan Note (Addendum)
 Calculated UACr: 9.98mg /g Advised to schedule annual DIABETES eye exam Normal foot exam today BP at goal Constipation with mounjaro, relief with use of miralax. Reviewed labs completed by [facility] 03/20/2023: hgbA1c at 6%, LDL at 123, normal renal function.  Advised about her risk for CAD and need to improve LDL. Advised about importance of mediterranean diet and need to increase daily exercise. F/up in 3months

## 2023-04-08 NOTE — Assessment & Plan Note (Signed)
 BP at goal with metoprolol and amlodipine LE edema improved with DASH diet and use of compression stockings BP Readings from Last 3 Encounters:  04/08/23 120/80  12/10/22 (!) 151/102  11/26/22 128/89    Maintain med doses F/up in 6months

## 2023-04-08 NOTE — Assessment & Plan Note (Signed)
 She has established care with [facility] bariatric clinic for surgery. She has consultation with nutritionist. She has eliminated fast food in last 3weeks. She has been able to maintain 48oz of water daily, goal is 64oz. She continues to struggle with high fiber and high protein diet. Exercise: walking 2x/wee-77mins at a time. Reporst constipation with mounjaro, but improves with use of miralax. No weight loss noted in last 4months 2lbs weight gain in last 4months. Wt Readings from Last 3 Encounters:  04/08/23 (!) 317 lb 9.6 oz (144.1 kg)  12/10/22 (!) 318 lb (144.2 kg)  11/26/22 (!) 315 lb 12.8 oz (143.2 kg)    Maintain mounjaro dose Signed form for bariatric surgery

## 2023-04-08 NOTE — Progress Notes (Signed)
 Complete physical exam  Patient: Cheryl Ayala   DOB: 01/31/1985   38 y.o. Female  MRN: [id number] Visit Date: 04/08/2023  Subjective:    Chief Complaint  Patient presents with   Surgical Clearance    Cheryl Ayala is a 38 y.o. female who presents today for a complete physical exam. She reports consuming a general diet.  Walking 2x/week , at a time  She generally feels well. She reports sleeping well. She does have additional problems to discuss today.  Vision:No Dental:No STD Screen:No  BP Readings from Last 3 Encounters:  04/08/23 120/80  12/10/22 (!) 151/102  11/26/22 128/89   Wt Readings from Last 3 Encounters:  04/08/23 (!) 317 lb 9.6 oz (144.1 kg)  12/10/22 (!) 318 lb (144.2 kg)  11/26/22 (!) 315 lb 12.8 oz (143.2 kg)   Most recent fall risk assessment:    04/08/2023    9:04 AM  Fall Risk   Falls in the past year? 0  Number falls in past yr: 0  Injury with Fall? 0  Risk for fall due to : No Fall Risks  Follow up Falls evaluation completed   Depression screen:Yes - No Depression  Most recent depression screenings:    04/08/2023    9:04 AM 11/26/2022    8:50 AM  PHQ 2/9 Scores  PHQ - 2 Score 2 2  PHQ- 9 Score 5 4    HPI  Type 2 diabetes mellitus with hyperglycemia, with long-term current use of insulin (HCC) Calculated UACr: 9.98mg /g Advised to schedule annual DIABETES eye exam Normal foot exam today BP at goal Constipation with mounjaro, relief with use of miralax. Reviewed labs completed by [facility] 03/20/2023: hgbA1c at 6%, LDL at 123, normal renal function.  Advised about her risk for CAD and need to improve LDL. Advised about importance of mediterranean diet and need to increase daily exercise. F/up in 3months   Benign essential hypertension BP at goal with metoprolol and amlodipine LE edema improved with DASH diet and use of compression stockings BP Readings from Last 3 Encounters:  04/08/23 120/80  12/10/22 (!) 151/102   11/26/22 128/89    Maintain med doses F/up in 6months  Morbid obesity (HCC) She has established care with [facility] bariatric clinic for surgery. She has consultation with nutritionist. She has eliminated fast food in last 3weeks. She has been able to maintain 48oz of water daily, goal is 64oz. She continues to struggle with high fiber and high protein diet. Exercise: walking 2x/wee-41mins at a time. Reporst constipation with mounjaro, but improves with use of miralax. No weight loss noted in last 4months 2lbs weight gain in last 4months. Wt Readings from Last 3 Encounters:  04/08/23 (!) 317 lb 9.6 oz (144.1 kg)  12/10/22 (!) 318 lb (144.2 kg)  11/26/22 (!) 315 lb 12.8 oz (143.2 kg)    Maintain mounjaro dose Signed form for bariatric surgery  Hyperlipidemia associated with type 2 diabetes mellitus (HCC) LdL at 123 Advised to maintain mediterranean diet   Past Medical History:  Diagnosis Date   Anovulation 05/19/2015   Anxiety    Diabetes mellitus without complication (HCC)    Esophageal dysphagia    GERD (gastroesophageal reflux disease)    Hypersomnia 12/23/2015   Hypertension    Plantar fasciitis    Preterm premature rupture of membranes 01/05/2020   Venous insufficiency    Past Surgical History:  Procedure Laterality Date   CESAREAN SECTION MULTI-GESTATIONAL N/A 01/25/2020   Procedure: CESAREAN  SECTION MULTI-GESTATIONAL;  Surgeon: Mitchel Honour, DO;  Location: [facility] LD ORS;  Service: Obstetrics;  Laterality: N/A;   CHOLECYSTECTOMY, LAPAROSCOPIC  04/09/2019   ESOPHAGEAL MANOMETRY N/A 09/25/2017   Procedure: ESOPHAGEAL MANOMETRY (EM);  Surgeon: Napoleon Form, MD;  Location: [facility] ENDOSCOPY;  Service: Endoscopy;  Laterality: N/A;   IVF     PLANTAR FASCIA RELEASE Bilateral    2019 and 2020   WISDOM TOOTH EXTRACTION  2013   Social History   Socioeconomic History   Marital status: Married    Spouse name: Not on file   Number of children: Not on file   Years of  education: Not on file   Highest education level: Not on file  Occupational History   Not on file  Tobacco Use   Smoking status: Never   Smokeless tobacco: Never  Vaping Use   Vaping status: Never Used  Substance and Sexual Activity   Alcohol use: Yes    Comment: once every 6 months    Drug use: Never   Sexual activity: Yes  Other Topics Concern   Not on file  Social History Narrative   Not on file   Social Drivers of Health   Financial Resource Strain: Low Risk  (03/07/2023)   [facility]   Overall Financial Resource Strain (CARDIA)    Difficulty of Paying Living Expenses: Not hard at all  Food Insecurity: No Food Insecurity (03/07/2023)   Received from [facility]   Hunger Vital Sign    Worried About Running Out of Food in the Last Year: Never true    Ran Out of Food in the Last Year: Never true  Transportation Needs: No Transportation Needs (03/07/2023)   Received from [facility] - Transportation    Lack of Transportation (Medical): No    Lack of Transportation (Non-Medical): No  Physical Activity: Insufficiently Active (03/07/2023)   Received from [facility]   Exercise Vital Sign    Days of Exercise per Week: 1 day    Minutes of Exercise per Session: 10 min  Stress: Stress Concern Present (03/07/2023)   Received from [facility] - Occupational Stress Questionnaire    Feeling of Stress : To some extent  Social Connections: Moderately Integrated (03/07/2023)   Received from [facility]   Social Network    How would you rate your social network (family, work, friends)?: Adequate participation with social networks  Intimate Partner Violence: Not At Risk (03/07/2023)   Received from [facility]   HITS    Over the last 12 months how often did your partner physically hurt you?: Never    Over the last 12 months how often did your partner insult you or talk down to you?: Never    Over the last  12 months how often did your partner threaten you with physical harm?: Never    Over the last 12 months how often did your partner scream or curse at you?: Never   Family Status  Relation Name Status   Mother  (Not Specified)   Father  (Not Specified)   Sister  (Not Specified)   MGM  (Not Specified)   MGF  (Not Specified)   PGF  (Not Specified)  No partnership data on file   Family History  Problem Relation Age of Onset   Cancer Mother    Hypertension Father    Hyperlipidemia Father    Anxiety disorder Father  Depression Father    Heart disease Father    AAA (abdominal aortic aneurysm) Father    Depression Sister    Anxiety disorder Sister    Heart disease Maternal Grandmother    Alzheimer's disease Maternal Grandmother    Stroke Maternal Grandmother    Gout Maternal Grandfather    Heart disease Paternal Grandfather    Allergies  Allergen Reactions   Penicillins Rash    Has patient had a PCN reaction causing immediate rash, facial/tongue/throat swelling, SOB or lightheadedness with hypotension: Yes Has patient had a PCN reaction causing severe rash involving mucus membranes or skin necrosis: No Has patient had a PCN reaction that required hospitalization: No Has patient had a PCN reaction occurring within the last 10 years: No If all of the above answers are "NO", then may proceed with Cephalosporin use.    Sulfa Antibiotics Rash    Patient Care Team: Tajai Ihde, Bonna Gains, NP as PCP - General (Internal Medicine)   Medications: Outpatient Medications Prior to Visit  Medication Sig   amLODipine (NORVASC) 10 MG tablet Take 1 tablet (10 mg total) by mouth daily.   blood glucose meter kit and supplies Dispense based on patient and insurance preference. Use once a day before breakfast. ICD10:E11.65   Blood Glucose Monitoring Suppl (FREESTYLE LITE) w/Device KIT Use to directed to test blood sugar once a day befor breakfast   diclofenac (VOLTAREN) 75 MG EC tablet Take 1  tablet (75 mg total) by mouth 2 (two) times daily. (Patient taking differently: Take 75 mg by mouth 2 (two) times daily as needed for mild pain (pain score 1-3) or moderate pain (pain score 4-6).)   Lancets (FREESTYLE) lancets Use as directed to check blood sugar once a day before breakfast   levonorgestrel (MIRENA) 20 MCG/DAY IUD 1 each by Intrauterine route once.   metoprolol succinate (TOPROL-XL) 100 MG 24 hr tablet Take 1 tablet (100 mg total) by mouth daily. Take with or immediately following a meal.   pantoprazole (PROTONIX) 40 MG tablet Take 1 tablet (40 mg total) by mouth daily.   solifenacin (VESICARE) 10 MG tablet Take 1 tablet (10 mg total) by mouth daily.   [DISCONTINUED] tirzepatide [facility]) 5 MG/0.5ML Pen Inject 5 mg into the skin once a week.   Cholecalciferol 50 MCG (2000 UT) TABS Take 1 tablet by mouth daily at 6 (six) AM.   glucose blood test strip Use as directed to check blood sugar once a day before breakfast   [DISCONTINUED] Vitamin D, Ergocalciferol, (DRISDOL) 1.25 MG (50000 UNIT) CAPS capsule Take 1 capsule (50,000 Units total) by mouth every 7 (seven) days.   No facility-administered medications prior to visit.    Review of Systems  Constitutional:  Negative for activity change, appetite change and unexpected weight change.  Respiratory: Negative.    Cardiovascular: Negative.   Gastrointestinal: Negative.   Endocrine: Negative for cold intolerance and heat intolerance.  Genitourinary: Negative.   Musculoskeletal: Negative.   Skin: Negative.   Neurological: Negative.   Hematological: Negative.   Psychiatric/Behavioral:  Negative for behavioral problems, decreased concentration, dysphoric mood, hallucinations, self-injury, sleep disturbance and suicidal ideas. The patient is not nervous/anxious.         Objective:  BP 120/80 (BP Location: Left Arm, Patient Position: Sitting, Cuff Size: Large)   Pulse 72   Temp 98 F (36.7 C) (Temporal)   Ht 5\' 7"  (1.702 m)    Wt (!) 317 lb 9.6 oz (144.1 kg)   SpO2 98%  BMI 49.74 kg/m     Physical Exam Vitals and nursing note reviewed.  Constitutional:      General: She is not in acute distress. HENT:     Right Ear: Tympanic membrane, ear canal and external ear normal.     Left Ear: Tympanic membrane, ear canal and external ear normal.     Nose: Nose normal.  Eyes:     Extraocular Movements: Extraocular movements intact.     Conjunctiva/sclera: Conjunctivae normal.     Pupils: Pupils are equal, round, and reactive to light.  Neck:     Thyroid: No thyroid mass, thyromegaly or thyroid tenderness.  Cardiovascular:     Rate and Rhythm: Normal rate and regular rhythm.     Pulses: Normal pulses.     Heart sounds: Normal heart sounds.  Pulmonary:     Effort: Pulmonary effort is normal.     Breath sounds: Normal breath sounds.  Abdominal:     General: Bowel sounds are normal.     Palpations: Abdomen is soft.  Musculoskeletal:        General: Normal range of motion.     Cervical back: Normal range of motion and neck supple.     Right lower leg: No edema.     Left lower leg: No edema.  Lymphadenopathy:     Cervical: No cervical adenopathy.  Skin:    General: Skin is warm and dry.  Neurological:     Mental Status: She is alert and oriented to person, place, and time.     Cranial Nerves: No cranial nerve deficit.  Psychiatric:        Mood and Affect: Mood normal.        Behavior: Behavior normal.        Thought Content: Thought content normal.      No results found for any visits on 04/08/23.    Assessment & Plan:    Routine Health Maintenance and Physical Exam  Immunization History  Administered Date(s) Administered   Influenza-Unspecified 10/24/2015, 11/01/2016, 10/22/2017, 10/16/2018, 11/11/2019, 11/12/2022   Tdap 12/28/2019   Health Maintenance  Topic Date Due   OPHTHALMOLOGY EXAM  Never done   COVID-19 Vaccine (1) 04/24/2023 (Originally 06/18/1990)   Hepatitis C Screening   06/25/2023 (Originally 06/19/2002)   Pneumococcal Vaccine 56-37 Years old (1 of 2 - PCV) 04/07/2024 (Originally 06/19/1990)   HEMOGLOBIN A1C  05/26/2023   Diabetic kidney evaluation - eGFR measurement  11/26/2023   Diabetic kidney evaluation - Urine ACR  11/26/2023   FOOT EXAM  04/07/2024   Cervical Cancer Screening (HPV/Pap Cotest)  03/14/2025   DTaP/Tdap/Td (2 - Td or Tdap) 12/27/2029   INFLUENZA VACCINE  Completed   HIV Screening  Completed   HPV VACCINES  Aged Out   Discussed health benefits of physical activity, and encouraged her to engage in regular exercise appropriate for her age and condition.  Problem List Items Addressed This Visit     Benign essential hypertension   BP at goal with metoprolol and amlodipine LE edema improved with DASH diet and use of compression stockings BP Readings from Last 3 Encounters:  04/08/23 120/80  12/10/22 (!) 151/102  11/26/22 128/89    Maintain med doses F/up in 6months      Hyperlipidemia associated with type 2 diabetes mellitus (HCC)   LdL at 123 Advised to maintain mediterranean diet      Relevant Medications   tirzepatide (MOUNJARO) 5 MG/0.5ML Pen   Long-term (current) use of injectable non-insulin antidiabetic  drugs   Relevant Medications   tirzepatide [facility]) 5 MG/0.5ML Pen   Morbid obesity (HCC)   She has established care with [facility] bariatric clinic for surgery. She has consultation with nutritionist. She has eliminated fast food in last 3weeks. She has been able to maintain 48oz of water daily, goal is 64oz. She continues to struggle with high fiber and high protein diet. Exercise: walking 2x/wee-22mins at a time. Reporst constipation with mounjaro, but improves with use of miralax. No weight loss noted in last 4months 2lbs weight gain in last 4months. Wt Readings from Last 3 Encounters:  04/08/23 (!) 317 lb 9.6 oz (144.1 kg)  12/10/22 (!) 318 lb (144.2 kg)  11/26/22 (!) 315 lb 12.8 oz (143.2 kg)    Maintain  mounjaro dose Signed form for bariatric surgery      Relevant Medications   tirzepatide [facility]) 5 MG/0.5ML Pen   Type 2 diabetes mellitus with hyperglycemia, with long-term current use of insulin (HCC)   Calculated UACr: 9.98mg /g Advised to schedule annual DIABETES eye exam Normal foot exam today BP at goal Constipation with mounjaro, relief with use of miralax. Reviewed labs completed by [facility] 03/20/2023: hgbA1c at 6%, LDL at 123, normal renal function.  Advised about her risk for CAD and need to improve LDL. Advised about importance of mediterranean diet and need to increase daily exercise. F/up in 3months       Relevant Medications   tirzepatide [facility]) 5 MG/0.5ML Pen   Other Visit Diagnoses       Encounter for preventative adult health care exam with abnormal findings    -  Primary      Return in about 6 months (around 10/09/2023) for HTN, DM, hyperlipidemia (fasting).     Alysia Penna, NP

## 2023-04-08 NOTE — Patient Instructions (Addendum)
 Schedule appointment with ophthalmology for annual DIABETES eye exam. You will be contacted once form is completed

## 2023-04-08 NOTE — Assessment & Plan Note (Signed)
 LdL at 123 Advised to maintain mediterranean diet

## 2023-04-15 ENCOUNTER — Other Ambulatory Visit (HOSPITAL_COMMUNITY): Payer: Self-pay

## 2023-04-15 ENCOUNTER — Other Ambulatory Visit: Payer: Self-pay

## 2023-04-15 ENCOUNTER — Other Ambulatory Visit: Payer: Self-pay | Admitting: Gastroenterology

## 2023-04-15 MED ORDER — PANTOPRAZOLE SODIUM 40 MG PO TBEC
40.0000 mg | DELAYED_RELEASE_TABLET | Freq: Every day | ORAL | 3 refills | Status: DC
  Filled 2023-04-15: qty 90, 90d supply, fill #0

## 2023-04-15 MED ORDER — PANTOPRAZOLE SODIUM 40 MG PO TBEC
40.0000 mg | DELAYED_RELEASE_TABLET | Freq: Every day | ORAL | 3 refills | Status: AC
  Filled 2023-04-15: qty 30, 30d supply, fill #0
  Filled 2023-05-16: qty 30, 30d supply, fill #1
  Filled 2023-06-28: qty 30, 30d supply, fill #2
  Filled 2023-07-31: qty 30, 30d supply, fill #3
  Filled 2023-09-23: qty 30, 30d supply, fill #4
  Filled 2023-10-29: qty 30, 30d supply, fill #5
  Filled 2023-12-19: qty 30, 30d supply, fill #6

## 2023-05-16 ENCOUNTER — Other Ambulatory Visit: Payer: Self-pay

## 2023-05-16 ENCOUNTER — Other Ambulatory Visit (HOSPITAL_COMMUNITY): Payer: Self-pay

## 2023-06-15 ENCOUNTER — Telehealth: Admitting: Physician Assistant

## 2023-06-15 DIAGNOSIS — B379 Candidiasis, unspecified: Secondary | ICD-10-CM

## 2023-06-15 DIAGNOSIS — T3695XA Adverse effect of unspecified systemic antibiotic, initial encounter: Secondary | ICD-10-CM | POA: Diagnosis not present

## 2023-06-15 MED ORDER — FLUCONAZOLE 150 MG PO TABS: 150.0000 mg | ORAL_TABLET | ORAL | 0 refills | Status: DC | PRN

## 2023-06-15 NOTE — Progress Notes (Signed)

## 2023-10-23 ENCOUNTER — Other Ambulatory Visit (HOSPITAL_COMMUNITY): Payer: Self-pay

## 2023-10-23 ENCOUNTER — Telehealth: Admitting: Physician Assistant

## 2023-10-23 DIAGNOSIS — J019 Acute sinusitis, unspecified: Secondary | ICD-10-CM

## 2023-10-23 DIAGNOSIS — R051 Acute cough: Secondary | ICD-10-CM | POA: Diagnosis not present

## 2023-10-23 DIAGNOSIS — B9689 Other specified bacterial agents as the cause of diseases classified elsewhere: Secondary | ICD-10-CM

## 2023-10-23 MED ORDER — DOXYCYCLINE HYCLATE 100 MG PO TABS
100.0000 mg | ORAL_TABLET | Freq: Two times a day (BID) | ORAL | 0 refills | Status: DC
  Filled 2023-10-23: qty 14, 7d supply, fill #0

## 2023-10-23 MED ORDER — BENZONATATE 100 MG PO CAPS
100.0000 mg | ORAL_CAPSULE | Freq: Three times a day (TID) | ORAL | 0 refills | Status: DC | PRN
  Filled 2023-10-23: qty 30, 5d supply, fill #0

## 2023-10-23 NOTE — Progress Notes (Signed)
 E-Visit for Sinus Problems  We are sorry that you are not feeling well.  Here is how we plan to help!  Based on what you have shared with me it looks like you have sinusitis.  Sinusitis is inflammation and infection in the sinus cavities of the head.  Based on your presentation I believe you most likely have Acute Bacterial Sinusitis.  This is an infection caused by bacteria and is treated with antibiotics. I have prescribed Doxycycline  100mg  by mouth twice a day for 7 days. I have also prescribed Tessalon  perles 100mg  for cough. Take 1-2 capsules every 8 hours as needed. You may use an oral decongestant such as Mucinex D or if you have glaucoma or high blood pressure use plain Mucinex. Saline nasal spray help and can safely be used as often as needed for congestion.  If you develop worsening sinus pain, fever or notice severe headache and vision changes, or if symptoms are not better after completion of antibiotic, please schedule an appointment with a health care provider.    Sinus infections are not as easily transmitted as other respiratory infection, however we still recommend that you avoid close contact with loved ones, especially the very young and elderly.  Remember to wash your hands thoroughly throughout the day as this is the number one way to prevent the spread of infection!  Home Care: Only take medications as instructed by your medical team. Complete the entire course of an antibiotic. Do not take these medications with alcohol. A steam or ultrasonic humidifier can help congestion.  You can place a towel over your head and breathe in the steam from hot water  coming from a faucet. Avoid close contacts especially the very young and the elderly. Cover your mouth when you cough or sneeze. Always remember to wash your hands.  Get Help Right Away If: You develop worsening fever or sinus pain. You develop a severe head ache or visual changes. Your symptoms persist after you have  completed your treatment plan.  Make sure you Understand these instructions. Will watch your condition. Will get help right away if you are not doing well or get worse.  Your e-visit answers were reviewed by a board certified advanced clinical practitioner to complete your personal care plan.  Depending on the condition, your plan could have included both over the counter or prescription medications.  If there is a problem please reply  once you have received a response from your provider.  Your safety is important to us .  If you have drug allergies check your prescription carefully.    You can use MyChart to ask questions about today's visit, request a non-urgent call back, or ask for a work or school excuse for 24 hours related to this e-Visit. If it has been greater than 24 hours you will need to follow up with your provider, or enter a new e-Visit to address those concerns.  You will get an e-mail in the next two days asking about your experience.  I hope that your e-visit has been valuable and will speed your recovery. Thank you for using e-visits.  I have spent 5 minutes in review of e-visit questionnaire, review and updating patient chart, medical decision making and response to patient.   Cheryl CHRISTELLA Dickinson, PA-C

## 2023-10-29 ENCOUNTER — Other Ambulatory Visit (HOSPITAL_COMMUNITY): Payer: Self-pay

## 2023-12-10 ENCOUNTER — Telehealth: Admitting: Physician Assistant

## 2023-12-10 DIAGNOSIS — B3731 Acute candidiasis of vulva and vagina: Secondary | ICD-10-CM

## 2023-12-10 MED ORDER — FLUCONAZOLE 150 MG PO TABS: 150.0000 mg | ORAL_TABLET | Freq: Once | ORAL | 0 refills | Status: AC

## 2023-12-10 NOTE — Progress Notes (Signed)

## 2024-01-23 ENCOUNTER — Ambulatory Visit: Admitting: Gastroenterology
